# Patient Record
Sex: Male | Born: 1960
Health system: Southern US, Community
[De-identification: ages and names within clinical notes are randomized; demographics above are authoritative.]

## PROBLEM LIST (undated history)

## (undated) DIAGNOSIS — R251 Tremor, unspecified: Secondary | ICD-10-CM

## (undated) DIAGNOSIS — I10 Essential (primary) hypertension: Secondary | ICD-10-CM

## (undated) DIAGNOSIS — M1990 Unspecified osteoarthritis, unspecified site: Secondary | ICD-10-CM

## (undated) DIAGNOSIS — Z8619 Personal history of other infectious and parasitic diseases: Secondary | ICD-10-CM

## (undated) DIAGNOSIS — G4730 Sleep apnea, unspecified: Secondary | ICD-10-CM

## (undated) DIAGNOSIS — K219 Gastro-esophageal reflux disease without esophagitis: Secondary | ICD-10-CM

## (undated) DIAGNOSIS — T4145XA Adverse effect of unspecified anesthetic, initial encounter: Secondary | ICD-10-CM

## (undated) DIAGNOSIS — T8859XA Other complications of anesthesia, initial encounter: Secondary | ICD-10-CM

## (undated) DIAGNOSIS — N400 Enlarged prostate without lower urinary tract symptoms: Secondary | ICD-10-CM

## (undated) DIAGNOSIS — H269 Unspecified cataract: Secondary | ICD-10-CM

## (undated) DIAGNOSIS — R519 Headache, unspecified: Secondary | ICD-10-CM

## (undated) DIAGNOSIS — I639 Cerebral infarction, unspecified: Secondary | ICD-10-CM

## (undated) DIAGNOSIS — Z87442 Personal history of urinary calculi: Secondary | ICD-10-CM

## (undated) DIAGNOSIS — R51 Headache: Secondary | ICD-10-CM

## (undated) HISTORY — DX: Unspecified cataract: H26.9

## (undated) HISTORY — PX: REPLACEMENT TOTAL KNEE: SUR1224

## (undated) HISTORY — DX: Personal history of other infectious and parasitic diseases: Z86.19

## (undated) HISTORY — DX: Essential (primary) hypertension: I10

## (undated) HISTORY — DX: Sleep apnea, unspecified: G47.30

---

## 1992-08-09 HISTORY — PX: CYSTOSCOPY: SUR368

## 1998-08-09 DIAGNOSIS — Z91038 Other insect allergy status: Secondary | ICD-10-CM | POA: Insufficient documentation

## 1998-08-09 HISTORY — PX: VASECTOMY: SHX75

## 2004-10-01 ENCOUNTER — Ambulatory Visit: Payer: Self-pay | Admitting: Family Medicine

## 2004-10-12 ENCOUNTER — Ambulatory Visit: Payer: Self-pay | Admitting: Urology

## 2006-07-30 ENCOUNTER — Emergency Department: Payer: Self-pay | Admitting: Emergency Medicine

## 2007-03-06 ENCOUNTER — Ambulatory Visit: Payer: Self-pay | Admitting: Emergency Medicine

## 2007-08-11 ENCOUNTER — Emergency Department: Payer: Self-pay | Admitting: Emergency Medicine

## 2010-09-18 ENCOUNTER — Ambulatory Visit: Payer: Self-pay | Admitting: Family Medicine

## 2010-09-25 ENCOUNTER — Ambulatory Visit: Payer: Self-pay | Admitting: Family Medicine

## 2013-01-09 DIAGNOSIS — Z86010 Personal history of colonic polyps: Secondary | ICD-10-CM | POA: Insufficient documentation

## 2013-01-09 LAB — HM COLONOSCOPY

## 2013-03-06 ENCOUNTER — Ambulatory Visit: Payer: Self-pay | Admitting: Surgery

## 2013-03-06 LAB — CBC WITH DIFFERENTIAL/PLATELET
Basophil %: 0.6 %
Eosinophil #: 0.2 10*3/uL (ref 0.0–0.7)
Eosinophil %: 2.3 %
HCT: 40.5 % (ref 40.0–52.0)
HGB: 14 g/dL (ref 13.0–18.0)
Lymphocyte %: 27.5 %
MCH: 31.1 pg (ref 26.0–34.0)
Monocyte %: 5.7 %
Neutrophil %: 63.9 %
Platelet: 179 10*3/uL (ref 150–440)
RDW: 13.7 % (ref 11.5–14.5)
WBC: 7.5 10*3/uL (ref 3.8–10.6)

## 2013-03-06 LAB — BASIC METABOLIC PANEL
Anion Gap: 8 (ref 7–16)
BUN: 13 mg/dL (ref 7–18)
Chloride: 106 mmol/L (ref 98–107)
Co2: 27 mmol/L (ref 21–32)
EGFR (African American): >60
EGFR (Non-African Amer.): >60
Glucose: 110 mg/dL — ABNORMAL HIGH (ref 65–99)
Osmolality: 282 (ref 275–301)
Sodium: 141 mmol/L (ref 136–145)

## 2013-03-12 ENCOUNTER — Ambulatory Visit: Payer: Self-pay | Admitting: Surgery

## 2013-03-12 HISTORY — PX: HERNIA REPAIR: SHX51

## 2013-12-04 ENCOUNTER — Emergency Department: Payer: Self-pay | Admitting: Emergency Medicine

## 2013-12-04 LAB — COMPREHENSIVE METABOLIC PANEL
ALBUMIN: 4.3 g/dL (ref 3.4–5.0)
ALT: 28 U/L (ref 12–78)
Alkaline Phosphatase: 46 U/L
Anion Gap: 5 — ABNORMAL LOW (ref 7–16)
BUN: 12 mg/dL (ref 7–18)
Bilirubin,Total: 0.6 mg/dL (ref 0.2–1.0)
CHLORIDE: 105 mmol/L (ref 98–107)
CREATININE: 0.86 mg/dL (ref 0.60–1.30)
Calcium, Total: 9.2 mg/dL (ref 8.5–10.1)
Co2: 31 mmol/L (ref 21–32)
EGFR (African American): >60
EGFR (Non-African Amer.): >60
Glucose: 83 mg/dL (ref 65–99)
OSMOLALITY: 280 (ref 275–301)
Potassium: 3.6 mmol/L (ref 3.5–5.1)
SGOT(AST): 17 U/L (ref 15–37)
Sodium: 141 mmol/L (ref 136–145)
TOTAL PROTEIN: 7.6 g/dL (ref 6.4–8.2)

## 2013-12-04 LAB — CBC
HCT: 44.6 % (ref 40.0–52.0)
HGB: 14.5 g/dL (ref 13.0–18.0)
MCH: 29.6 pg (ref 26.0–34.0)
MCHC: 32.4 g/dL (ref 32.0–36.0)
MCV: 91 fL (ref 80–100)
Platelet: 201 10*3/uL (ref 150–440)
RBC: 4.89 10*6/uL (ref 4.40–5.90)
RDW: 13.5 % (ref 11.5–14.5)
WBC: 9.2 10*3/uL (ref 3.8–10.6)

## 2013-12-04 LAB — MAGNESIUM: Magnesium: 2 mg/dL

## 2013-12-04 LAB — TROPONIN I: Troponin-I: <0.02 ng/mL

## 2013-12-28 ENCOUNTER — Ambulatory Visit: Payer: Self-pay | Admitting: Family Medicine

## 2013-12-28 HISTORY — PX: CARDIOVASCULAR STRESS TEST: SHX262

## 2014-11-29 NOTE — Op Note (Signed)
PATIENT NAME:  Rodney Rojas, Rodney Rojas MR#:  [id number] DATE OF BIRTH:  March 24, 1961  DATE OF PROCEDURE:  03/12/2013  PREOPERATIVE DIAGNOSIS: Umbilical hernia.   POSTOPERATIVE DIAGNOSIS: Umbilical hernia.   OPERATION: Umbilical hernia repair.   ANESTHESIA: General, with LMA.   SURGEON: Dr. Pat Patrick.   ASSISTANT: Rainbolt, PA student. Luis Abed PA student.   ANESTHESIA: General.   OPERATIVE PROCEDURE: With the patient in the supine position after the induction of appropriate general anesthesia, the patient's abdomen was prepped with ChloraPrep and draped with sterile towels. An infraumbilical incision was made in the standard fashion, carried down bluntly through the subcutaneous tissue. The umbilical skin was separated from the sac and the sac was dissected back to its base along the fascia. The defect itself was quite small and had to be slightly enlarged to allow for decompression of the omentum and preperitoneal fat. No sac was removed for specimen.   The edges of the fascia were cleaned. The defect was then closed in a pants-over-vest closure using 0 Surgilon. The edges were then sutured over to provide a double-layer closure. The area was infiltrated with 0.25% Marcaine for postoperative pain control. Umbilical skin was reapproximated with 0 Vicryl and the skin was clipped. Sterile dressings were applied. The patient was returned to the recovery room, having tolerated the procedure well. Sponge, instrument and needle counts were correct x 2 in the operating room.     ____________________________ [name], MD rle:dm D: 03/12/2013 12:22:29 ET T: 03/12/2013 13:31:52 ET JOB#: [id number]  cc: Micheline Maze, MD, <Dictator> Kirstie Peri. Caryn Section, MD Rodena Goldmann MD ELECTRONICALLY SIGNED 03/13/2013 15:14

## 2015-02-24 ENCOUNTER — Encounter: Payer: Self-pay | Admitting: Family Medicine

## 2015-02-24 ENCOUNTER — Ambulatory Visit (INDEPENDENT_AMBULATORY_CARE_PROVIDER_SITE_OTHER): Payer: BLUE CROSS/BLUE SHIELD | Admitting: Family Medicine

## 2015-02-24 VITALS — BP 116/70 | HR 61 | Temp 98.1°F | Resp 16 | Ht 70.0 in | Wt 219.0 lb

## 2015-02-24 DIAGNOSIS — B356 Tinea cruris: Secondary | ICD-10-CM

## 2015-02-24 DIAGNOSIS — R3911 Hesitancy of micturition: Secondary | ICD-10-CM | POA: Insufficient documentation

## 2015-02-24 DIAGNOSIS — M25562 Pain in left knee: Secondary | ICD-10-CM

## 2015-02-24 DIAGNOSIS — K219 Gastro-esophageal reflux disease without esophagitis: Secondary | ICD-10-CM | POA: Insufficient documentation

## 2015-02-24 DIAGNOSIS — Z91038 Other insect allergy status: Secondary | ICD-10-CM | POA: Diagnosis not present

## 2015-02-24 DIAGNOSIS — G8929 Other chronic pain: Secondary | ICD-10-CM | POA: Insufficient documentation

## 2015-02-24 DIAGNOSIS — G250 Essential tremor: Secondary | ICD-10-CM | POA: Diagnosis not present

## 2015-02-24 MED ORDER — EPINEPHRINE 0.3 MG/0.3ML IJ SOAJ: 0.3000 mg | Freq: Once | INTRAMUSCULAR | Status: DC

## 2015-02-24 MED ORDER — PRIMIDONE 50 MG PO TABS: 50.0000 mg | ORAL_TABLET | Freq: Every evening | ORAL | Status: DC | PRN

## 2015-02-24 MED ORDER — CEPHALEXIN 500 MG PO CAPS: 500.0000 mg | ORAL_CAPSULE | Freq: Four times a day (QID) | ORAL | Status: AC

## 2015-02-24 MED ORDER — BISOPROLOL FUMARATE 10 MG PO TABS
10.0000 mg | ORAL_TABLET | Freq: Every day | ORAL | Status: DC
Start: 2015-02-24 — End: 2015-04-27

## 2015-02-24 NOTE — Progress Notes (Signed)
       Patient: Rodney Rojas Male    DOB: 29-Jul-1961   54 y.o.   MRN: [id number] Visit Date: 02/24/2015  Today's Provider: Lelon Huh, MD   Chief Complaint  Patient presents with  . Rash   Subjective:    Rash This is a new problem. The current episode started in the past 7 days. The problem is unchanged. The affected locations include the genitalia. The rash is characterized by itchiness and redness. Pertinent negatives include no fever or shortness of breath.   He states he has been traveling a lot, and got some time of chemical on his pants before rash started last week. It has been very itchy and he has been using Gold Bond powder which helps. He started using Lotrimin last night and hasn't noticed any difference yet. Rash affects the skin of his scrotal sac only, is not spreading and is not draining.   Follow up Tremors: States current combination of betablocker and primidone is working well and needs a prescription.   Allergies  Allergen Reactions  . Bee Venom     Bee stings   Previous Medications   BISOPROLOL (ZEBETA) 10 MG TABLET    Take 1 tablet by mouth daily.   EPINEPHRINE (EPIPEN 2-PAK) 0.3 MG/0.3 ML IJ SOAJ INJECTION    Inject 0.3 mg into the muscle once.   EPINEPHRINE, ANAPHYLAXIS THERAPY AGENTS,    1 injection  Used as directed by physician   PRIMIDONE (MYSOLINE) 50 MG TABLET    Take 1-2 tablets by mouth at bedtime as needed.   TADALAFIL (CIALIS) 5 MG TABLET    Take 1 tablet by mouth daily as needed.   TESTOSTERONE IM        Review of Systems  Constitutional: Negative for fever.  Respiratory: Negative for shortness of breath.   Cardiovascular: Negative for chest pain.  Skin: Positive for rash.    History  Substance Use Topics  . Smoking status: Never Smoker   . Smokeless tobacco: Not on file  . Alcohol Use: 0.0 oz/week    0 Standard drinks or equivalent per week     Comment: occasional use   Objective:   BP 116/70 mmHg  Pulse 61  Temp(Src)  98.1 F (36.7 C) (Oral)  Resp 16  Ht 5\' 10"  (1.778 m)  Wt 219 lb (99.338 kg)  BMI 31.42 kg/m2  SpO2 97%  Physical Exam Scrotal sac is erythematous as described in HPI. No discharge. No Satellite lesions.      Assessment & Plan:     1. Tinea cruris Continue OTC lotrimen and call if not resolved by end of week. Cover for possible secondary bacterial infection.  - cephALEXin (KEFLEX) 500 MG capsule; Take 1 capsule (500 mg total) by mouth 4 (four) times daily.  Dispense: 28 capsule; Refill: 0  2. Benign essential tremor well controlled Refill meds - primidone (MYSOLINE) 50 MG tablet; Take 1-2 tablets (50-100 mg total) by mouth at bedtime as needed.  Dispense: 60 tablet; Refill: 5 - bisoprolol (ZEBETA) 10 MG tablet; Take 1 tablet (10 mg total) by mouth daily.  Dispense: 30 tablet; Refill: 1  3. Allergy to insects and arachnids  - EPINEPHrine (EPIPEN 2-PAK) 0.3 mg/0.3 mL IJ SOAJ injection; Inject 0.3 mLs (0.3 mg total) into the muscle once.  Dispense: 2 Device; Refill: 1        Lelon Huh, MD  [facility]

## 2015-02-24 NOTE — Patient Instructions (Signed)
Continue using OTC Lotrimin cream for 7 days. Call if not much better by the end of the week.

## 2015-02-28 ENCOUNTER — Ambulatory Visit (INDEPENDENT_AMBULATORY_CARE_PROVIDER_SITE_OTHER): Payer: BLUE CROSS/BLUE SHIELD | Admitting: Family Medicine

## 2015-02-28 ENCOUNTER — Encounter: Payer: Self-pay | Admitting: Family Medicine

## 2015-02-28 VITALS — BP 102/60 | HR 51 | Temp 98.8°F | Resp 16 | Ht 70.0 in | Wt 219.0 lb

## 2015-02-28 DIAGNOSIS — B356 Tinea cruris: Secondary | ICD-10-CM

## 2015-02-28 MED ORDER — KETOCONAZOLE 2 % EX CREA: 1.0000 "application " | TOPICAL_CREAM | Freq: Every day | CUTANEOUS | Status: DC

## 2015-02-28 NOTE — Progress Notes (Signed)
       Patient: Rodney Rojas Male    DOB: 12-Mar-1961   54 y.o.   MRN: [id number] Visit Date: 02/28/2015  Today's Provider: Lelon Huh, MD   Chief Complaint  Patient presents with  . Follow-up  . Rash   Subjective:    Rash This is a recurrent problem. The current episode started in the past 7 days. The problem has been gradually improving since onset. The affected locations include the groin. The rash is characterized by bruising.    Follow-up for Rash (Tinea Cruris) in groin area from 02/24/2015. Patient to continue OTC Lotrimin started on Keflex to cover for secondary bacterial infection. He states that skin in scotum started to swell and itch even worse yesterday and the day before, but he got up this morning and everything was back to normal. He states he had been scratching the area a lot because it was itching so back, but the itching is also much bette today. He has been working out of town, but states he has been mostly in air-conditioned environment and not out in the heat. . Advised to call office if rash not better by the end of week.    Allergies  Allergen Reactions  . Bee Venom     Bee stings   Previous Medications   BISOPROLOL (ZEBETA) 10 MG TABLET    Take 1 tablet (10 mg total) by mouth daily.   CEPHALEXIN (KEFLEX) 500 MG CAPSULE    Take 1 capsule (500 mg total) by mouth 4 (four) times daily.   EPINEPHRINE (EPIPEN 2-PAK) 0.3 MG/0.3 ML IJ SOAJ INJECTION    Inject 0.3 mLs (0.3 mg total) into the muscle once.   EPINEPHRINE, ANAPHYLAXIS THERAPY AGENTS,    1 injection  Used as directed by physician   PRIMIDONE (MYSOLINE) 50 MG TABLET    Take 1-2 tablets (50-100 mg total) by mouth at bedtime as needed.   TADALAFIL (CIALIS) 5 MG TABLET    Take 1 tablet by mouth daily as needed.   TESTOSTERONE IM        Review of Systems  Cardiovascular: Negative for chest pain and palpitations.  Skin: Positive for rash.  Neurological: Negative for dizziness, light-headedness and  headaches.    History  Substance Use Topics  . Smoking status: Never Smoker   . Smokeless tobacco: Not on file  . Alcohol Use: 0.0 oz/week    0 Standard drinks or equivalent per week     Comment: occasional use   Objective:   BP 102/60 mmHg  Pulse 51  Temp(Src) 98.8 F (37.1 C) (Oral)  Resp 16  Ht 5\' 10"  (1.778 m)  Wt 219 lb (99.338 kg)  BMI 31.42 kg/m2  SpO2 97%  Physical Exam      Assessment & Plan:      1. Tinea cruris Symptoms resolved since yesterday. He is to finish cephalexin and continue lotrimin. Start ketoconazole if symptoms return.  - ketoconazole (NIZORAL) 2 % cream; Apply 1 application topically daily.  Dispense: 15 g; Refill: 0       Lelon Huh, MD  [facility]

## 2015-04-27 ENCOUNTER — Other Ambulatory Visit: Payer: Self-pay | Admitting: Family Medicine

## 2015-08-05 ENCOUNTER — Ambulatory Visit (INDEPENDENT_AMBULATORY_CARE_PROVIDER_SITE_OTHER): Payer: BLUE CROSS/BLUE SHIELD | Admitting: Family Medicine

## 2015-08-05 ENCOUNTER — Encounter: Payer: Self-pay | Admitting: Family Medicine

## 2015-08-05 VITALS — BP 142/80 | HR 58 | Temp 98.0°F | Resp 16 | Wt 220.0 lb

## 2015-08-05 DIAGNOSIS — I499 Cardiac arrhythmia, unspecified: Secondary | ICD-10-CM | POA: Diagnosis not present

## 2015-08-05 DIAGNOSIS — R002 Palpitations: Secondary | ICD-10-CM | POA: Diagnosis not present

## 2015-08-05 NOTE — Progress Notes (Signed)
Patient ID: Rodney Rojas, male   DOB: 06-Nov-1960, 54 y.o.   MRN: [id number]       Patient: Rodney Rojas Male    DOB: 03-Apr-1961   54 y.o.   MRN: [id number] Visit Date: 08/05/2015  Today's Provider: Lelon Huh, MD   Chief Complaint  Patient presents with  . Irregular Heart Beat    with chest discomfort   Subjective:    HPI Pt reports that 2 days ago he started having this " funny" feeling in his chest. " Almost like a palpitation" He reports that he had been lifting a lot of trees and branches and thought it was muscular at first. Then one night he could not get comfortable and he started to go to the ER but he ended up getting comfortable and going to sleep. The pain/discomfort is located on the left side of his chest. He also reports that he has been very thirsty and burping a lot, even when he drinks water. He denies any shortness of breath, sweats, arm pain, jaw pain etc. He has no history of heart related problems, but he does have a family history, his father had CABG x3 at about 28.    Allergies  Allergen Reactions  . Bee Venom     Bee stings   Previous Medications   BISOPROLOL (ZEBETA) 10 MG TABLET    take 1 tablet by mouth once daily   EPINEPHRINE (EPIPEN 2-PAK) 0.3 MG/0.3 ML IJ SOAJ INJECTION    Inject 0.3 mLs (0.3 mg total) into the muscle once.   KETOCONAZOLE (NIZORAL) 2 % CREAM    Apply 1 application topically daily.   PRIMIDONE (MYSOLINE) 50 MG TABLET    Take 1-2 tablets (50-100 mg total) by mouth at bedtime as needed.   TADALAFIL (CIALIS) 5 MG TABLET    Take 1 tablet by mouth daily as needed.   TAMSULOSIN (FLOMAX) 0.4 MG CAPS CAPSULE    take 1 capsule by mouth once daily 1/2 HOUR after A MEAL   TESTOSTERONE IM    Reported on 08/05/2015    Review of Systems  Constitutional: Negative.   HENT: Negative.   Eyes: Negative.   Respiratory: Negative.   Cardiovascular: Positive for chest pain.  Gastrointestinal:       Heart burn  Endocrine: Negative.     Genitourinary: Negative.   Musculoskeletal: Negative.   Skin: Negative.   Allergic/Immunologic: Negative.   Neurological: Positive for tremors.  Hematological: Negative.   Psychiatric/Behavioral: Negative.     Social History  Substance Use Topics  . Smoking status: Never Smoker   . Smokeless tobacco: Not on file  . Alcohol Use: 0.0 oz/week    0 Standard drinks or equivalent per week     Comment: occasional use   Objective:   BP 142/80 mmHg  Pulse 58  Temp(Src) 98 F (36.7 C) (Oral)  Resp 16  Wt 220 lb (99.791 kg)  Physical Exam   General Appearance:    Alert, cooperative, no distress  Eyes:    PERRL, conjunctiva/corneas clear, EOM's intact       Lungs:     Clear to auscultation bilaterally, respirations unlabored  Heart:    Regular rate and rhythm  Neurologic:   Awake, alert, oriented x 3. No apparent focal neurological           defect.      ECG: Sinus  Bradycardia  - occasional ectopic ventricular beat    WITHIN NORMAL LIMITS  Assessment & Plan:     1. Intermittent palpitations  - EKG 12-Lead  24 hour Holter monitor.      Lelon Huh, MD  [facility]

## 2015-08-08 ENCOUNTER — Ambulatory Visit
Admission: RE | Admit: 2015-08-08 | Discharge: 2015-08-08 | Disposition: A | Discharge status: Home / Self Care | Payer: BLUE CROSS/BLUE SHIELD | Source: Ambulatory Visit | Attending: Family Medicine | Admitting: Family Medicine

## 2015-08-08 ENCOUNTER — Ambulatory Visit: Admission: RE | Admit: 2015-08-08 | Payer: BLUE CROSS/BLUE SHIELD | Source: Ambulatory Visit

## 2015-08-08 ENCOUNTER — Inpatient Hospital Stay: Admission: RE | Admit: 2015-08-08 | Payer: BLUE CROSS/BLUE SHIELD | Source: Ambulatory Visit

## 2015-08-08 DIAGNOSIS — R002 Palpitations: Secondary | ICD-10-CM | POA: Diagnosis not present

## 2015-08-11 ENCOUNTER — Emergency Department
Admission: EM | Admit: 2015-08-11 | Discharge: 2015-08-11 | Disposition: A | Discharge status: Home / Self Care | Payer: BLUE CROSS/BLUE SHIELD | Attending: Emergency Medicine | Admitting: Emergency Medicine

## 2015-08-11 DIAGNOSIS — R109 Unspecified abdominal pain: Secondary | ICD-10-CM | POA: Insufficient documentation

## 2015-08-11 DIAGNOSIS — R197 Diarrhea, unspecified: Secondary | ICD-10-CM | POA: Diagnosis not present

## 2015-08-11 DIAGNOSIS — R112 Nausea with vomiting, unspecified: Secondary | ICD-10-CM

## 2015-08-11 DIAGNOSIS — Z79899 Other long term (current) drug therapy: Secondary | ICD-10-CM | POA: Diagnosis not present

## 2015-08-11 LAB — CBC
HEMATOCRIT: 42 % (ref 40.0–52.0)
HEMOGLOBIN: 14.3 g/dL (ref 13.0–18.0)
MCH: 31.1 pg (ref 26.0–34.0)
MCHC: 34 g/dL (ref 32.0–36.0)
MCV: 91.4 fL (ref 80.0–100.0)
PLATELETS: 146 10*3/uL — AB (ref 150–440)
RBC: 4.59 MIL/uL (ref 4.40–5.90)
RDW: 13.5 % (ref 11.5–14.5)
WBC: 10.8 10*3/uL — AB (ref 3.8–10.6)

## 2015-08-11 LAB — COMPREHENSIVE METABOLIC PANEL
ALBUMIN: 4.9 g/dL (ref 3.5–5.0)
ALK PHOS: 38 U/L (ref 38–126)
ALT: 21 U/L (ref 17–63)
ANION GAP: 9 (ref 5–15)
AST: 22 U/L (ref 15–41)
BILIRUBIN TOTAL: 1.5 mg/dL — AB (ref 0.3–1.2)
BUN: 17 mg/dL (ref 6–20)
CALCIUM: 9.1 mg/dL (ref 8.9–10.3)
CO2: 24 mmol/L (ref 22–32)
Chloride: 105 mmol/L (ref 101–111)
Creatinine, Ser: 0.86 mg/dL (ref 0.61–1.24)
GFR calc Af Amer: >60 mL/min (ref >60)
GLUCOSE: 145 mg/dL — AB (ref 65–99)
Potassium: 4 mmol/L (ref 3.5–5.1)
Sodium: 138 mmol/L (ref 135–145)
TOTAL PROTEIN: 7.4 g/dL (ref 6.5–8.1)

## 2015-08-11 LAB — LIPASE, BLOOD: Lipase: 22 U/L (ref 11–51)

## 2015-08-11 MED ORDER — ONDANSETRON 4 MG PO TBDP
4.0000 mg | ORAL_TABLET | Freq: Once | ORAL | Status: AC | PRN
  Administered 2015-08-11: 4 mg via ORAL

## 2015-08-11 MED ORDER — ONDANSETRON 4 MG PO TBDP
ORAL_TABLET | ORAL | Status: AC
  Administered 2015-08-11: 4 mg via ORAL
  Filled 2015-08-11: qty 1

## 2015-08-11 MED ORDER — ONDANSETRON 4 MG PO TBDP: 4.0000 mg | ORAL_TABLET | Freq: Three times a day (TID) | ORAL | Status: DC | PRN

## 2015-08-11 MED ORDER — SODIUM CHLORIDE 0.9 % IV BOLUS (SEPSIS)
1000.0000 mL | Freq: Once | INTRAVENOUS | Status: AC
  Administered 2015-08-11: 1000 mL via INTRAVENOUS

## 2015-08-11 NOTE — ED Notes (Signed)
Pt reports n/v/d x 7 hours.  Pt reports vomiting x 7 and diarrhea x 7.  Pt reports weakness and dizziness directly after episodes, pt currently feeling better.  Pt reports mild headache at this time.  Pt reports last episode of vomiting and diarrhea around 11:30pm.  Pt NAD at this time, respirations equal and unlabored, skin warm and dry.

## 2015-08-11 NOTE — ED Notes (Addendum)
Patient ambulatory to triage with steady gait, without difficulty or distress noted; pt reports N/V/D since 530pm with intermittent lower abd cramping

## 2015-08-11 NOTE — Discharge Instructions (Signed)
1. Take nausea medicine as needed (Zofran #20). 2. Clear liquids x 12 hours, then bland diet x 3 days, then slowly advance diet as tolerated. 3. Return to the ER for worsening symptoms, persistent vomiting, fever, difficulty breathing or other concerns.  Nausea and Vomiting Nausea is a sick feeling that often comes before throwing up (vomiting). Vomiting is a reflex where stomach contents come out of your mouth. Vomiting can cause severe loss of body fluids (dehydration). Children and elderly adults can become dehydrated quickly, especially if they also have diarrhea. Nausea and vomiting are symptoms of a condition or disease. It is important to find the cause of your symptoms. CAUSES   Direct irritation of the stomach lining. This irritation can result from increased acid production (gastroesophageal reflux disease), infection, food poisoning, taking certain medicines (such as nonsteroidal anti-inflammatory drugs), alcohol use, or tobacco use.  Signals from the brain.These signals could be caused by a headache, heat exposure, an inner ear disturbance, increased pressure in the brain from injury, infection, a tumor, or a concussion, pain, emotional stimulus, or metabolic problems.  An obstruction in the gastrointestinal tract (bowel obstruction).  Illnesses such as diabetes, hepatitis, gallbladder problems, appendicitis, kidney problems, cancer, sepsis, atypical symptoms of a heart attack, or eating disorders.  Medical treatments such as chemotherapy and radiation.  Receiving medicine that makes you sleep (general anesthetic) during surgery. DIAGNOSIS Your caregiver may ask for tests to be done if the problems do not improve after a few days. Tests may also be done if symptoms are severe or if the reason for the nausea and vomiting is not clear. Tests may include:  Urine tests.  Blood tests.  Stool tests.  Cultures (to look for evidence of infection).  X-rays or other imaging  studies. Test results can help your caregiver make decisions about treatment or the need for additional tests. TREATMENT You need to stay well hydrated. Drink frequently but in small amounts.You may wish to drink water, sports drinks, clear broth, or eat frozen ice pops or gelatin dessert to help stay hydrated.When you eat, eating slowly may help prevent nausea.There are also some antinausea medicines that may help prevent nausea. HOME CARE INSTRUCTIONS   Take all medicine as directed by your caregiver.  If you do not have an appetite, do not force yourself to eat. However, you must continue to drink fluids.  If you have an appetite, eat a normal diet unless your caregiver tells you differently.  Eat a variety of complex carbohydrates (rice, wheat, potatoes, bread), lean meats, yogurt, fruits, and vegetables.  Avoid high-fat foods because they are more difficult to digest.  Drink enough water and fluids to keep your urine clear or pale yellow.  If you are dehydrated, ask your caregiver for specific rehydration instructions. Signs of dehydration may include:  Severe thirst.  Dry lips and mouth.  Dizziness.  Dark urine.  Decreasing urine frequency and amount.  Confusion.  Rapid breathing or pulse. SEEK IMMEDIATE MEDICAL CARE IF:   You have blood or brown flecks (like coffee grounds) in your vomit.  You have black or bloody stools.  You have a severe headache or stiff neck.  You are confused.  You have severe abdominal pain.  You have chest pain or trouble breathing.  You do not urinate at least once every 8 hours.  You develop cold or clammy skin.  You continue to vomit for longer than 24 to 48 hours.  You have a fever. MAKE SURE YOU:  Understand these instructions.  Will watch your condition.  Will get help right away if you are not doing well or get worse.   This information is not intended to replace advice given to you by your health care provider.  Make sure you discuss any questions you have with your health care provider.   Document Released: 07/26/2005 Document Revised: 10/18/2011 Document Reviewed: 12/23/2010 Elsevier Interactive Patient Education 2016 [employer].  Diarrhea Diarrhea is frequent loose and watery bowel movements. It can cause you to feel weak and dehydrated. Dehydration can cause you to become tired and thirsty, have a dry mouth, and have decreased urination that often is dark yellow. Diarrhea is a sign of another problem, most often an infection that will not last long. In most cases, diarrhea typically lasts 2-3 days. However, it can last longer if it is a sign of something more serious. It is important to treat your diarrhea as directed by your caregiver to lessen or prevent future episodes of diarrhea. CAUSES  Some common causes include:  Gastrointestinal infections caused by viruses, bacteria, or parasites.  Food poisoning or food allergies.  Certain medicines, such as antibiotics, chemotherapy, and laxatives.  Artificial sweeteners and fructose.  Digestive disorders. HOME CARE INSTRUCTIONS  Ensure adequate fluid intake (hydration): Have 1 cup (8 oz) of fluid for each diarrhea episode. Avoid fluids that contain simple sugars or sports drinks, fruit juices, whole milk products, and sodas. Your urine should be clear or pale yellow if you are drinking enough fluids. Hydrate with an oral rehydration solution that you can purchase at pharmacies, retail stores, and online. You can prepare an oral rehydration solution at home by mixing the following ingredients together:   - tsp table salt.   tsp baking soda.   tsp salt substitute containing potassium chloride.  1  tablespoons sugar.  1 L (34 oz) of water.  Certain foods and beverages may increase the speed at which food moves through the gastrointestinal (GI) tract. These foods and beverages should be avoided and include:  Caffeinated and alcoholic  beverages.  High-fiber foods, such as raw fruits and vegetables, nuts, seeds, and whole grain breads and cereals.  Foods and beverages sweetened with sugar alcohols, such as xylitol, sorbitol, and mannitol.  Some foods may be well tolerated and may help thicken stool including:  Starchy foods, such as rice, toast, pasta, low-sugar cereal, oatmeal, grits, baked potatoes, crackers, and bagels.  Bananas.  Applesauce.  Add probiotic-rich foods to help increase healthy bacteria in the GI tract, such as yogurt and fermented milk products.  Wash your hands well after each diarrhea episode.  Only take over-the-counter or prescription medicines as directed by your caregiver.  Take a warm bath to relieve any burning or pain from frequent diarrhea episodes. SEEK IMMEDIATE MEDICAL CARE IF:   You are unable to keep fluids down.  You have persistent vomiting.  You have blood in your stool, or your stools are black and tarry.  You do not urinate in 6-8 hours, or there is only a small amount of very dark urine.  You have abdominal pain that increases or localizes.  You have weakness, dizziness, confusion, or light-headedness.  You have a severe headache.  Your diarrhea gets worse or does not get better.  You have a fever or persistent symptoms for more than 2-3 days.  You have a fever and your symptoms suddenly get worse. MAKE SURE YOU:   Understand these instructions.  Will watch your condition.  Will  get help right away if you are not doing well or get worse.   This information is not intended to replace advice given to you by your health care provider. Make sure you discuss any questions you have with your health care provider.   Document Released: 07/16/2002 Document Revised: 08/16/2014 Document Reviewed: 04/02/2012 Elsevier Interactive Patient Education 2016 [employer] Choices to Help Relieve Diarrhea, Adult When you have diarrhea, the foods you eat and your  eating habits are very important. Choosing the right foods and drinks can help relieve diarrhea. Also, because diarrhea can last up to 7 days, you need to replace lost fluids and electrolytes (such as sodium, potassium, and chloride) in order to help prevent dehydration.  WHAT GENERAL GUIDELINES DO I NEED TO FOLLOW?  Slowly drink 1 cup (8 oz) of fluid for each episode of diarrhea. If you are getting enough fluid, your urine will be clear or pale yellow.  Eat starchy foods. Some good choices include white rice, white toast, pasta, low-fiber cereal, baked potatoes (without the skin), saltine crackers, and bagels.  Avoid large servings of any cooked vegetables.  Limit fruit to two servings per day. A serving is  cup or 1 small piece.  Choose foods with less than 2 g of fiber per serving.  Limit fats to less than 8 tsp (38 g) per day.  Avoid fried foods.  Eat foods that have probiotics in them. Probiotics can be found in certain dairy products.  Avoid foods and beverages that may increase the speed at which food moves through the stomach and intestines (gastrointestinal tract). Things to avoid include:  High-fiber foods, such as dried fruit, raw fruits and vegetables, nuts, seeds, and whole grain foods.  Spicy foods and high-fat foods.  Foods and beverages sweetened with high-fructose corn syrup, honey, or sugar alcohols such as xylitol, sorbitol, and mannitol. WHAT FOODS ARE RECOMMENDED? Grains White rice. White, [place], or pita breads (fresh or toasted), including plain rolls, buns, or bagels. White pasta. Saltine, soda, or graham crackers. Pretzels. Low-fiber cereal. Cooked cereals made with water (such as cornmeal, farina, or cream cereals). Plain muffins. Matzo. Melba toast. [place].  Vegetables Potatoes (without the skin). Strained tomato and vegetable juices. Most well-cooked and canned vegetables without seeds. Tender lettuce. Fruits Cooked or canned applesauce, apricots,  cherries, fruit cocktail, grapefruit, peaches, pears, or plums. Fresh bananas, apples without skin, cherries, grapes, cantaloupe, grapefruit, peaches, oranges, or plums.  Meat and Other Protein Products Baked or boiled chicken. Eggs. Tofu. Fish. Seafood. Smooth peanut butter. Ground or well-cooked tender beef, ham, veal, lamb, pork, or poultry.  Dairy Plain yogurt, kefir, and unsweetened liquid yogurt. Lactose-free milk, buttermilk, or soy milk. Plain hard cheese. Beverages Sport drinks. Clear broths. Diluted fruit juices (except prune). Regular, caffeine-free sodas such as ginger ale. Water. Decaffeinated teas. Oral rehydration solutions. Sugar-free beverages not sweetened with sugar alcohols. Other Bouillon, broth, or soups made from recommended foods.  The items listed above may not be a complete list of recommended foods or beverages. Contact your dietitian for more options. WHAT FOODS ARE NOT RECOMMENDED? Grains Whole grain, whole wheat, bran, or rye breads, rolls, pastas, crackers, and cereals. Wild or brown rice. Cereals that contain more than 2 g of fiber per serving. Corn tortillas or taco shells. Cooked or dry oatmeal. Granola. Popcorn. Vegetables Raw vegetables. Cabbage, broccoli, Brussels sprouts, artichokes, baked beans, beet greens, corn, kale, legumes, peas, sweet potatoes, and yams. Potato skins. Cooked spinach and cabbage. Fruits Dried fruit,  including raisins and dates. Raw fruits. Stewed or dried prunes. Fresh apples with skin, apricots, mangoes, pears, raspberries, and strawberries.  Meat and Other Protein Products Chunky peanut butter. Nuts and seeds. Beans and lentils. Berniece Salines.  Dairy High-fat cheeses. Milk, chocolate milk, and beverages made with milk, such as milk shakes. Cream. Ice cream. Sweets and Desserts Sweet rolls, doughnuts, and sweet breads. Pancakes and waffles. Fats and Oils Butter. Cream sauces. Margarine. Salad oils. Plain salad dressings. Olives. Avocados.   Beverages Caffeinated beverages (such as coffee, tea, soda, or energy drinks). Alcoholic beverages. Fruit juices with pulp. Prune juice. Soft drinks sweetened with high-fructose corn syrup or sugar alcohols. Other Coconut. Hot sauce. Chili powder. Mayonnaise. Gravy. Cream-based or milk-based soups.  The items listed above may not be a complete list of foods and beverages to avoid. Contact your dietitian for more information. WHAT SHOULD I DO IF I BECOME DEHYDRATED? Diarrhea can sometimes lead to dehydration. Signs of dehydration include dark urine and dry mouth and skin. If you think you are dehydrated, you should rehydrate with an oral rehydration solution. These solutions can be purchased at pharmacies, retail stores, or online.  Drink -1 cup (120-240 mL) of oral rehydration solution each time you have an episode of diarrhea. If drinking this amount makes your diarrhea worse, try drinking smaller amounts more often. For example, drink 1-3 tsp (5-15 mL) every 5-10 minutes.  A general rule for staying hydrated is to drink 1-2 L of fluid per day. Talk to your health care provider about the specific amount you should be drinking each day. Drink enough fluids to keep your urine clear or pale yellow.   This information is not intended to replace advice given to you by your health care provider. Make sure you discuss any questions you have with your health care provider.   Document Released: 10/16/2003 Document Revised: 08/16/2014 Document Reviewed: 06/18/2013 Elsevier Interactive Patient Education [employer].

## 2015-08-11 NOTE — ED Provider Notes (Signed)
[facility] Emergency Department Provider Note  ____________________________________________  Time seen: Approximately 2:51 AM  I have reviewed the triage vital signs and the nursing notes.   HISTORY  Chief Complaint Emesis    HPI Rodney Rojas is a 55 y.o. male presents to the ED from home with a chief complain of nausea, vomiting and diarrhea. Patient reports onset of vomiting and diarrhea simultaneously approximately 5 PM. He has experienced 7 episodes of each, last approximately 11:30 PM. Reports abdominal cramping immediately beforehand and feels better after he vomits/has a loose bowel movement.+ sick contacts. Denies recent travel or trauma. Denies associated fever, chills, chest pain, shortness of breath, dysuria.   Past Medical History  Diagnosis Date  . History of chicken pox     Patient Active Problem List   Diagnosis Date Noted  . Chronic pain of left knee 02/24/2015  . GERD (gastroesophageal reflux disease) 02/24/2015  . Urinary hesitancy 02/24/2015  . Fam hx-ischem heart disease 09/01/2009  . Family history of prostate cancer 09/01/2009  . Umbilical hernia without obstruction and without gangrene 09/01/2009  . Benign essential tremor 01/14/2009  . Allergy to insects and arachnids 08/09/1998    Past Surgical History  Procedure Laterality Date  . Hernia repair  0000000    umbilical hernia repair; Dr. Pat Patrick  . Vasectomy  2000  . Cystoscopy  1994  . Cardiovascular stress test  12/28/2013    [facility]; [facility]    Current Outpatient Rx  Name  Route  Sig  Dispense  Refill  . bisoprolol (ZEBETA) 10 MG tablet      take 1 tablet by mouth once daily   30 tablet   5   . EPINEPHrine (EPIPEN 2-PAK) 0.3 mg/0.3 mL IJ SOAJ injection   Intramuscular   Inject 0.3 mLs (0.3 mg total) into the muscle once.   2 Device   1   . ketoconazole (NIZORAL) 2 % cream   Topical   Apply 1 application topically daily.   15 g   0   . primidone  (MYSOLINE) 50 MG tablet   Oral   Take 1-2 tablets (50-100 mg total) by mouth at bedtime as needed.   60 tablet   5   . tadalafil (CIALIS) 5 MG tablet   Oral   Take 1 tablet by mouth daily as needed.         . tamsulosin (FLOMAX) 0.4 MG CAPS capsule      take 1 capsule by mouth once daily 1/2 HOUR after A MEAL      0   . TESTOSTERONE IM      Reported on 08/05/2015           Allergies Bee venom  Family History  Problem Relation Age of Onset  . Heart Problems Father     had 3V CABG in his 36's  . Diabetes Father   . Hypertension Father   . Cirrhosis Father     Social History Social History  Substance Use Topics  . Smoking status: Never Smoker   . Smokeless tobacco: Not on file  . Alcohol Use: 0.0 oz/week    0 Standard drinks or equivalent per week     Comment: occasional use    Review of Systems Constitutional: No fever/chills Eyes: No visual changes. ENT: No sore throat. Cardiovascular: Denies chest pain. Respiratory: Denies shortness of breath. Gastrointestinal: No abdominal pain.  Positive for nausea, vomiting and diarrhea.  No constipation. Genitourinary: Negative for dysuria. Musculoskeletal: Negative  for back pain. Skin: Negative for rash. Neurological: Negative for headaches, focal weakness or numbness.  10-point ROS otherwise negative.  ____________________________________________   PHYSICAL EXAM:  VITAL SIGNS: ED Triage Vitals  Enc Vitals Group     BP 08/11/15 0003 159/91 mmHg     Pulse Rate 08/11/15 0003 64     Resp 08/11/15 0003 18     Temp 08/11/15 0003 97.5 F (36.4 C)     Temp Source 08/11/15 0003 Oral     SpO2 08/11/15 0003 95 %     Weight 08/11/15 0003 220 lb (99.791 kg)     Height 08/11/15 0003 5\' 10"  (1.778 m)     Head Cir --      Peak Flow --      Pain Score 08/11/15 0003 7     Pain Loc --      Pain Edu? --      Excl. in [place]? --     Constitutional: Alert and oriented. Well appearing and in no acute distress. Eyes:  Conjunctivae are normal. PERRL. EOMI. Head: Atraumatic. Nose: No congestion/rhinnorhea. Mouth/Throat: Mucous membranes are mildly dry.  Oropharynx non-erythematous. Neck: No stridor.   Cardiovascular: Normal rate, regular rhythm. Grossly normal heart sounds.  Good peripheral circulation. Respiratory: Normal respiratory effort.  No retractions. Lungs CTAB. Gastrointestinal: Soft and nontender in all quadrants. No distention. No abdominal bruits. No CVA tenderness. Musculoskeletal: No lower extremity tenderness nor edema.  No joint effusions. Neurologic:  Normal speech and language. No gross focal neurologic deficits are appreciated. No gait instability. Skin:  Skin is warm, and intact. No rash noted. Psychiatric: Mood and affect are normal. Speech and behavior are normal.  ____________________________________________   LABS (all labs ordered are listed, but only abnormal results are displayed)  Labs Reviewed  COMPREHENSIVE METABOLIC PANEL - Abnormal; Notable for the following:    Glucose, Bld 145 (*)    Total Bilirubin 1.5 (*)    All other components within normal limits  CBC - Abnormal; Notable for the following:    WBC 10.8 (*)    Platelets 146 (*)    All other components within normal limits  LIPASE, BLOOD  URINALYSIS COMPLETEWITH MICROSCOPIC ([facility] ONLY)   ____________________________________________  EKG  None ____________________________________________  RADIOLOGY  None ____________________________________________   PROCEDURES  Procedure(s) performed: None  Critical Care performed: No  ____________________________________________   INITIAL IMPRESSION / ASSESSMENT AND PLAN / ED COURSE  Pertinent labs & imaging results that were available during my care of the patient were reviewed by me and considered in my medical decision making (see chart for details).  55 year old male who presents with nausea, vomiting and diarrhea. He is feeling much better without  intervention, currently eating ice chips. We will infuse 1 L IV normal saline for mild dehydration noted on clinical exam. Noted elevated bilirubin which is most likely secondary to several bouts of emesis as patient is nontender on his abdominal exam.  ----------------------------------------- 4:10 AM on 08/11/2015 -----------------------------------------  Patient is much improved. Tolerated PO emesis. Strict return precautions given. Patient and spouse verbalize understanding and agree with plan of care. ____________________________________________   FINAL CLINICAL IMPRESSION(S) / ED DIAGNOSES  Final diagnoses:  Non-intractable vomiting with nausea, vomiting of unspecified type  Diarrhea, unspecified type      Paulette Blanch, MD 08/11/15 [phone]

## 2015-08-12 ENCOUNTER — Ambulatory Visit
Admission: RE | Admit: 2015-08-12 | Discharge: 2015-08-12 | Disposition: A | Discharge status: Home / Self Care | Payer: BLUE CROSS/BLUE SHIELD | Source: Ambulatory Visit | Attending: Family Medicine | Admitting: Family Medicine

## 2015-08-12 DIAGNOSIS — R002 Palpitations: Secondary | ICD-10-CM

## 2015-08-12 DIAGNOSIS — Z029 Encounter for administrative examinations, unspecified: Secondary | ICD-10-CM | POA: Insufficient documentation

## 2015-08-19 ENCOUNTER — Encounter: Payer: Self-pay | Admitting: Respiratory Therapy

## 2015-10-28 ENCOUNTER — Other Ambulatory Visit: Payer: Self-pay | Admitting: Orthopedic Surgery

## 2015-10-28 DIAGNOSIS — M1711 Unilateral primary osteoarthritis, right knee: Secondary | ICD-10-CM

## 2015-10-28 DIAGNOSIS — M25561 Pain in right knee: Secondary | ICD-10-CM

## 2015-10-29 ENCOUNTER — Other Ambulatory Visit: Payer: Self-pay | Admitting: Family Medicine

## 2015-11-19 ENCOUNTER — Ambulatory Visit
Admission: RE | Admit: 2015-11-19 | Discharge: 2015-11-19 | Disposition: A | Discharge status: Home / Self Care | Payer: BLUE CROSS/BLUE SHIELD | Source: Ambulatory Visit | Attending: Orthopedic Surgery | Admitting: Orthopedic Surgery

## 2015-11-19 DIAGNOSIS — S83241A Other tear of medial meniscus, current injury, right knee, initial encounter: Secondary | ICD-10-CM | POA: Diagnosis not present

## 2015-11-19 DIAGNOSIS — M948X6 Other specified disorders of cartilage, lower leg: Secondary | ICD-10-CM | POA: Insufficient documentation

## 2015-11-19 DIAGNOSIS — M1711 Unilateral primary osteoarthritis, right knee: Secondary | ICD-10-CM | POA: Diagnosis not present

## 2015-11-19 DIAGNOSIS — M25561 Pain in right knee: Secondary | ICD-10-CM | POA: Insufficient documentation

## 2015-11-19 DIAGNOSIS — M25461 Effusion, right knee: Secondary | ICD-10-CM | POA: Diagnosis not present

## 2015-11-19 DIAGNOSIS — M2391 Unspecified internal derangement of right knee: Secondary | ICD-10-CM | POA: Diagnosis present

## 2015-11-19 DIAGNOSIS — S83231A Complex tear of medial meniscus, current injury, right knee, initial encounter: Secondary | ICD-10-CM | POA: Diagnosis not present

## 2015-12-01 ENCOUNTER — Encounter: Payer: Self-pay | Admitting: Family Medicine

## 2015-12-01 ENCOUNTER — Ambulatory Visit (INDEPENDENT_AMBULATORY_CARE_PROVIDER_SITE_OTHER): Payer: BLUE CROSS/BLUE SHIELD | Admitting: Family Medicine

## 2015-12-01 VITALS — BP 130/76 | HR 53 | Temp 97.9°F | Resp 16 | Wt 224.0 lb

## 2015-12-01 DIAGNOSIS — L03311 Cellulitis of abdominal wall: Secondary | ICD-10-CM

## 2015-12-01 MED ORDER — DOXYCYCLINE HYCLATE 100 MG PO TABS: 100.0000 mg | ORAL_TABLET | Freq: Two times a day (BID) | ORAL | Status: AC

## 2015-12-01 NOTE — Progress Notes (Signed)
       Patient: Rodney Rojas Male    DOB: 04/20/1961   55 y.o.   MRN: [id number] Visit Date: 12/01/2015  Today's Provider: Lelon Huh, MD   Chief Complaint  Patient presents with  . Insect Bite   Subjective:    HPI Tick Bite: Patient comes in today stating he was bitten by a tic over 1 week ago in his lower abdomen. Patient now has an itchy rash in that location. Patient states he may have been bitten by something else on his left side also. He has 2 marks and a red itchy rash on his left side. Patient has tried using Cortisone 10 and soaking in an oatmeal bath to help relieve itching. Patient states these remedies have provided no relied. Tick was small with a few white spots on its back.     Allergies  Allergen Reactions  . Bee Venom     Bee stings   Previous Medications   BISOPROLOL (ZEBETA) 10 MG TABLET    take 1 tablet by mouth once daily   EPINEPHRINE (EPIPEN 2-PAK) 0.3 MG/0.3 ML IJ SOAJ INJECTION    Inject 0.3 mLs (0.3 mg total) into the muscle once.   ONDANSETRON (ZOFRAN ODT) 4 MG DISINTEGRATING TABLET    Take 1 tablet (4 mg total) by mouth every 8 (eight) hours as needed for nausea or vomiting.   PRIMIDONE (MYSOLINE) 50 MG TABLET    Take 1-2 tablets (50-100 mg total) by mouth at bedtime as needed.   TADALAFIL (CIALIS) 5 MG TABLET    Take 1 tablet by mouth daily as needed.   TAMSULOSIN (FLOMAX) 0.4 MG CAPS CAPSULE    take 1 capsule by mouth once daily 1/2 HOUR after A MEAL   TESTOSTERONE IM    Reported on 08/05/2015    Review of Systems  Constitutional: Negative for fever, chills, appetite change and fatigue.  Respiratory: Negative for chest tightness, shortness of breath and wheezing.   Cardiovascular: Negative for chest pain and palpitations.  Gastrointestinal: Negative for nausea, vomiting and abdominal pain.  Skin: Positive for color change and rash.       Itchy skin    Social History  Substance Use Topics  . Smoking status: Never Smoker   . Smokeless  tobacco: Not on file  . Alcohol Use: 0.0 oz/week    0 Standard drinks or equivalent per week     Comment: occasional use   Objective:   BP 130/76 mmHg  Pulse 53  Temp(Src) 97.9 F (36.6 C) (Oral)  Resp 16  Wt 224 lb (101.606 kg)  SpO2 97%  Physical Exam  Derm: about 4cm irregular macular pathy of erythema about midline of abdomen, similar area left side around belt line.     Assessment & Plan:     1. Cellulitis of abdominal wall  - doxycycline (VIBRA-TABS) 100 MG tablet; Take 1 tablet (100 mg total) by mouth 2 (two) times daily.  Dispense: 14 tablet; Refill: 1   Call if symptoms change or if not rapidly improving.          Lelon Huh, MD  [facility]

## 2015-12-02 DIAGNOSIS — M1711 Unilateral primary osteoarthritis, right knee: Secondary | ICD-10-CM | POA: Diagnosis not present

## 2015-12-18 DIAGNOSIS — M1711 Unilateral primary osteoarthritis, right knee: Secondary | ICD-10-CM | POA: Diagnosis not present

## 2016-01-06 DIAGNOSIS — M1711 Unilateral primary osteoarthritis, right knee: Secondary | ICD-10-CM | POA: Diagnosis not present

## 2016-01-06 DIAGNOSIS — M25561 Pain in right knee: Secondary | ICD-10-CM | POA: Diagnosis not present

## 2016-01-06 DIAGNOSIS — M791 Myalgia: Secondary | ICD-10-CM | POA: Diagnosis not present

## 2016-01-15 DIAGNOSIS — M25361 Other instability, right knee: Secondary | ICD-10-CM | POA: Diagnosis not present

## 2016-01-15 DIAGNOSIS — M21161 Varus deformity, not elsewhere classified, right knee: Secondary | ICD-10-CM | POA: Diagnosis not present

## 2016-01-30 DIAGNOSIS — M1711 Unilateral primary osteoarthritis, right knee: Secondary | ICD-10-CM | POA: Diagnosis not present

## 2016-01-30 DIAGNOSIS — M25561 Pain in right knee: Secondary | ICD-10-CM | POA: Diagnosis not present

## 2016-02-06 DIAGNOSIS — M1711 Unilateral primary osteoarthritis, right knee: Secondary | ICD-10-CM | POA: Diagnosis not present

## 2016-02-16 DIAGNOSIS — D225 Melanocytic nevi of trunk: Secondary | ICD-10-CM | POA: Diagnosis not present

## 2016-02-16 DIAGNOSIS — L570 Actinic keratosis: Secondary | ICD-10-CM | POA: Diagnosis not present

## 2016-02-18 ENCOUNTER — Encounter
Admission: RE | Admit: 2016-02-18 | Discharge: 2016-02-18 | Disposition: A | Discharge status: Home / Self Care | Payer: BLUE CROSS/BLUE SHIELD | Source: Ambulatory Visit | Attending: Orthopedic Surgery | Admitting: Orthopedic Surgery

## 2016-02-18 DIAGNOSIS — Z01812 Encounter for preprocedural laboratory examination: Secondary | ICD-10-CM | POA: Insufficient documentation

## 2016-02-18 HISTORY — DX: Benign prostatic hyperplasia without lower urinary tract symptoms: N40.0

## 2016-02-18 HISTORY — DX: Unspecified osteoarthritis, unspecified site: M19.90

## 2016-02-18 HISTORY — DX: Other complications of anesthesia, initial encounter: T88.59XA

## 2016-02-18 HISTORY — DX: Tremor, unspecified: R25.1

## 2016-02-18 HISTORY — DX: Adverse effect of unspecified anesthetic, initial encounter: T41.45XA

## 2016-02-18 LAB — CBC
HEMATOCRIT: 42.1 % (ref 40.0–52.0)
HEMOGLOBIN: 14.3 g/dL (ref 13.0–18.0)
MCH: 30.5 pg (ref 26.0–34.0)
MCHC: 34.1 g/dL (ref 32.0–36.0)
MCV: 89.5 fL (ref 80.0–100.0)
Platelets: 167 10*3/uL (ref 150–440)
RBC: 4.71 MIL/uL (ref 4.40–5.90)
RDW: 13.7 % (ref 11.5–14.5)
WBC: 6.9 10*3/uL (ref 3.8–10.6)

## 2016-02-18 LAB — URINALYSIS COMPLETE WITH MICROSCOPIC (ARMC ONLY)
BILIRUBIN URINE: NEGATIVE
Bacteria, UA: NONE SEEN
Glucose, UA: NEGATIVE mg/dL
HGB URINE DIPSTICK: NEGATIVE
KETONES UR: NEGATIVE mg/dL
LEUKOCYTES UA: NEGATIVE
Nitrite: NEGATIVE
PH: 5 (ref 5.0–8.0)
PROTEIN: NEGATIVE mg/dL
SQUAMOUS EPITHELIAL / LPF: NONE SEEN
Specific Gravity, Urine: 1.018 (ref 1.005–1.030)

## 2016-02-18 LAB — COMPREHENSIVE METABOLIC PANEL
ALK PHOS: 42 U/L (ref 38–126)
ALT: 20 U/L (ref 17–63)
ANION GAP: 6 (ref 5–15)
AST: 22 U/L (ref 15–41)
Albumin: 4.4 g/dL (ref 3.5–5.0)
BILIRUBIN TOTAL: 1.1 mg/dL (ref 0.3–1.2)
BUN: 14 mg/dL (ref 6–20)
CALCIUM: 9 mg/dL (ref 8.9–10.3)
CO2: 28 mmol/L (ref 22–32)
CREATININE: 0.8 mg/dL (ref 0.61–1.24)
Chloride: 105 mmol/L (ref 101–111)
Glucose, Bld: 110 mg/dL — ABNORMAL HIGH (ref 65–99)
Potassium: 3.7 mmol/L (ref 3.5–5.1)
SODIUM: 139 mmol/L (ref 135–145)
TOTAL PROTEIN: 6.8 g/dL (ref 6.5–8.1)

## 2016-02-18 LAB — SURGICAL PCR SCREEN
MRSA, PCR: NEGATIVE
Staphylococcus aureus: NEGATIVE

## 2016-02-18 LAB — PROTIME-INR
INR: 1.03
Prothrombin Time: 13.7 seconds (ref 11.4–15.0)

## 2016-02-18 LAB — SEDIMENTATION RATE: Sed Rate: 3 mm/hr (ref 0–20)

## 2016-02-18 LAB — TYPE AND SCREEN
ABO/RH(D): A POS
Antibody Screen: NEGATIVE

## 2016-02-18 LAB — APTT: aPTT: 27 seconds (ref 24–36)

## 2016-02-18 NOTE — Patient Instructions (Signed)
  Your procedure is scheduled on: 02/25/16 Wed Report to Same Day Surgery 2nd floor medical mall To find out your arrival time please call [phone] between 1PM - 3PM on 02/24/16 Tues  Remember: Instructions that are not followed completely may result in serious medical risk, up to and including death, or upon the discretion of your surgeon and anesthesiologist your surgery may need to be rescheduled.    _x___ 1. Do not eat food or drink liquids after midnight. No gum chewing or hard candies.     __x__ 2. No Alcohol for 24 hours before or after surgery.   __x__3. No Smoking for 24 prior to surgery.   ____  4. Bring all medications with you on the day of surgery if instructed.    __x__ 5. Notify your doctor if there is any change in your medical condition     (cold, fever, infections).     Do not wear jewelry, make-up, hairpins, clips or nail polish.  Do not wear lotions, powders, or perfumes. You may wear deodorant.  Do not shave 48 hours prior to surgery. Men may shave face and neck.  Do not bring valuables to the hospital.    [facility] is not responsible for any belongings or valuables.               Contacts, dentures or bridgework may not be worn into surgery.  Leave your suitcase in the car. After surgery it may be brought to your room.  For patients admitted to the hospital, discharge time is determined by your treatment team.   Patients discharged the day of surgery will not be allowed to drive home.    Please read over the following fact sheets that you were given:   [facility] Preparing for Surgery and or MRSA Information   _x___ Take these medicines the morning of surgery with A SIP OF WATER:    1. bisoprolol (ZEBETA) 10 MG tablet  2.  3.  4.  5.  6.  ____ Fleet Enema (as directed)   _x___ Use CHG Soap or sage wipes as directed on instruction sheet   ____ Use inhalers on the day of surgery and bring to hospital day of surgery  ____ Stop metformin 2 days  prior to surgery    ____ Take 1/2 of usual insulin dose the night before surgery and none on the morning of           surgery.   ____ Stop aspirin or coumadin, or plavix  _x__ Stop Anti-inflammatories such as Advil, Aleve, Ibuprofen, Motrin, Naproxen,          Naprosyn, Goodies powders or aspirin products. Ok to take Tylenol. Stop Meloxicam 1 week before surgery ____ Stop supplements until after surgery.    ____ Bring C-Pap to the hospital.

## 2016-02-19 LAB — URINE CULTURE: SPECIAL REQUESTS: NORMAL

## 2016-02-25 ENCOUNTER — Inpatient Hospital Stay: Payer: BLUE CROSS/BLUE SHIELD

## 2016-02-25 ENCOUNTER — Inpatient Hospital Stay: Payer: BLUE CROSS/BLUE SHIELD | Admitting: Certified Registered Nurse Anesthetist

## 2016-02-25 ENCOUNTER — Encounter
Admission: RE | Disposition: A | Discharge status: Home Health | Payer: Self-pay | Source: Ambulatory Visit | Attending: Orthopedic Surgery

## 2016-02-25 ENCOUNTER — Inpatient Hospital Stay
Admission: RE | Admit: 2016-02-25 | Discharge: 2016-02-27 | DRG: 470 | Disposition: A | Discharge status: Home Health | Payer: BLUE CROSS/BLUE SHIELD | Source: Ambulatory Visit | Attending: Orthopedic Surgery | Admitting: Orthopedic Surgery

## 2016-02-25 ENCOUNTER — Encounter: Payer: Self-pay | Admitting: Orthopedic Surgery

## 2016-02-25 DIAGNOSIS — Z471 Aftercare following joint replacement surgery: Secondary | ICD-10-CM | POA: Diagnosis not present

## 2016-02-25 DIAGNOSIS — M1711 Unilateral primary osteoarthritis, right knee: Secondary | ICD-10-CM | POA: Diagnosis not present

## 2016-02-25 DIAGNOSIS — Z79899 Other long term (current) drug therapy: Secondary | ICD-10-CM

## 2016-02-25 DIAGNOSIS — Z91030 Bee allergy status: Secondary | ICD-10-CM

## 2016-02-25 DIAGNOSIS — Z96659 Presence of unspecified artificial knee joint: Secondary | ICD-10-CM | POA: Diagnosis not present

## 2016-02-25 DIAGNOSIS — Z96651 Presence of right artificial knee joint: Secondary | ICD-10-CM | POA: Diagnosis not present

## 2016-02-25 DIAGNOSIS — K219 Gastro-esophageal reflux disease without esophagitis: Secondary | ICD-10-CM | POA: Diagnosis not present

## 2016-02-25 HISTORY — PX: KNEE ARTHROPLASTY: SHX992

## 2016-02-25 SURGERY — ARTHROPLASTY, KNEE, TOTAL, USING IMAGELESS COMPUTER-ASSISTED NAVIGATION
Anesthesia: Spinal | Site: Knee | Laterality: Right | Wound class: Clean

## 2016-02-25 MED ORDER — ONDANSETRON HCL 4 MG PO TABS: 4.0000 mg | ORAL_TABLET | Freq: Four times a day (QID) | ORAL | Status: DC | PRN

## 2016-02-25 MED ORDER — ACETAMINOPHEN 10 MG/ML IV SOLN
1000.0000 mg | Freq: Four times a day (QID) | INTRAVENOUS | Status: DC
  Filled 2016-02-25 (×4): qty 100

## 2016-02-25 MED ORDER — METOCLOPRAMIDE HCL 10 MG PO TABS
10.0000 mg | ORAL_TABLET | Freq: Three times a day (TID) | ORAL | Status: AC
  Administered 2016-02-25 – 2016-02-27 (×6): 10 mg via ORAL
  Filled 2016-02-25 (×7): qty 1

## 2016-02-25 MED ORDER — BUPIVACAINE HCL (PF) 0.25 % IJ SOLN
INTRAMUSCULAR | Status: DC | PRN
  Administered 2016-02-25: 30 mL

## 2016-02-25 MED ORDER — BISOPROLOL FUMARATE 5 MG PO TABS
10.0000 mg | ORAL_TABLET | Freq: Every day | ORAL | Status: DC
  Administered 2016-02-26: 10 mg via ORAL
  Administered 2016-02-27: 5 mg via ORAL
  Filled 2016-02-25: qty 2
  Filled 2016-02-25 (×2): qty 1
  Filled 2016-02-25: qty 2

## 2016-02-25 MED ORDER — FLEET ENEMA 7-19 GM/118ML RE ENEM: 1.0000 | ENEMA | Freq: Once | RECTAL | Status: DC | PRN

## 2016-02-25 MED ORDER — SODIUM CHLORIDE FLUSH 0.9 % IV SOLN
INTRAVENOUS | Status: AC
  Filled 2016-02-25: qty 3

## 2016-02-25 MED ORDER — FENTANYL CITRATE (PF) 100 MCG/2ML IJ SOLN
INTRAMUSCULAR | Status: DC | PRN
  Administered 2016-02-25 (×2): 50 ug via INTRAVENOUS

## 2016-02-25 MED ORDER — TRANEXAMIC ACID 1000 MG/10ML IV SOLN
1000.0000 mg | INTRAVENOUS | Status: DC
  Filled 2016-02-25: qty 10

## 2016-02-25 MED ORDER — SODIUM CHLORIDE 0.9 % IJ SOLN
INTRAMUSCULAR | Status: AC
  Filled 2016-02-25: qty 50

## 2016-02-25 MED ORDER — ALUM & MAG HYDROXIDE-SIMETH 200-200-20 MG/5ML PO SUSP: 30.0000 mL | ORAL | Status: DC | PRN

## 2016-02-25 MED ORDER — EPHEDRINE SULFATE 50 MG/ML IJ SOLN
INTRAMUSCULAR | Status: DC | PRN
  Administered 2016-02-25: 10 mg via INTRAVENOUS
  Administered 2016-02-25: 5 mg via INTRAVENOUS
  Administered 2016-02-25: 10 mg via INTRAVENOUS
  Administered 2016-02-25: 5 mg via INTRAVENOUS

## 2016-02-25 MED ORDER — SENNOSIDES-DOCUSATE SODIUM 8.6-50 MG PO TABS
1.0000 | ORAL_TABLET | Freq: Two times a day (BID) | ORAL | Status: DC
  Administered 2016-02-25 – 2016-02-27 (×4): 1 via ORAL
  Filled 2016-02-25 (×4): qty 1

## 2016-02-25 MED ORDER — TETRACAINE HCL 1 % IJ SOLN
INTRAMUSCULAR | Status: DC | PRN
  Administered 2016-02-25: 10 mg via INTRASPINAL

## 2016-02-25 MED ORDER — LACTATED RINGERS IV SOLN
INTRAVENOUS | Status: DC | PRN
  Administered 2016-02-25: 11:00:00 via INTRAVENOUS

## 2016-02-25 MED ORDER — CEFAZOLIN SODIUM-DEXTROSE 2-4 GM/100ML-% IV SOLN
2.0000 g | INTRAVENOUS | Status: AC
  Administered 2016-02-25: 2 g via INTRAVENOUS

## 2016-02-25 MED ORDER — SODIUM CHLORIDE 0.9 % IV SOLN
INTRAVENOUS | Status: DC | PRN
  Administered 2016-02-25: 60 mL

## 2016-02-25 MED ORDER — ACETAMINOPHEN 10 MG/ML IV SOLN
INTRAVENOUS | Status: DC | PRN
  Administered 2016-02-25: 1000 mg via INTRAVENOUS

## 2016-02-25 MED ORDER — DIPHENHYDRAMINE HCL 12.5 MG/5ML PO ELIX: 12.5000 mg | ORAL_SOLUTION | ORAL | Status: DC | PRN

## 2016-02-25 MED ORDER — LACTATED RINGERS IV SOLN
INTRAVENOUS | Status: DC
  Administered 2016-02-25 (×2): via INTRAVENOUS

## 2016-02-25 MED ORDER — SODIUM CHLORIDE 0.9 % IV SOLN
Freq: Once | INTRAVENOUS | Status: AC
  Administered 2016-02-25: 19:00:00 via INTRAVENOUS

## 2016-02-25 MED ORDER — FAMOTIDINE 20 MG PO TABS
ORAL_TABLET | ORAL | Status: AC
  Administered 2016-02-25: 20 mg via ORAL
  Filled 2016-02-25: qty 1

## 2016-02-25 MED ORDER — MORPHINE SULFATE (PF) 2 MG/ML IV SOLN
2.0000 mg | INTRAVENOUS | Status: DC | PRN
  Administered 2016-02-25 – 2016-02-26 (×2): 2 mg via INTRAVENOUS
  Filled 2016-02-25 (×2): qty 1

## 2016-02-25 MED ORDER — ACETAMINOPHEN 10 MG/ML IV SOLN
1000.0000 mg | Freq: Four times a day (QID) | INTRAVENOUS | Status: AC
  Administered 2016-02-25 – 2016-02-26 (×4): 1000 mg via INTRAVENOUS
  Filled 2016-02-25 (×5): qty 100

## 2016-02-25 MED ORDER — NEOMYCIN-POLYMYXIN B GU 40-200000 IR SOLN
Status: DC | PRN
  Administered 2016-02-25: 14 mL

## 2016-02-25 MED ORDER — SODIUM CHLORIDE 0.9 % IV SOLN
INTRAVENOUS | Status: DC
  Administered 2016-02-25 – 2016-02-26 (×2): via INTRAVENOUS

## 2016-02-25 MED ORDER — TRANEXAMIC ACID 1000 MG/10ML IV SOLN
1000.0000 mg | Freq: Once | INTRAVENOUS | Status: AC
  Administered 2016-02-25: 1000 mg via INTRAVENOUS
  Filled 2016-02-25: qty 10

## 2016-02-25 MED ORDER — FERROUS SULFATE 325 (65 FE) MG PO TABS
325.0000 mg | ORAL_TABLET | Freq: Two times a day (BID) | ORAL | Status: DC
  Administered 2016-02-25 – 2016-02-26 (×3): 325 mg via ORAL
  Filled 2016-02-25 (×4): qty 1

## 2016-02-25 MED ORDER — ONDANSETRON HCL 4 MG/2ML IJ SOLN: 4.0000 mg | Freq: Four times a day (QID) | INTRAMUSCULAR | Status: DC | PRN

## 2016-02-25 MED ORDER — PROPOFOL 10 MG/ML IV BOLUS
INTRAVENOUS | Status: DC | PRN
  Administered 2016-02-25: 40 mg via INTRAVENOUS
  Administered 2016-02-25: 20 mg via INTRAVENOUS
  Administered 2016-02-25: 10 mg via INTRAVENOUS

## 2016-02-25 MED ORDER — MIDAZOLAM HCL 5 MG/5ML IJ SOLN
INTRAMUSCULAR | Status: DC | PRN
  Administered 2016-02-25: 2 mg via INTRAVENOUS

## 2016-02-25 MED ORDER — ACETAMINOPHEN 325 MG PO TABS
650.0000 mg | ORAL_TABLET | Freq: Four times a day (QID) | ORAL | Status: DC | PRN
  Administered 2016-02-27 (×2): 650 mg via ORAL
  Filled 2016-02-25 (×2): qty 2

## 2016-02-25 MED ORDER — FENTANYL CITRATE (PF) 100 MCG/2ML IJ SOLN: 25.0000 ug | INTRAMUSCULAR | Status: DC | PRN

## 2016-02-25 MED ORDER — OXYCODONE HCL 5 MG PO TABS
5.0000 mg | ORAL_TABLET | ORAL | Status: DC | PRN
  Administered 2016-02-25 (×2): 5 mg via ORAL
  Administered 2016-02-26 – 2016-02-27 (×7): 10 mg via ORAL
  Filled 2016-02-25: qty 1
  Filled 2016-02-25: qty 2
  Filled 2016-02-25: qty 1
  Filled 2016-02-25 (×6): qty 2

## 2016-02-25 MED ORDER — NEOMYCIN-POLYMYXIN B GU 40-200000 IR SOLN
Status: AC
  Filled 2016-02-25: qty 20

## 2016-02-25 MED ORDER — FAMOTIDINE 20 MG PO TABS
20.0000 mg | ORAL_TABLET | Freq: Once | ORAL | Status: AC
  Administered 2016-02-25: 20 mg via ORAL

## 2016-02-25 MED ORDER — PANTOPRAZOLE SODIUM 40 MG PO TBEC
40.0000 mg | DELAYED_RELEASE_TABLET | Freq: Two times a day (BID) | ORAL | Status: DC
  Administered 2016-02-25 – 2016-02-27 (×4): 40 mg via ORAL
  Filled 2016-02-25 (×4): qty 1

## 2016-02-25 MED ORDER — ENOXAPARIN SODIUM 30 MG/0.3ML ~~LOC~~ SOLN
30.0000 mg | Freq: Two times a day (BID) | SUBCUTANEOUS | Status: DC
  Administered 2016-02-26 – 2016-02-27 (×3): 30 mg via SUBCUTANEOUS
  Filled 2016-02-25 (×3): qty 0.3

## 2016-02-25 MED ORDER — CEFAZOLIN SODIUM-DEXTROSE 2-4 GM/100ML-% IV SOLN
2.0000 g | Freq: Four times a day (QID) | INTRAVENOUS | Status: AC
  Administered 2016-02-25 – 2016-02-26 (×4): 2 g via INTRAVENOUS
  Filled 2016-02-25 (×5): qty 100

## 2016-02-25 MED ORDER — PROPOFOL 500 MG/50ML IV EMUL
INTRAVENOUS | Status: DC | PRN
  Administered 2016-02-25: 75 ug/kg/min via INTRAVENOUS

## 2016-02-25 MED ORDER — PHENOL 1.4 % MT LIQD: 1.0000 | OROMUCOSAL | Status: DC | PRN

## 2016-02-25 MED ORDER — ACETAMINOPHEN 650 MG RE SUPP: 650.0000 mg | Freq: Four times a day (QID) | RECTAL | Status: DC | PRN

## 2016-02-25 MED ORDER — CELECOXIB 200 MG PO CAPS
200.0000 mg | ORAL_CAPSULE | Freq: Two times a day (BID) | ORAL | Status: DC
Start: 2016-02-25 — End: 2016-02-27
  Administered 2016-02-25 – 2016-02-27 (×4): 200 mg via ORAL
  Filled 2016-02-25 (×4): qty 1

## 2016-02-25 MED ORDER — TRAMADOL HCL 50 MG PO TABS
50.0000 mg | ORAL_TABLET | ORAL | Status: DC | PRN
  Administered 2016-02-25 (×2): 50 mg via ORAL
  Administered 2016-02-26 – 2016-02-27 (×6): 100 mg via ORAL
  Filled 2016-02-25 (×3): qty 2
  Filled 2016-02-25: qty 1
  Filled 2016-02-25 (×3): qty 2
  Filled 2016-02-25: qty 1

## 2016-02-25 MED ORDER — ACETAMINOPHEN 10 MG/ML IV SOLN
INTRAVENOUS | Status: AC
  Filled 2016-02-25: qty 100

## 2016-02-25 MED ORDER — MAGNESIUM HYDROXIDE 400 MG/5ML PO SUSP
30.0000 mL | Freq: Every day | ORAL | Status: DC | PRN
  Administered 2016-02-25 – 2016-02-26 (×2): 30 mL via ORAL
  Filled 2016-02-25 (×2): qty 30

## 2016-02-25 MED ORDER — CEFAZOLIN SODIUM-DEXTROSE 2-4 GM/100ML-% IV SOLN
INTRAVENOUS | Status: AC
  Filled 2016-02-25: qty 100

## 2016-02-25 MED ORDER — ONDANSETRON HCL 4 MG/2ML IJ SOLN: 4.0000 mg | Freq: Once | INTRAMUSCULAR | Status: DC | PRN

## 2016-02-25 MED ORDER — PRIMIDONE 50 MG PO TABS
50.0000 mg | ORAL_TABLET | Freq: Every day | ORAL | Status: DC
  Administered 2016-02-25 – 2016-02-26 (×2): 50 mg via ORAL
  Filled 2016-02-25 (×2): qty 1

## 2016-02-25 MED ORDER — BUPIVACAINE IN DEXTROSE 0.75-8.25 % IT SOLN
INTRATHECAL | Status: DC | PRN
  Administered 2016-02-25: 2 mL via INTRATHECAL

## 2016-02-25 MED ORDER — ONDANSETRON HCL 4 MG/2ML IJ SOLN
INTRAMUSCULAR | Status: DC | PRN
  Administered 2016-02-25: 4 mg via INTRAVENOUS

## 2016-02-25 MED ORDER — TAMSULOSIN HCL 0.4 MG PO CAPS
0.4000 mg | ORAL_CAPSULE | Freq: Every day | ORAL | Status: DC
  Administered 2016-02-25 – 2016-02-26 (×2): 0.4 mg via ORAL
  Filled 2016-02-25 (×2): qty 1

## 2016-02-25 MED ORDER — TRANEXAMIC ACID 1000 MG/10ML IV SOLN
1000.0000 mg | INTRAVENOUS | Status: DC | PRN
  Administered 2016-02-25: 1000 mg via INTRAVENOUS

## 2016-02-25 MED ORDER — BISACODYL 10 MG RE SUPP: 10.0000 mg | Freq: Every day | RECTAL | Status: DC | PRN

## 2016-02-25 MED ORDER — MENTHOL 3 MG MT LOZG: 1.0000 | LOZENGE | OROMUCOSAL | Status: DC | PRN

## 2016-02-25 MED ORDER — TETRACAINE HCL 1 % IJ SOLN
INTRAMUSCULAR | Status: AC
  Filled 2016-02-25: qty 2

## 2016-02-25 MED ORDER — BUPIVACAINE HCL (PF) 0.25 % IJ SOLN
INTRAMUSCULAR | Status: AC
  Filled 2016-02-25: qty 60

## 2016-02-25 MED ORDER — CHLORHEXIDINE GLUCONATE 4 % EX LIQD
60.0000 mL | Freq: Once | CUTANEOUS | Status: DC
Start: 2016-02-25 — End: 2016-02-25

## 2016-02-25 MED ORDER — BUPIVACAINE LIPOSOME 1.3 % IJ SUSP
INTRAMUSCULAR | Status: AC
  Filled 2016-02-25: qty 20

## 2016-02-25 SURGICAL SUPPLY — 60 items
AUTOTRANSFUS HAS 1/8 (MISCELLANEOUS) ×3
BATTERY INSTRU NAVIGATION (MISCELLANEOUS) ×12 IMPLANT
BLADE CLIPPER SURG (BLADE) ×3 IMPLANT
BLADE SAW 1 (BLADE) ×3 IMPLANT
BLADE SAW 1/2 (BLADE) ×3 IMPLANT
CANISTER SUCT 1200ML W/VALVE (MISCELLANEOUS) ×3 IMPLANT
CANISTER SUCT 3000ML (MISCELLANEOUS) ×6 IMPLANT
CAPT KNEE TOTAL 3 ATTUNE ×3 IMPLANT
CATH TRAY METER 16FR LF (MISCELLANEOUS) ×3 IMPLANT
CEMENT HV SMART SET (Cement) ×6 IMPLANT
COOLER POLAR GLACIER W/PUMP (MISCELLANEOUS) ×3 IMPLANT
CUFF TOURN 24 STER (MISCELLANEOUS) ×3 IMPLANT
CUFF TOURN 30 STER DUAL PORT (MISCELLANEOUS) IMPLANT
DRAPE SHEET LG 3/4 BI-LAMINATE (DRAPES) ×3 IMPLANT
DRSG DERMACEA 8X12 NADH (GAUZE/BANDAGES/DRESSINGS) ×3 IMPLANT
DRSG OPSITE POSTOP 4X14 (GAUZE/BANDAGES/DRESSINGS) ×3 IMPLANT
DRSG TEGADERM 4X4.75 (GAUZE/BANDAGES/DRESSINGS) ×3 IMPLANT
DURAPREP 26ML APPLICATOR (WOUND CARE) ×6 IMPLANT
ELECT CAUTERY BLADE 6.4 (BLADE) ×3 IMPLANT
ELECT REM PT RETURN 9FT ADLT (ELECTROSURGICAL) ×3
ELECTRODE REM PT RTRN 9FT ADLT (ELECTROSURGICAL) ×1 IMPLANT
EX-PIN ORTHOLOCK NAV 4X150 (PIN) ×6 IMPLANT
GLOVE BIOGEL M STRL SZ7.5 (GLOVE) ×6 IMPLANT
GLOVE INDICATOR 8.0 STRL GRN (GLOVE) ×6 IMPLANT
GLOVE SURG 9.0 ORTHO LTXF (GLOVE) ×3 IMPLANT
GLOVE SURG ORTHO 9.0 STRL STRW (GLOVE) ×3 IMPLANT
GOWN STRL REUS W/ TWL LRG LVL3 (GOWN DISPOSABLE) ×2 IMPLANT
GOWN STRL REUS W/TWL 2XL LVL3 (GOWN DISPOSABLE) ×6 IMPLANT
GOWN STRL REUS W/TWL LRG LVL3 (GOWN DISPOSABLE) ×4
HANDPIECE INTERPULSE COAX TIP (DISPOSABLE) ×2
HOLDER FOLEY CATH W/STRAP (MISCELLANEOUS) ×3 IMPLANT
HOOD PEEL AWAY FLYTE STAYCOOL (MISCELLANEOUS) ×6 IMPLANT
KIT RM TURNOVER STRD PROC AR (KITS) ×3 IMPLANT
KNIFE SCULPS 14X20 (INSTRUMENTS) ×3 IMPLANT
LABEL OR SOLS (LABEL) ×3 IMPLANT
NDL SAFETY 18GX1.5 (NEEDLE) ×3 IMPLANT
NEEDLE SPNL 20GX3.5 QUINCKE YW (NEEDLE) ×3 IMPLANT
NS IRRIG 500ML POUR BTL (IV SOLUTION) ×3 IMPLANT
PACK TOTAL KNEE (MISCELLANEOUS) ×3 IMPLANT
PAD WRAPON POLAR KNEE (MISCELLANEOUS) ×1 IMPLANT
PIN DRILL QUICK PACK ×3 IMPLANT
PIN FIXATION 1/8DIA X 3INL (PIN) ×3 IMPLANT
SET HNDPC FAN SPRY TIP SCT (DISPOSABLE) ×1 IMPLANT
SOL .9 NS 3000ML IRR  AL (IV SOLUTION) ×2
SOL .9 NS 3000ML IRR UROMATIC (IV SOLUTION) ×1 IMPLANT
SOL PREP PVP 2OZ (MISCELLANEOUS) ×3
SOLUTION PREP PVP 2OZ (MISCELLANEOUS) ×1 IMPLANT
SPONGE DRAIN TRACH 4X4 STRL 2S (GAUZE/BANDAGES/DRESSINGS) ×3 IMPLANT
STAPLER SKIN PROX 35W (STAPLE) ×3 IMPLANT
SUCTION FRAZIER HANDLE 10FR (MISCELLANEOUS) ×2
SUCTION TUBE FRAZIER 10FR DISP (MISCELLANEOUS) ×1 IMPLANT
SUT VIC AB 0 CT1 36 (SUTURE) ×3 IMPLANT
SUT VIC AB 1 CT1 36 (SUTURE) ×6 IMPLANT
SUT VIC AB 2-0 CT2 27 (SUTURE) ×3 IMPLANT
SYR 20CC LL (SYRINGE) ×3 IMPLANT
SYR 30ML LL (SYRINGE) ×6 IMPLANT
SYSTEM AUTOTRANSFUS DUAL TROCR (MISCELLANEOUS) ×1 IMPLANT
TOWEL OR 17X26 4PK STRL BLUE (TOWEL DISPOSABLE) ×3 IMPLANT
TOWER CARTRIDGE SMART MIX (DISPOSABLE) ×3 IMPLANT
WRAPON POLAR PAD KNEE (MISCELLANEOUS) ×3

## 2016-02-25 NOTE — NC FL2 (Signed)
[place] LEVEL OF CARE SCREENING TOOL     IDENTIFICATION  Patient Name: Rodney Rojas Birthdate: [birth year] Sex: male Admission Date (Current Location): 02/25/2016  [place] and [place] Number:  Engineering geologist and Address:  [facility], [address], [place], [place] [address]      Provider Number: [id number]  Attending Physician Name and Address:  Dereck Leep, MD  Relative Name and Phone Number:       Current Level of Care: Hospital Recommended Level of Care: [place] Prior Approval Number:    Date Approved/Denied:   PASRR Number:  ([id number])  Discharge Plan: SNF    Current Diagnoses: Patient Active Problem List   Diagnosis Date Noted  . S/P total knee arthroplasty 02/25/2016  . Chronic pain of left knee 02/24/2015  . GERD (gastroesophageal reflux disease) 02/24/2015  . Urinary hesitancy 02/24/2015  . Fam hx-ischem heart disease 09/01/2009  . Family history of prostate cancer 09/01/2009  . Umbilical hernia without obstruction and without gangrene 09/01/2009  . Benign essential tremor 01/14/2009  . Allergy to insects and arachnids 08/09/1998    Orientation RESPIRATION BLADDER Height & Weight     Self, Time, Situation, Place  Normal Continent Weight: 215 lb 2.7 oz (97.6 kg) Height:  5\' 10"  (177.8 cm)  BEHAVIORAL SYMPTOMS/MOOD NEUROLOGICAL BOWEL NUTRITION STATUS   (none )  (none ) Continent Diet (Regular Diet )  AMBULATORY STATUS COMMUNICATION OF NEEDS Skin   Extensive Assist Verbally Surgical wounds (Incision: Right Knee )                       Personal Care Assistance Level of Assistance  Bathing, Feeding, Dressing Bathing Assistance: Limited assistance Feeding assistance: Independent Dressing Assistance: Limited assistance     Functional Limitations Info  Sight, Hearing, Speech Sight Info: Adequate Hearing Info: Adequate Speech Info: Adequate    SPECIAL CARE FACTORS  FREQUENCY  PT (By licensed PT), OT (By licensed OT)     PT Frequency:  (5) OT Frequency:  (5)            Contractures      Additional Factors Info  Code Status, Allergies Code Status Info:  (Full Code. ) Allergies Info:  (Bee Venom )           Current Medications (02/25/2016):  This is the current hospital active medication list Current Facility-Administered Medications  Medication Dose Route Frequency Provider Last Rate Last Dose  . 0.9 %  sodium chloride infusion   Intravenous Continuous Dereck Leep, MD 100 mL/hr at 02/25/16 1736    . acetaminophen (OFIRMEV) IV 1,000 mg  1,000 mg Intravenous Q6H Dereck Leep, MD      . acetaminophen (TYLENOL) tablet 650 mg  650 mg Oral Q6H PRN Dereck Leep, MD       Or  . acetaminophen (TYLENOL) suppository 650 mg  650 mg Rectal Q6H PRN Dereck Leep, MD      . alum & mag hydroxide-simeth (MAALOX/MYLANTA) 200-200-20 MG/5ML suspension 30 mL  30 mL Oral Q4H PRN Dereck Leep, MD      . bisacodyl (DULCOLAX) suppository 10 mg  10 mg Rectal Daily PRN Dereck Leep, MD      . Derrill Memo ON 02/26/2016] bisoprolol (ZEBETA) tablet 10 mg  10 mg Oral Daily Dereck Leep, MD      . ceFAZolin (ANCEF) 2-4 GM/100ML-% IVPB           .  ceFAZolin (ANCEF) IVPB 2g/100 mL premix  2 g Intravenous Q6H Dereck Leep, MD      . celecoxib (CELEBREX) capsule 200 mg  200 mg Oral Q12H Dereck Leep, MD      . diphenhydrAMINE (BENADRYL) 12.5 MG/5ML elixir 12.5-25 mg  12.5-25 mg Oral Q4H PRN Dereck Leep, MD      . Derrill Memo ON 02/26/2016] enoxaparin (LOVENOX) injection 30 mg  30 mg Subcutaneous Q12H Dereck Leep, MD      . ferrous sulfate tablet 325 mg  325 mg Oral BID WC Dereck Leep, MD      . magnesium hydroxide (MILK OF MAGNESIA) suspension 30 mL  30 mL Oral Daily PRN Dereck Leep, MD      . menthol-cetylpyridinium (CEPACOL) lozenge 3 mg  1 lozenge Oral PRN Dereck Leep, MD       Or  . phenol (CHLORASEPTIC) mouth spray 1 spray  1 spray Mouth/Throat PRN  Dereck Leep, MD      . metoCLOPramide (REGLAN) tablet 10 mg  10 mg Oral TID AC & HS Dereck Leep, MD   10 mg at 02/25/16 1650  . morphine 2 MG/ML injection 2 mg  2 mg Intravenous Q2H PRN Dereck Leep, MD      . ondansetron (ZOFRAN) tablet 4 mg  4 mg Oral Q6H PRN Dereck Leep, MD       Or  . ondansetron (ZOFRAN) injection 4 mg  4 mg Intravenous Q6H PRN Dereck Leep, MD      . oxyCODONE (Oxy IR/ROXICODONE) immediate release tablet 5-10 mg  5-10 mg Oral Q4H PRN Dereck Leep, MD   5 mg at 02/25/16 1733  . pantoprazole (PROTONIX) EC tablet 40 mg  40 mg Oral BID Dereck Leep, MD      . primidone (MYSOLINE) tablet 50 mg  50 mg Oral QHS Dereck Leep, MD      . senna-docusate (Senokot-S) tablet 1 tablet  1 tablet Oral BID Dereck Leep, MD      . sodium chloride flush 0.9 % injection           . sodium phosphate (FLEET) 7-19 GM/118ML enema 1 enema  1 enema Rectal Once PRN Dereck Leep, MD      . tamsulosin (FLOMAX) capsule 0.4 mg  0.4 mg Oral QPC supper Dereck Leep, MD      . traMADol Veatrice Bourbon) tablet 50-100 mg  50-100 mg Oral Q4H PRN Dereck Leep, MD         Discharge Medications: Please see discharge summary for a list of discharge medications.  Relevant Imaging Results:  Relevant Lab Results:   Additional Information  (SSN: [id number])  Dimond Crotty, Bronwen Betters, LCSW

## 2016-02-25 NOTE — Op Note (Signed)
OPERATIVE NOTE  DATE OF SURGERY:  02/25/2016  PATIENT NAME:  Rodney Rojas   DOB: [birth year]  MRN: [id number]  PRE-OPERATIVE DIAGNOSIS: Degenerative arthrosis of the right knee, primary  POST-OPERATIVE DIAGNOSIS:  Same  PROCEDURE:  Right total knee arthroplasty using computer-assisted navigation  SURGEON:  Marciano Sequin. M.D.  ASSISTANT:  Vance Peper, PA (present and scrubbed throughout the case, critical for assistance with exposure, retraction, instrumentation, and closure)  ANESTHESIA: spinal  ESTIMATED BLOOD LOSS: 50 mL  FLUIDS REPLACED: 1500 mL of crystalloid  TOURNIQUET TIME: 120 minutes  DRAINS: 2 medium drains to a reinfusion system  SOFT TISSUE RELEASES: Anterior cruciate ligament, posterior cruciate ligament, deep medial collateral ligament, patellofemoral ligament   IMPLANTS UTILIZED: DePuy Attune size 6 posterior stabilized femoral component (cemented), size 7 rotating platform tibial component (cemented), 38 mm medialized dome patella (cemented), and a 6 mm stabilized rotating platform polyethylene insert.  INDICATIONS FOR SURGERY: Rodney Rojas is a 55 y.o. year old male with a long history of progressive knee pain. X-rays demonstrated severe degenerative changes in tricompartmental fashion. The patient had not seen any significant improvement despite conservative nonsurgical intervention. After discussion of the risks and benefits of surgical intervention, the patient expressed understanding of the risks benefits and agree with plans for total knee arthroplasty.   The risks, benefits, and alternatives were discussed at length including but not limited to the risks of infection, bleeding, nerve injury, stiffness, blood clots, the need for revision surgery, cardiopulmonary complications, among others, and they were willing to proceed.  PROCEDURE IN DETAIL: The patient was brought into the operating room and, after adequate spinal anesthesia was achieved, a  tourniquet was placed on the patient's upper thigh. The patient's knee and leg were cleaned and prepped with alcohol and DuraPrep and draped in the usual sterile fashion. A "timeout" was performed as per usual protocol. The lower extremity was exsanguinated using an Esmarch, and the tourniquet was inflated to 300 mmHg. An anterior longitudinal incision was made followed by a standard mid vastus approach. The deep fibers of the medial collateral ligament were elevated in a subperiosteal fashion off of the medial flare of the tibia so as to maintain a continuous soft tissue sleeve. The patella was subluxed laterally and the patellofemoral ligament was incised. Inspection of the knee demonstrated severe degenerative changes with full-thickness loss of articular cartilage. Osteophytes were debrided using a rongeur. Anterior and posterior cruciate ligaments were excised. Two 4.0 mm Schanz pins were inserted in the femur and into the tibia for attachment of the array of trackers used for computer-assisted navigation. Hip center was identified using a circumduction technique. Distal landmarks were mapped using the computer. The distal femur and proximal tibia were mapped using the computer. The distal femoral cutting guide was positioned using computer-assisted navigation so as to achieve a 5 distal valgus cut. The femur was sized and it was felt that a size 6 femoral component was appropriate. A size 6 femoral cutting guide was positioned and the anterior cut was performed and verified using the computer. This was followed by completion of the posterior and chamfer cuts. Femoral cutting guide for the central box was then positioned in the center box cut was performed.  Attention was then directed to the proximal tibia. Medial and lateral menisci were excised. The extramedullary tibial cutting guide was positioned using computer-assisted navigation so as to achieve a 0 varus-valgus alignment and 3 posterior slope. The  cut was performed and verified using the  computer. The proximal tibia was sized and it was felt that a size 7 tibial tray was appropriate. Tibial and femoral trials were inserted followed by insertion of a 5 and then 6 mm polyethylene insert. This allowed for excellent mediolateral soft tissue balancing both in flexion and in full extension. Finally, the patella was cut and prepared so as to accommodate a 38 mm medialized dome patella. A patella trial was placed and the knee was placed through a range of motion with excellent patellar tracking appreciated. The femoral trial was removed after debridement of posterior osteophytes. The central post-hole for the tibial component was reamed followed by insertion of a keel punch. Tibial trials were then removed. Cut surfaces of bone were irrigated with copious amounts of normal saline with antibiotic solution using pulsatile lavage and then suctioned dry. Polymethylmethacrylate cement was prepared in the usual fashion using a vacuum mixer. Cement was applied to the cut surface of the proximal tibia as well as along the undersurface of a size 7 rotating platform tibial component. Tibial component was positioned and impacted into place. Excess cement was removed using [employer] fast streamer. Cement was then applied to the cut surfaces of the femur as well as along the posterior flanges of the size 6 femoral component. The femoral component was positioned and impacted into place. Excess cement was removed using [employer] fast streamer. A 6 mm polyethylene trial was inserted and the knee was brought into full extension with steady axial compression applied. Finally, cement was applied to the backside of a 38 mm medialized dome patella and the patellar component was positioned and patellar clamp applied. Excess cement was removed using [employer] fast streamer. After adequate curing of the cement, the tourniquet was deflated after a total tourniquet time of 120 minutes. Hemostasis was achieved using  electrocautery. The knee was irrigated with copious amounts of normal saline with antibiotic solution using pulsatile lavage and then suctioned dry. 20 mL of 1.3% Exparel and 60 mL of 0.25% bupivacaine in 40 mL of normal saline was injected along the posterior capsule, medial and lateral gutters, and along the arthrotomy site. A 6 mm stabilized rotating platform polyethylene insert was inserted and the knee was placed through a range of motion with excellent mediolateral soft tissue balancing appreciated and excellent patellar tracking noted. 2 medium drains were placed in the wound bed and brought out through separate stab incisions to be attached to a reinfusion system. The medial parapatellar portion of the incision was reapproximated using interrupted sutures of #1 Vicryl. Subcutaneous tissue was approximated in layers using first #0 Vicryl followed #2-0 Vicryl. The skin was approximated with skin staples. A sterile dressing was applied.  The patient tolerated the procedure well and was transported to the recovery room in stable condition.    James P. Holley Bouche., M.D.

## 2016-02-25 NOTE — Anesthesia Preprocedure Evaluation (Addendum)
Anesthesia Evaluation  Patient identified by MRN, date of birth, ID band Patient awake    Reviewed: Allergy & Precautions, NPO status , Patient's Chart, lab work & pertinent test results  Airway Mallampati: II       Dental  (+) Teeth Intact   Pulmonary neg pulmonary ROS,    breath sounds clear to auscultation       Cardiovascular Exercise Tolerance: Good  Rhythm:Regular     Neuro/Psych Benign tremors negative neurological ROS  negative psych ROS   GI/Hepatic Neg liver ROS, GERD  Medicated,  Endo/Other  negative endocrine ROS  Renal/GU negative Renal ROS  negative genitourinary   Musculoskeletal negative musculoskeletal ROS (+)   Abdominal Normal abdominal exam  (+)   Peds negative pediatric ROS (+)  Hematology negative hematology ROS (+)   Anesthesia Other Findings   Reproductive/Obstetrics                             Anesthesia Physical Anesthesia Plan  ASA: II  Anesthesia Plan: Spinal   Post-op Pain Management:    Induction: Intravenous  Airway Management Planned: Natural Airway and Nasal Cannula  Additional Equipment:   Intra-op Plan:   Post-operative Plan:   Informed Consent: I have reviewed the patients History and Physical, chart, labs and discussed the procedure including the risks, benefits and alternatives for the proposed anesthesia with the patient or authorized representative who has indicated his/her understanding and acceptance.     Plan Discussed with: CRNA  Anesthesia Plan Comments:         Anesthesia Quick Evaluation

## 2016-02-25 NOTE — Anesthesia Procedure Notes (Addendum)
Spinal  Start time: 02/25/2016 11:00 AM End time: 02/25/2016 11:10 AM Reason for block: at surgeon's request Staffing Anesthesiologist: Marline Backbone F Performed by: anesthesiologist  Preanesthetic Checklist Completed: patient identified, site marked, surgical consent, pre-op evaluation, timeout performed, IV checked, risks and benefits discussed, monitors and equipment checked and at surgeon's request Spinal Block Patient position: sitting Prep: Betadine Patient monitoring: heart rate and blood pressure Approach: right paramedian Location: L3-4 Injection technique: single-shot Needle Needle type: Tuohy  Needle gauge: 25 G Needle length: 9 cm Needle insertion depth: 7 cm Assessment Sensory level: T8  Date/Time: 02/25/2016 11:10 AM Performed by: Johnna Acosta Pre-anesthesia Checklist: Patient identified, Emergency Drugs available, Suction available, Patient being monitored and Timeout performed Patient Re-evaluated:Patient Re-evaluated prior to inductionOxygen Delivery Method: Simple face mask

## 2016-02-25 NOTE — Transfer of Care (Signed)
Immediate Anesthesia Transfer of Care Note  Patient: Rodney Rojas  Procedure(s) Performed: Procedure(s): COMPUTER ASSISTED TOTAL KNEE ARTHROPLASTY (Right)  Patient Location: PACU  Anesthesia Type:Spinal  Level of Consciousness: awake and sedated  Airway & Oxygen Therapy: Patient Spontanous Breathing and Patient connected to face mask oxygen  Post-op Assessment: Report given to RN and Post -op Vital signs reviewed and stable  Post vital signs: Reviewed and stable  Last Vitals:  Filed Vitals:   02/25/16 0954 02/25/16 1511  BP: 158/96 104/64  Pulse: 50 56  Temp: 36.8 C 36.1 C  Resp: 18 12    Last Pain:  Filed Vitals:   02/25/16 1511  PainSc: 4          Complications: No apparent anesthesia complications

## 2016-02-25 NOTE — Brief Op Note (Signed)
02/25/2016  3:16 PM  PATIENT:  Trilby Leaver  55 y.o. male  PRE-OPERATIVE DIAGNOSIS:  PRIMARY OSTEOARTHRITIS of the right knee  POST-OPERATIVE DIAGNOSIS:  Same  PROCEDURE:  Procedure(s): COMPUTER ASSISTED TOTAL KNEE ARTHROPLASTY (Right)  SURGEON:  Surgeon(s) and Role:    * Dereck Leep, MD - Primary  ASSISTANTS: Vance Peper, PA   ANESTHESIA:   spinal  EBL:  Total I/O In: 1500 [I.V.:1500] Out: 800 [Urine:750; Blood:50]  BLOOD ADMINISTERED:none  DRAINS: 2 medium drains to a reinfusion system   LOCAL MEDICATIONS USED:  MARCAINE    and OTHER Exparel  SPECIMEN:  No Specimen  DISPOSITION OF SPECIMEN:  N/A  COUNTS:  YES  TOURNIQUET:   120 minutes  DICTATION: .Dragon Dictation  PLAN OF CARE: Admit to inpatient   PATIENT DISPOSITION:  PACU - hemodynamically stable.   Delay start of Pharmacological VTE agent (>24hrs) due to surgical blood loss or risk of bleeding: yes

## 2016-02-25 NOTE — H&P (Signed)
The patient has been re-examined, and the chart reviewed, and there have been no interval changes to the documented history and physical.    The risks, benefits, and alternatives have been discussed at length. The patient expressed understanding of the risks benefits and agreed with plans for surgical intervention.  James P. Hooten, Jr. M.D.    

## 2016-02-26 LAB — CBC
HCT: 35.4 % — ABNORMAL LOW (ref 40.0–52.0)
Hemoglobin: 12.4 g/dL — ABNORMAL LOW (ref 13.0–18.0)
MCH: 31 pg (ref 26.0–34.0)
MCHC: 35.1 g/dL (ref 32.0–36.0)
MCV: 88.4 fL (ref 80.0–100.0)
PLATELETS: 156 10*3/uL (ref 150–440)
RBC: 4.01 MIL/uL — AB (ref 4.40–5.90)
RDW: 13.9 % (ref 11.5–14.5)
WBC: 9.9 10*3/uL (ref 3.8–10.6)

## 2016-02-26 LAB — BASIC METABOLIC PANEL
Anion gap: 6 (ref 5–15)
BUN: 10 mg/dL (ref 6–20)
CO2: 27 mmol/L (ref 22–32)
CREATININE: 0.8 mg/dL (ref 0.61–1.24)
Calcium: 7.6 mg/dL — ABNORMAL LOW (ref 8.9–10.3)
Chloride: 102 mmol/L (ref 101–111)
Glucose, Bld: 120 mg/dL — ABNORMAL HIGH (ref 65–99)
POTASSIUM: 4.2 mmol/L (ref 3.5–5.1)
SODIUM: 135 mmol/L (ref 135–145)

## 2016-02-26 NOTE — Care Management Note (Signed)
Case Management Note  Patient Details  Name: Rodney Rojas MRN: [id number] Date of Birth: 1961-06-07  Subjective/Objective:                  Met with patient to discuss discharge planning. Patient plans to return home with his wife. He would like to use [facility] for home health PT. He uses [facility] for medications  [phone]. He states he has a elevated toilet but will need a front-wheeled walker.  Action/Plan: List of home health agencies left with patient. Referral made to [facility]. [facility]m # [facility] RNCM will continue to follow.   Expected Discharge Date:                  Expected Discharge Plan:     In-House Referral:     Discharge planning Services     Post Acute Care Choice:  Durable Medical Equipment, Home Health Choice offered to:  Patient  DME Arranged:  Walker rolling [place]  [place] [facility]  Status of Service:  In process, will continue to follow  If discussed at Long Length of Stay Meetings, dates discussed:    Additional Comments:  AMarshell Garfinkel RN 02/26/2016, 11:20 AM

## 2016-02-26 NOTE — Anesthesia Postprocedure Evaluation (Signed)
Anesthesia Post Note  Patient: Rodney Rojas  Procedure(s) Performed: Procedure(s) (LRB): COMPUTER ASSISTED TOTAL KNEE ARTHROPLASTY (Right)  Patient location during evaluation: PACU Anesthesia Type: General Level of consciousness: awake Pain management: pain level controlled Vital Signs Assessment: post-procedure vital signs reviewed and stable Respiratory status: spontaneous breathing Cardiovascular status: blood pressure returned to baseline Anesthetic complications: no    Last Vitals:  Filed Vitals:   02/25/16 2359 02/26/16 0503  BP: 142/78 142/73  Pulse: 76 71  Temp: 36.9 C 36.6 C  Resp: 18 18    Last Pain:  Filed Vitals:   02/26/16 0634  PainSc: 6                  VAN STAVEREN,Kollin Udell

## 2016-02-26 NOTE — Plan of Care (Signed)
Problem: Safety: Goal: Ability to remain free from injury will improve Outcome: Completed/Met Date Met:  02/26/16 Patient is aware of fall/injury risk, calls for assistance.

## 2016-02-26 NOTE — Evaluation (Signed)
Occupational Therapy Evaluation Patient Details Name: Rodney Rojas MRN: [id number] DOB: [birth year] Today's Date: 02/26/2016    History of Present Illness Pt is a 55 yr old male s/p elective R TKA 7/19. PMH significant for arthritis, GERD, tremors of nervous system, and h/o umbilical hernia with repair.   Clinical Impression   Pt is 55 year old male s/p R TKR.  Pt was independent in all ADLs prior to surgery and working and driving and is eager to return to PLOF.  Pt currently requires moderate assist for LB dressing while in seated position at EOB due to pain and limited AROM of R knee.  Pt would benefit from instruction in dressing techniques with or without assistive devices for dressing and bathing skills.  Pt would also benefit from recommendations for home modifications to increase safety in the bathroom and prevent falls. Will assess for OT [facility] needs as pt progresses in therapy.      Follow Up Recommendations   (will assess for OT HH needs but most likely will not need any)    Equipment Recommendations       Recommendations for Other Services       Precautions / Restrictions Precautions Precautions: Fall Restrictions Weight Bearing Restrictions: Yes RLE Weight Bearing: Weight bearing as tolerated      Mobility Bed Mobility Overal bed mobility: Modified Independent             General bed mobility comments: With [facility] elevated ~30 degrees, pt able to assume sitting EOB without physical assist or cueing.  Transfers Overall transfer level: Needs assistance Equipment used: Rolling walker (2 wheeled) Transfers: Sit to/from Stand Sit to Stand: Min guard              Balance Overall balance assessment: Needs assistance Sitting-balance support: Feet supported Sitting balance-Leahy Scale: Good     Standing balance support: Bilateral upper extremity supported Standing balance-Leahy Scale: Fair                              ADL Overall ADL's :  Needs assistance/impaired                                       General ADL Comments: Pt requires mod assist and cues for LB dressing without AD and declined training with AD since PT was coming soon to get him up and walk and he wanted to conserve his energy and any more pain for that.  Discussed options for AD in case he needs them and rec trying LB dressing tomorow when up in chair to assess for needs and discuss and train his wife when she is present.        Vision     Perception     Praxis      Pertinent Vitals/Pain Pain Assessment: 0-10 Pain Score: 6  Pain Location: R knee Pain Descriptors / Indicators: Aching;Constant;Operative site guarding Pain Intervention(s): Limited activity within patient's tolerance;Monitored during session;Premedicated before session;Ice applied     Hand Dominance Right   Extremity/Trunk Assessment Upper Extremity Assessment Upper Extremity Assessment: Overall WFL for tasks assessed   Lower Extremity Assessment Lower Extremity Assessment: Defer to PT evaluation   Cervical / Trunk Assessment Cervical / Trunk Assessment: Normal   Communication Communication Communication: No difficulties   Cognition Arousal/Alertness: Awake/alert Behavior During Therapy: WFL for tasks assessed/performed  Overall Cognitive Status: Within Functional Limits for tasks assessed                     General Comments       Exercises Exercises: Total Joint     Shoulder Instructions      Home Living Family/patient expects to be discharged to:: Private residence Living Arrangements: Spouse/significant other Available Help at Discharge: Family;Available 24 hours/day Type of Home: House Home Access: Stairs to enter [employer] of Steps: 2 Entrance Stairs-Rails: None Home Layout: One level     Bathroom Shower/Tub: Tub/shower unit Shower/tub characteristics: Architectural technologist: Standard Bathroom Accessibility:  Yes How Accessible: Accessible via walker Home Equipment: Crutches          Prior Functioning/Environment Level of Independence: Independent        Comments: Employed, drives, fairly active    OT Diagnosis: Acute pain   OT Problem List: Decreased strength;Decreased range of motion;Decreased activity tolerance;Pain   OT Treatment/Interventions: Self-care/ADL training;Therapeutic activities;Patient/family education;DME and/or AE instruction    OT Goals(Current goals can be found in the care plan section) Acute Rehab OT Goals Patient Stated Goal: to reduce pain and be more active again OT Goal Formulation: With patient Time For Goal Achievement: 03/11/16 Potential to Achieve Goals: Good ADL Goals Pt Will Perform Lower Body Dressing: with adaptive equipment;sit to/from stand (with or without AD and no LOB) Pt Will Transfer to Toilet: with supervision;stand pivot transfer  OT Frequency: Min 1X/week   Barriers to D/C:            Co-evaluation              End of Session    Activity Tolerance: Patient limited by pain Patient left: in bed;with call bell/phone within reach;with bed alarm set   Time: [id number] OT Time Calculation (min): 24 min Charges:  OT General Charges $OT Visit: 1 Procedure OT Evaluation $OT Eval Moderate Complexity: 1 Procedure OT Treatments $Self Care/Home Management : 8-22 mins G-Codes:     Chrys Racer, OTR/L ascom [phone] 02/26/2016, 2:18 PM

## 2016-02-26 NOTE — Evaluation (Signed)
Physical Therapy Evaluation Patient Details Name: Rodney Rojas MRN: [id number] DOB: 02/08/1961 Today's Date: 02/26/2016   History of Present Illness  Pt is a 55 yr old male s/p elective R TKA 7/19. PMH significant for arthritis, GERD, tremors of nervous system, and h/o umbilical hernia with repair.    Clinical Impression  Prior to admission, pt was independent with all functional mobility and ADLs, though was with significant limp 2/2 R knee pain.  Pt lives with his wife in a one-story home with 2 STE.  Currently pt is mod I with bed mobility and CGA for sit <> stand transfers and ambulation x42ft with RW.  Pt demonstrates ability to independently perform RLE SLR x10 reps, thus no KI necessary. R knee ROM -9 to 65 degrees. Pt would benefit from skilled PT to address noted impairments and functional limitations.  Recommend pt discharge home with HHPT and RW when medically appropriate.     Follow Up Recommendations Home health PT    Equipment Recommendations  Rolling walker with 5" wheels    Recommendations for Other Services       Precautions / Restrictions Precautions Precautions: Fall Restrictions Weight Bearing Restrictions: Yes RLE Weight Bearing: Weight bearing as tolerated      Mobility  Bed Mobility Overal bed mobility: Modified Independent General bed mobility comments: With [facility] elevated ~30 degrees, pt able to assume sitting EOB without physical assist or cueing.  Transfers Overall transfer level: Needs assistance Equipment used: Rolling walker (2 wheeled) Transfers: Sit to/from Stand Sit to Stand: Min guard General transfer comment: Min guard for safety, no physical assist required. In standing, pt demonstrates ability to shift weight and march in place at [facility]. Pt feels relief in standing, and stays standing ~3 minutes before becoming fatigued and returning to sit.   Ambulation/Gait Ambulation/Gait assistance: Min guard Ambulation Distance (Feet): 10  Feet Assistive device: Rolling walker (2 wheeled) Gait Pattern/deviations: Antalgic;Decreased stance time - right Gait velocity: Decreased General Gait Details: With moderate UE support on RW, pt able to ambulate to the foot of bed and back to recliner. CGA for safety and vc's for technique/DME use.  Stairs Stairs:  (Will need to assess prior to d/c)    Balance Overall balance assessment: Needs assistance Sitting-balance support: Feet supported Sitting balance-Leahy Scale: Good Standing balance support: Bilateral upper extremity supported (on RW) Standing balance-Leahy Scale: Fair      Pertinent Vitals/Pain Pain Assessment: 0-10 Pain Score: 8  (with movement, 3/10 at rest) Pain Location: R knee Pain Intervention(s): Limited activity within patient's tolerance;Monitored during session;Repositioned;Patient requesting pain meds-RN notified;Ice applied   HR and O2 monitored throughout session and maintained WFL.      Home Living Family/patient expects to be discharged to:: Private residence Living Arrangements: Spouse/significant other Available Help at Discharge: Family;Available 24 hours/day Type of Home: House Home Access: Stairs to enter Entrance Stairs-Rails: None Entrance Stairs-Number of Steps: 2 Home Layout: One level Home Equipment: Crutches  Very supportive family; wife available 24/7 and daughter lives close by and works from home    Prior Function Level of Independence: Independent  Comments: Employed, drives, fairly active        Extremity/Trunk Assessment   Upper Extremity Assessment: Overall WFL for tasks assessed   Lower Extremity Assessment: Overall WFL for tasks assessed Light touch sensation in tact and strength at least 3/5 bilaterally.   Cervical / Trunk Assessment: Normal    Communication   Communication: No difficulties  Cognition Arousal/Alertness: Awake/alert Behavior During Therapy:  WFL for tasks assessed/performed Overall Cognitive  Status: Within Functional Limits for tasks assessed    General Comments General comments (skin integrity, edema, etc.): Ace bandaging with polar care RLE. Hemovac in tact and draining.    Exercises Total Joint Exercises Ankle Circles/Pumps: AROM Quad Sets: AROM Gluteal Sets: AROM Short Arc Quad: AROM Heel Slides: AROM (limited range) Hip ABduction/ADduction: AROM Straight Leg Raises: AROM All exercises performed RLE x 10 reps in (semi)supine.  Goniometric ROM: R knee extension in semi-supine lacking 9 degrees from neutral. R knee flexion in sitting 65 degrees.      Assessment/Plan    PT Assessment Patient needs continued PT services  PT Diagnosis Difficulty walking;Acute pain   PT Problem List Decreased range of motion;Decreased balance;Decreased mobility;Decreased knowledge of use of DME;Decreased safety awareness;Decreased activity tolerance  PT Treatment Interventions DME instruction;Gait training;Stair training;Functional mobility training;Balance training;Therapeutic exercise;Therapeutic activities;Patient/family education   PT Goals (Current goals can be found in the Care Plan section) Acute Rehab PT Goals Patient Stated Goal: To reduce R knee pain and subsequent limp PT Goal Formulation: With patient Time For Goal Achievement: 03/11/16 Potential to Achieve Goals: Good    Frequency BID   Barriers to discharge           End of Session Equipment Utilized During Treatment: Gait belt Activity Tolerance: Patient tolerated treatment well Patient left: in chair;with call bell/phone within reach;with chair alarm set;with family/visitor present;with SCD's reapplied (bone foam in place RLE, L heel elevated on towel roll, polar care on and running) Nurse Communication: Mobility status;Patient requests pain meds;Weight bearing status         Time: [id number] PT Time Calculation (min) (ACUTE ONLY): 32 min   Charges:         PT G Codes:        Ceola Para,  SPT 02/26/2016, 9:42 AM

## 2016-02-26 NOTE — Progress Notes (Signed)
   Subjective: 1 Day Post-Op Procedure(s) (LRB): COMPUTER ASSISTED TOTAL KNEE ARTHROPLASTY (Right) Patient reports pain as 5 on 0-10 scale.   Patient is well, and has had no acute complaints or problems We will start therapy today.  Plan is to go Home after hospital stay. no nausea and no vomiting Patient denies any chest pains or shortness of breath. Objective: Vital signs in last 24 hours: Temp:  [97 F (36.1 C)-98.6 F (37 C)] 97.8 F (36.6 C) (07/20 0503) Pulse Rate:  [46-76] 71 (07/20 0503) Resp:  [0-19] 18 (07/20 0503) BP: (94-188)/(64-96) 142/73 mmHg (07/20 0503) SpO2:  [97 %-100 %] 98 % (07/20 0503) FiO2 (%):  [21 %] 21 % (07/19 1616) Weight:  [97.6 kg (215 lb 2.7 oz)] 97.6 kg (215 lb 2.7 oz) (07/19 1628) Heels are non tender and bone foam in place  Intake/Output from previous day: 07/19 0701 - 07/20 0700 In: 4113.7 [I.V.:2911.7; Blood:226; IV [id number] Out: 2305 [Urine:1850; Drains:405; Blood:50] Intake/Output this shift:     Recent Labs  02/26/16 0458  HGB 12.4*    Recent Labs  02/26/16 0458  WBC 9.9  RBC 4.01*  HCT 35.4*  PLT 156    Recent Labs  02/26/16 0458  NA 135  K 4.2  CL 102  CO2 27  BUN 10  CREATININE 0.80  GLUCOSE 120*  CALCIUM 7.6*   No results for input(s): LABPT, INR in the last 72 hours.  EXAM General - Patient is Alert, Appropriate and Oriented Extremity - Neurologically intact Neurovascular intact Sensation intact distally Intact pulses distally Dorsiflexion/Plantar flexion intact Compartment soft Dressing - dressing C/D/I Motor Function - intact, moving foot and toes well on exam. Partial SLR with assistance   Past Medical History  Diagnosis Date  . History of chicken pox   . Arthritis   . Tremors of nervous system   . Prostate enlargement   . Complication of anesthesia     slow to wake up after colonoscopy    Assessment/Plan: 1 Day Post-Op Procedure(s) (LRB): COMPUTER ASSISTED TOTAL KNEE ARTHROPLASTY  (Right) Active Problems:   S/P total knee arthroplasty  Estimated body mass index is 30.87 kg/(m^2) as calculated from the following:   Height as of this encounter: 5\' 10"  (1.778 m).   Weight as of this encounter: 97.6 kg (215 lb 2.7 oz). Advance diet Up with therapy D/C IV fluids Plan for discharge tomorrow Discharge home with home health  Labs: reviewed DVT Prophylaxis - Lovenox, Foot Pumps and TED hose Weight-Bearing as tolerated to right leg D/C O2 and Pulse OX and try on Room Air Begin working on a bowel movement Labs tomorrow morning  Rodney Rojas R. [place] 02/26/2016, 7:14 AM

## 2016-02-26 NOTE — Progress Notes (Signed)
Physical Therapy Treatment Patient Details Name: Rodney Rojas MRN: [id number] DOB: 1960/10/07 Today's Date: 02/26/2016    History of Present Illness Pt is a 55 yr old male s/p elective R TKA 7/19. PMH significant for arthritis, GERD, tremors of nervous system, and h/o umbilical hernia with repair.    PT Comments    Pt in bed agrees to session.  Participated in exercises as described below.  Pt was able to transition in and out of bed without assist.  Stood with min guard and was able to ambulate 90' with walker and min guard.  He did report some dizziness with gait.  No los of balance or bucking noted.    Follow Up Recommendations  Home health PT     Equipment Recommendations  Rolling walker with 5" wheels    Recommendations for Other Services       Precautions / Restrictions Precautions Precautions: Fall Restrictions Weight Bearing Restrictions: Yes RLE Weight Bearing: Weight bearing as tolerated    Mobility  Bed Mobility Overal bed mobility: Modified Independent             General bed mobility comments: With [facility] elevated ~30 degrees, pt able to assume sitting EOB without physical assist or cueing.  Transfers Overall transfer level: Needs assistance Equipment used: Rolling walker (2 wheeled) Transfers: Sit to/from Stand Sit to Stand: Min guard            Ambulation/Gait Ambulation/Gait assistance: Min guard Ambulation Distance (Feet): 90 Feet Assistive device: Rolling walker (2 wheeled) Gait Pattern/deviations: Step-to pattern Gait velocity: Decreased Gait velocity interpretation: Below normal speed for age/gender General Gait Details: no loss of balance or buckling noted.  some minor dizziness with gait.   Stairs            Wheelchair Mobility    Modified Rankin (Stroke Patients Only)       Balance Overall balance assessment: Needs assistance Sitting-balance support: Feet supported Sitting balance-Leahy Scale: Good     Standing  balance support: Bilateral upper extremity supported Standing balance-Leahy Scale: Fair                      Cognition Arousal/Alertness: Awake/alert Behavior During Therapy: WFL for tasks assessed/performed Overall Cognitive Status: Within Functional Limits for tasks assessed                      Exercises Total Joint Exercises Ankle Circles/Pumps: AROM Quad Sets: AROM Gluteal Sets: AROM Short Arc Quad: AROM;Right;10 reps;Supine Heel Slides: AROM;Right;10 reps;Supine Hip ABduction/ADduction: AAROM;Right;10 reps;Supine Straight Leg Raises: AROM;Right;10 reps;Supine Long Arc Quad: AROM;Right;10 reps;Seated Knee Flexion: AROM;Right;5 reps;Seated    General Comments        Pertinent Vitals/Pain Pain Assessment: 0-10 Pain Score: 5  Pain Location: R knee Pain Intervention(s): Limited activity within patient's tolerance;Ice applied    Home Living                      Prior Function            PT Goals (current goals can now be found in the care plan section) Progress towards PT goals: Progressing toward goals    Frequency  BID    PT Plan Current plan remains appropriate    Co-evaluation             End of Session Equipment Utilized During Treatment: Gait belt Activity Tolerance: Patient tolerated treatment well Patient left: in bed;with call bell/phone within reach;with  bed alarm set;with family/visitor present     Time: [id number] PT Time Calculation (min) (ACUTE ONLY): 24 min  Charges:  $Gait Training: 8-22 mins $Therapeutic Exercise: 8-22 mins                    G Codes:      Chesley Noon, PTA 02/26/2016, 2:13 PM

## 2016-02-26 NOTE — Progress Notes (Signed)
Clinical Social Worker (CSW) received SNF consult. PT is recommending home health. RN Case Manager is aware of above. Please reconsult if future social work needs arise. CSW signing off.   Eliceo Gladu, LCSW [phone] 

## 2016-02-27 ENCOUNTER — Encounter: Payer: Self-pay | Admitting: Orthopedic Surgery

## 2016-02-27 LAB — BASIC METABOLIC PANEL
ANION GAP: 5 (ref 5–15)
BUN: 8 mg/dL (ref 6–20)
CHLORIDE: 102 mmol/L (ref 101–111)
CO2: 30 mmol/L (ref 22–32)
Calcium: 7.9 mg/dL — ABNORMAL LOW (ref 8.9–10.3)
Creatinine, Ser: 0.75 mg/dL (ref 0.61–1.24)
GFR calc non Af Amer: >60 mL/min (ref >60)
GLUCOSE: 110 mg/dL — AB (ref 65–99)
POTASSIUM: 4.3 mmol/L (ref 3.5–5.1)
Sodium: 137 mmol/L (ref 135–145)

## 2016-02-27 LAB — CBC
HEMATOCRIT: 34.4 % — AB (ref 40.0–52.0)
HEMOGLOBIN: 12 g/dL — AB (ref 13.0–18.0)
MCH: 31.4 pg (ref 26.0–34.0)
MCHC: 35 g/dL (ref 32.0–36.0)
MCV: 89.7 fL (ref 80.0–100.0)
Platelets: 148 10*3/uL — ABNORMAL LOW (ref 150–440)
RBC: 3.83 MIL/uL — ABNORMAL LOW (ref 4.40–5.90)
RDW: 13.9 % (ref 11.5–14.5)
WBC: 7.6 10*3/uL (ref 3.8–10.6)

## 2016-02-27 MED ORDER — OXYCODONE HCL 5 MG PO TABS: 5.0000 mg | ORAL_TABLET | ORAL | Status: DC | PRN

## 2016-02-27 MED ORDER — LACTULOSE 10 GM/15ML PO SOLN
10.0000 g | Freq: Two times a day (BID) | ORAL | Status: DC | PRN
  Administered 2016-02-27: 10 g via ORAL
  Filled 2016-02-27: qty 30

## 2016-02-27 MED ORDER — ENOXAPARIN SODIUM 40 MG/0.4ML ~~LOC~~ SOLN: 40.0000 mg | SUBCUTANEOUS | Status: DC

## 2016-02-27 MED ORDER — TRAMADOL HCL 50 MG PO TABS: 50.0000 mg | ORAL_TABLET | ORAL | Status: DC | PRN

## 2016-02-27 NOTE — Progress Notes (Signed)
PT Cancellation Note  Patient Details Name: Rodney Rojas MRN: [id number] DOB: Feb 10, 1961   Cancelled Treatment:    Reason Eval/Treat Not Completed: Patient declined, with c/o significant dizziness and nausea with any OOB activity. Pt reports attempting to get up to the bathroom with RN staff this morning and feeling like he was going to pass out, quickly returning to supine.   BP 123/76 mmHg in semi-supine; 115/67 mmHg sitting EOB. Pt with increasing dizziness sitting EOB and returned to supine. Pt declining any further treatment at this time. Per discussion with RN, Lambert Keto, this may be narcotic related. Will re-attempt PT treatment later today, as pt needs ROM measurement, gait around RN loop, and navigation of 2 steps prior to d/c.   Mairead Schwarzkopf, SPT 02/27/2016, 9:26 AM

## 2016-02-27 NOTE — Care Management (Signed)
Dr. Marry Guan and Wille Glaser PA notified that PT has been limited today due to "dizziness". Discharge may need to be held until tomorrow. Please assess this need and call Jon PA; Dr. Marry Guan is going into surgery right now.

## 2016-02-27 NOTE — Progress Notes (Signed)
Occupational Therapy Treatment Patient Details Name: Rodney Rojas MRN: [id number] DOB: 23-Mar-1961 Today's Date: 02/27/2016    History of present illness Pt is a 55 yr old male s/p elective R TKA 7/19. PMH significant for arthritis, GERD, tremors of nervous system, and h/o umbilical hernia with repair.   OT comments  Pt had drop in BP with dizziness when sitting EOB with PT a few minutes ago.  Met with patient and his wife about discussion and plan for AD for ADLs.  Education provided about safe set up for wife to assist with ADLs as needed to prevent injury and how to increase independence gradually as pain decreases and ROM increases.  Also reviewed options for using a transfer tub bench vs a shower chair when MD releases pt for showering.   Pt and his wife prefer to not use any AD and for his wife to assist with ADLs.  No further OT needs identified.  Will monitor for needs until DC home.   Follow Up Recommendations  No OT follow up    Equipment Recommendations  Tub/shower bench    Recommendations for Other Services      Precautions / Restrictions Precautions Precautions: Fall Restrictions Weight Bearing Restrictions: Yes RLE Weight Bearing: Weight bearing as tolerated       Mobility Bed Mobility                  Transfers                      Balance                                   ADL Overall ADL's : Needs assistance/impaired                                       General ADL Comments: Pt had drop in BP with dizziness when sitting EOB with PT a few minutes ago.  Met with patient and his wife about discussion and plan for AD for ADLs.  Education provided about safe set up for wife to assist with ADLs as needed to prevent injury and how to increase independence gradually as pain decreases and ROM increases.  Also reviewed options for using a transfer tub bench vs a shower chair when MD releases pt for showering.          Vision                     Perception     Praxis      Cognition   Behavior During Therapy: WFL for tasks assessed/performed Overall Cognitive Status: Within Functional Limits for tasks assessed                       Extremity/Trunk Assessment               Exercises     Shoulder Instructions       General Comments      Pertinent Vitals/ Pain       Pain Assessment: 0-10 Pain Score: 5  Pain Location: R knee Pain Descriptors / Indicators: Aching Pain Intervention(s): Limited activity within patient's tolerance;Monitored during session;Premedicated before session  Home Living  Prior Functioning/Environment              Frequency Min 1X/week     Progress Toward Goals  OT Goals(current goals can now be found in the care plan section)  Progress towards OT goals: Progressing toward goals  Acute Rehab OT Goals Patient Stated Goal: to reduce pain and be more active again OT Goal Formulation: With patient/family Time For Goal Achievement: 03/11/16 Potential to Achieve Goals: Good  Plan Discharge plan remains appropriate    Co-evaluation                 End of Session     Activity Tolerance Patient limited by pain;Other (comment) (limited in dizziness and BP dropping when sitting up )   Patient Left in bed;with call bell/phone within reach;with bed alarm set;with family/visitor present   Nurse Communication          Time: 1121-6244 OT Time Calculation (min): 26 min  Charges: OT General Charges $OT Visit: 1 Procedure OT Treatments $Self Care/Home Management : 23-37 mins   Chrys Racer, OTR/L ascom [phone]  02/27/2016, 9:59 AM

## 2016-02-27 NOTE — Discharge Instructions (Signed)

## 2016-02-27 NOTE — Care Management (Signed)
Lovenox $5. Rolling walker delivered to patient- [place]. [facility] notified of patient potential discharge to home today. Patient states he "feels dizzy". RNCM will continue to follow.

## 2016-02-27 NOTE — Care Management (Signed)
Waiting for [facility] to open to confirm Lovenox cost. Also waiting on rolling walker to be delivered. I have notified [facility] that patient has discharge to home today. RNCM will continue to follow.

## 2016-02-27 NOTE — Discharge Summary (Signed)
Physician Discharge Summary  Patient ID: Rodney Rojas MRN: [id number] DOB/AGE: 09/30/1960 55 y.o.  Admit date: 02/25/2016 Discharge date: 02/27/2016  Admission Diagnoses:  PRIMARY OSTEOARTHRITIS   Discharge Diagnoses: Patient Active Problem List   Diagnosis Date Noted  . S/P total knee arthroplasty 02/25/2016  . Chronic pain of left knee 02/24/2015  . GERD (gastroesophageal reflux disease) 02/24/2015  . Urinary hesitancy 02/24/2015  . Fam hx-ischem heart disease 09/01/2009  . Family history of prostate cancer 09/01/2009  . Umbilical hernia without obstruction and without gangrene 09/01/2009  . Benign essential tremor 01/14/2009  . Allergy to insects and arachnids 08/09/1998    Past Medical History  Diagnosis Date  . History of chicken pox   . Arthritis   . Tremors of nervous system   . Prostate enlargement   . Complication of anesthesia     slow to wake up after colonoscopy     Transfusion: Autovac transfusions given the first 6 hours postoperatively   Consultants (if any):  case management for home health assistance  Discharged Condition: Improved  Hospital Course: Rodney Rojas is an 55 y.o. male who was admitted 02/25/2016 with a diagnosis of degenerative arthrosis right knee and went to the operating room on 02/25/2016 and underwent the above named procedures.    Surgeries:Procedure(s): COMPUTER ASSISTED TOTAL KNEE ARTHROPLASTY on 02/25/2016  PRE-OPERATIVE DIAGNOSIS: Degenerative arthrosis of the right knee, primary  POST-OPERATIVE DIAGNOSIS: Same  PROCEDURE: Right total knee arthroplasty using computer-assisted navigation  SURGEON: Marciano Sequin. M.D.  ASSISTANT: Vance Peper, PA (present and scrubbed throughout the case, critical for assistance with exposure, retraction, instrumentation, and closure)  ANESTHESIA: spinal  ESTIMATED BLOOD LOSS: 50 mL  FLUIDS REPLACED: 1500 mL of crystalloid  TOURNIQUET TIME: 120 minutes  DRAINS: 2 medium  drains to a reinfusion system  SOFT TISSUE RELEASES: Anterior cruciate ligament, posterior cruciate ligament, deep medial collateral ligament, patellofemoral ligament   IMPLANTS UTILIZED: DePuy Attune size 6 posterior stabilized femoral component (cemented), size 7 rotating platform tibial component (cemented), 38 mm medialized dome patella (cemented), and a 6 mm stabilized rotating platform polyethylene insert.  INDICATIONS FOR SURGERY: Rodney Rojas is a 55 y.o. year old male with a long history of progressive knee pain. X-rays demonstrated severe degenerative changes in tricompartmental fashion. The patient had not seen any significant improvement despite conservative nonsurgical intervention. After discussion of the risks and benefits of surgical intervention, the patient expressed understanding of the risks benefits and agree with plans for total knee arthroplasty.   The risks, benefits, and alternatives were discussed at length including but not limited to the risks of infection, bleeding, nerve injury, stiffness, blood clots, the need for revision surgery, cardiopulmonary complications, among others, and they were willing to proceed. Patient tolerated the surgery well. No complications .Patient was taken to PACU where she was stabilized and then transferred to the orthopedic floor.  Patient started on Lovenox 30 q 12 hrs. Foot pumps applied bilaterally at 80 mm hg. Heels elevated off bed with rolled towels. No evidence of DVT. Calves non tender. Negative Homan. Physical therapy started on day #1 for gait training and transfer with OT starting on  day #1 for ADL and assisted devices. Patient has done well with therapy. Ambulated greater than 200 feet upon being discharged. Was able to go up and down 4 steps independently and safely.  Patient's IV and Foley were discontinued on day #1 with the Hemovac being discontinued on day #2. Dressing was changed on day #  2 prior to being discharged as  well.   He was given perioperative antibiotics:  Anti-infectives    Start     Dose/Rate Route Frequency Ordered Stop   02/25/16 1700  ceFAZolin (ANCEF) IVPB 2g/100 mL premix     2 g 200 mL/hr over 30 Minutes Intravenous Every 6 hours 02/25/16 1616 02/26/16 1121   02/25/16 1054  ceFAZolin (ANCEF) 2-4 GM/100ML-% IVPB    Comments:  Idamae Lusher: cabinet override      02/25/16 1054 02/25/16 2259   02/25/16 0555  ceFAZolin (ANCEF) IVPB 2g/100 mL premix     2 g 200 mL/hr over 30 Minutes Intravenous On call to O.R. 02/25/16 0555 02/25/16 1131    .  He was fitted with AV 1 compression foot pumps devices, instructed on heel pumps, early ambulation, and TED stockings bilaterally for DVT prophylaxis.  He benefited maximally from the hospital stay and there were no complications.    Recent vital signs:  Filed Vitals:   02/26/16 2038 02/27/16 0355  BP: 156/78 134/73  Pulse: 65 76  Temp: 98.4 F (36.9 C) 98.3 F (36.8 C)  Resp: 18 20    Recent laboratory studies:  Lab Results  Component Value Date   HGB 12.0* 02/27/2016   HGB 12.4* 02/26/2016   HGB 14.3 02/18/2016   Lab Results  Component Value Date   WBC 7.6 02/27/2016   PLT 148* 02/27/2016   Lab Results  Component Value Date   INR 1.03 02/18/2016   Lab Results  Component Value Date   NA 137 02/27/2016   K 4.3 02/27/2016   CL 102 02/27/2016   CO2 30 02/27/2016   BUN 8 02/27/2016   CREATININE 0.75 02/27/2016   GLUCOSE 110* 02/27/2016    Discharge Medications:     Medication List    TAKE these medications        bisoprolol 10 MG tablet  Commonly known as:  ZEBETA  take 1 tablet by mouth once daily     CIALIS 5 MG tablet  Generic drug:  tadalafil  Take 1 tablet by mouth daily as needed for erectile dysfunction.     enoxaparin 40 MG/0.4ML injection  Commonly known as:  LOVENOX  Inject 0.4 mLs (40 mg total) into the skin daily.     EPINEPHrine 0.3 mg/0.3 mL Soaj injection  Commonly known as:  EPIPEN 2-PAK   Inject 0.3 mLs (0.3 mg total) into the muscle once.     meloxicam 7.5 MG tablet  Commonly known as:  MOBIC  Take 1 tablet by mouth 2 (two) times daily.     oxyCODONE 5 MG immediate release tablet  Commonly known as:  Oxy IR/ROXICODONE  Take 1-2 tablets (5-10 mg total) by mouth every 4 (four) hours as needed for severe pain or breakthrough pain.     primidone 50 MG tablet  Commonly known as:  MYSOLINE  Take 1-2 tablets (50-100 mg total) by mouth at bedtime as needed.     tamsulosin 0.4 MG Caps capsule  Commonly known as:  FLOMAX  take 1 capsule by mouth once daily 1/2 HOUR after A MEAL     TESTOSTERONE IM  Reported on 08/05/2015     traMADol 50 MG tablet  Commonly known as:  ULTRAM  Take 1-2 tablets (50-100 mg total) by mouth every 4 (four) hours as needed for moderate pain.        Diagnostic Studies: Dg Knee Right Port  02/25/2016  CLINICAL DATA:  Right knee  replacement. EXAM: PORTABLE RIGHT KNEE - 1-2 VIEW COMPARISON:  Right knee MR dated 11/19/2015. FINDINGS: Interval right total knee prosthesis in satisfactory position and alignment. No fracture or dislocation seen. IMPRESSION: Satisfactory postoperative appearance of a right total knee prosthesis. Electronically Signed   By: Claudie Revering M.D.   On: 02/25/2016 15:36    Disposition: 01-Home or Self Care      Discharge Instructions    Diet - low sodium heart healthy    Complete by:  As directed      Increase activity slowly    Complete by:  As directed            Follow-up Information    Follow up with [facility] R., PA On 03/11/2016.   Specialty:  Physician Assistant   Why:  at 9:30am   Contact information:   [address] [facility] [place] [address] [phone]       Follow up with Dereck Leep, MD On 04/08/2016.   Specialty:  Orthopedic Surgery   Why:  at 11:15am   Contact information:   [place] [address] [phone]         Signed: Watt Climes 02/27/2016, 7:31 AM

## 2016-02-27 NOTE — Progress Notes (Signed)
Physical Therapy Treatment Patient Details Name: Rodney Rojas MRN: [id number] DOB: 02-05-1961 Today's Date: 02/27/2016    History of Present Illness Pt is a 55 yr old male s/p elective R TKA 7/19. PMH significant for arthritis, GERD, tremors of nervous system, and h/o umbilical hernia with repair.    PT Comments    Pt has been significantly limited this morning by c/o dizziness and nausea. Pt reports feeling slightly better with my second attempt, but BP dropped from 135/71 mmHg in supine to 102/69 mmHg in standing and pt symptomatic ("it's not necessarily dizziness I just don't feel right"). Pt able to participate in supine TherEx and goniometric measurement of the R knee (-5 to 90 degrees), but ambulating the unit and navigating stairs were deferred d/t symptoms. RN in room and encouraging fluids. Will attempt to progress ambulation/stairs this afternoon as pt tolerates.   Follow Up Recommendations  Home health PT     Equipment Recommendations  Rolling walker with 5" wheels    Recommendations for Other Services       Precautions / Restrictions Precautions Precautions: Fall Restrictions Weight Bearing Restrictions: Yes RLE Weight Bearing: Weight bearing as tolerated    Mobility  Bed Mobility Overal bed mobility: Modified Independent General bed mobility comments: From flat surface and without use of bed rails, pt able to achieve supine > sit independently  Transfers Overall transfer level: Needs assistance Equipment used: Rolling walker (2 wheeled) Transfers: Sit to/from Stand Sit to Stand: Min guard General transfer comment: Min guard for safety, no physical assist required. In standing, pt with drop in blood pressure as detailed below. Thus, transfer to chair initiated.  Ambulation/Gait Ambulation/Gait assistance: Min guard Ambulation Distance (Feet): 3 Feet Assistive device: Rolling walker (2 wheeled) Gait Pattern/deviations: Antalgic;Step-to pattern;Decreased  stance time - right Gait velocity: Decreased General Gait Details: Pt able to ambulate from bed > chair with RW and min guard for safety. Due to blood pressure, further ambualtion deferred at this time.   Stairs    Wheelchair Mobility    Modified Rankin (Stroke Patients Only)          Cognition Arousal/Alertness: Awake/alert Behavior During Therapy: WFL for tasks assessed/performed Overall Cognitive Status: Within Functional Limits for tasks assessed    Exercises Total Joint Exercises Ankle Circles/Pumps: AROM;Both Quad Sets: AROM;Both Gluteal Sets: AROM;Both Short Arc Quad: AROM;Right Heel Slides: AROM;Right Hip ABduction/ADduction: AROM;Right Straight Leg Raises: AROM;Right All exercises performed x 10 reps in supine.  Knee Flexion: AROM;Right; 5 repetitions; sitting  Goniometric ROM: R knee extension in semi-supine lacking 5 degrees from neutral. R knee flexion in sitting 90 degrees.    General Comments General comments (skin integrity, edema, etc.): Honeycomb dressing in tact with scant drainage R LE.      Pertinent Vitals/Pain Pain Assessment: 0-10 Pain Score: 6  Pain Location: R knee Pain Descriptors / Indicators: Aching Pain Intervention(s): Limited activity within patient's tolerance;Monitored during session;Repositioned;Ice applied (RN notified)   Pt monitored for orthostatic hypotension and presented as follows: Semi-supine:135/71 mmHg Sitting EOB: 115/72 mmHg Standing: 102/69 mmHg End of session (in chair): 117/68 mmHg  HR and O2 monitored throughout session and maintained WFL.            PT Goals (current goals can now be found in the care plan section) Acute Rehab PT Goals Patient Stated Goal: to reduce pain and be more active again PT Goal Formulation: With patient Time For Goal Achievement: 03/11/16 Potential to Achieve Goals: Good Progress towards PT goals: Progressing  toward goals    Frequency  BID    PT Plan Current plan  remains appropriate       End of Session Equipment Utilized During Treatment: Gait belt Activity Tolerance: Patient tolerated treatment well;Treatment limited secondary to medical complications (Comment) (orthostatic hypotension) Patient left: in chair;with call bell/phone within reach;with chair alarm set;with family/visitor present;with nursing/sitter in room;with SCD's reapplied (bone foam in place RLE, L heel elevated on towel roll, polar care running)     Time: [id number] PT Time Calculation (min) (ACUTE ONLY): 33 min  Charges:                       G Codes:      Rodriguez Aguinaldo, SPT  02/27/2016, 1:16 PM

## 2016-02-27 NOTE — Progress Notes (Signed)
Physical Therapy Treatment Patient Details Name: Rodney Rojas MRN: [id number] DOB: 1961/05/23 Today's Date: 02/27/2016    History of Present Illness Pt is a 55 yr old male s/p elective R TKA 7/19. PMH significant for arthritis, GERD, tremors of nervous system, and h/o umbilical hernia with repair.    PT Comments    Pt making progress towards all goals. Pt is independent with bed mobility from flat surface, supervision for sit <> stand transfers, and CGA for ambulation x255ft and negotiation of 2 steps using RW. Pt still exhibiting orthostatic hypotension, demonstrated by decrease in blood pressure from 129/73 mmHg sitting EOB to 103/74 mmHg immediately following ambulation this session, though pt remained asymptomatic. Pt's RW was delivered to room and fit to patient by this PT. Pt exhibits safe use of RW with all mobility.    Follow Up Recommendations  Home health PT     Equipment Recommendations  Rolling walker with 5" wheels    Recommendations for Other Services       Precautions / Restrictions Precautions Precautions: Fall Restrictions Weight Bearing Restrictions: Yes RLE Weight Bearing: Weight bearing as tolerated    Mobility  Bed Mobility Overal bed mobility: Modified Independent General bed mobility comments: From flat surface and without use of bed rails, pt able to achieve sit > supine independently  Transfers Overall transfer level: Needs assistance Equipment used: Rolling walker (2 wheeled) Transfers: Sit to/from Stand (x 2 trials) Sit to Stand: Supervision General transfer comment: Supervision for safety, no physical assist required. Slight drop in blood pressure with standing as detailed below, pt asymptomatic.  Ambulation/Gait Ambulation/Gait assistance: Min guard Ambulation Distance (Feet): 250 Feet Assistive device: Rolling walker (2 wheeled) Gait Pattern/deviations: Step-to pattern;Step-through pattern;Decreased step length - left;Decreased stance  time - right;Antalgic Gait velocity: Decreased General Gait Details: Pt able to ambulate RN loop without physical assist. CGA and chair follow for safety. No LOB, buckling, or significant deviations noted. Pt reporting feeling "not himself but not dizzy" with gait.   Stairs Stairs: Yes Stairs assistance: Min guard Stair Management: No rails;With walker Number of Stairs: 2 General stair comments: Pt educated in proper stair technique and both verbalizes and demonstrates safety navigating 2 platform steps as required for home entry.        Cognition Arousal/Alertness: Awake/alert Behavior During Therapy: WFL for tasks assessed/performed Overall Cognitive Status: Within Functional Limits for tasks assessed       General Comments General comments (skin integrity, edema, etc.): honeycomb dressing in tact with scant drainage R LE.      Pertinent Vitals/Pain Pain Assessment: 0-10 Pain Score: 6  Pain Location: R knee Pain Descriptors / Indicators: Aching Pain Intervention(s): Limited activity within patient's tolerance;Monitored during session;Repositioned;Ice applied   Vitals monitored and presented as follows: Sitting EOB: 129/73 mmHg     65 bpm     100% Standing: 115/68 mmHg Post-gait: 103/74 mmHg          67 bpm      97% Supine: 116/63 mmHg             64 bpm     100% Pt reporting no dizziness at any point during the session, at one point states "I just don't feel like myself, not dizzy though, I think it's the pain medicine"           PT Goals (current goals can now be found in the care plan section) Acute Rehab PT Goals Patient Stated Goal: to reduce pain and be more active  again PT Goal Formulation: With patient Time For Goal Achievement: 03/11/16 Potential to Achieve Goals: Good Progress towards PT goals: Progressing toward goals    Frequency  BID    PT Plan Current plan remains appropriate       End of Session Equipment Utilized During Treatment: Gait  belt Activity Tolerance: Patient tolerated treatment well Patient left: in bed;with call bell/phone within reach;with bed alarm set;with family/visitor present;with SCD's reapplied (bone foam in place RLE, L heel elevated on towel roll, polar care running)     Time: 1456-1520 PT Time Calculation (min) (ACUTE ONLY): 24 min  Charges:                    G Codes:      Aman Batley, SPT 02/27/2016, 3:49 PM

## 2016-02-27 NOTE — Progress Notes (Signed)
   Subjective: 2 Days Post-Op Procedure(s) (LRB): COMPUTER ASSISTED TOTAL KNEE ARTHROPLASTY (Right) Patient reports pain as 5 on 0-10 scale.   Patient is well, and has had no acute complaints or problems Continue with physical therapy today.  Plan is to go Home after hospital stay. no nausea and no vomiting Patient denies any chest pains or shortness of breath. Objective: Vital signs in last 24 hours: Temp:  [97.7 F (36.5 C)-98.4 F (36.9 C)] 98.3 F (36.8 C) (07/21 0355) Pulse Rate:  [61-76] 76 (07/21 0355) Resp:  [17-20] 20 (07/21 0355) BP: (124-159)/(73-85) 134/73 mmHg (07/21 0355) SpO2:  [95 %-100 %] 95 % (07/21 0355) well approximated incision Heels are non tender and elevated off the bed using rolled towels Intake/Output from previous day: 07/20 0701 - 07/21 0700 In: 720 [P.O.:720] Out: 2760 [Urine:2400; Drains:360] Intake/Output this shift:     Recent Labs  02/26/16 0458 02/27/16 0529  HGB 12.4* 12.0*    Recent Labs  02/26/16 0458 02/27/16 0529  WBC 9.9 7.6  RBC 4.01* 3.83*  HCT 35.4* 34.4*  PLT 156 148*    Recent Labs  02/26/16 0458 02/27/16 0529  NA 135 137  K 4.2 4.3  CL 102 102  CO2 27 30  BUN 10 8  CREATININE 0.80 0.75  GLUCOSE 120* 110*  CALCIUM 7.6* 7.9*   No results for input(s): LABPT, INR in the last 72 hours.  EXAM General - Patient is Alert, Appropriate and Oriented Extremity - Neurologically intact Neurovascular intact Sensation intact distally Intact pulses distally Dorsiflexion/Plantar flexion intact Compartment soft Dressing - dressing C/D/I Motor Function - intact, moving foot and toes well on exam  Past Medical History  Diagnosis Date  . History of chicken pox   . Arthritis   . Tremors of nervous system   . Prostate enlargement   . Complication of anesthesia     slow to wake up after colonoscopy    Assessment/Plan: 2 Days Post-Op Procedure(s) (LRB): COMPUTER ASSISTED TOTAL KNEE ARTHROPLASTY (Right) Active  Problems:   S/P total knee arthroplasty  Estimated body mass index is 30.87 kg/(m^2) as calculated from the following:   Height as of this encounter: 5\' 10"  (1.778 m).   Weight as of this encounter: 97.6 kg (215 lb 2.7 oz). Up with therapy Discharge home with home health  Labs: reviewed DVT Prophylaxis - Lovenox, Foot Pumps and TED hose Weight-Bearing as tolerated to right leg Patient needs to have a bowel movement today stating he can go home Patient needs to do the lap around the nurse's desk and steps as to go home. Hemovac was discontinued on today's visit Bone foam will go home with patient today.  Jillyn Ledger. [place] 02/27/2016, 7:25 AM

## 2016-02-27 NOTE — Progress Notes (Signed)
Spoke with Vance Peper PA regarding patients c/o feeling dizzy. I explained the patient cleared up mid afternoon and was able to do steps and walk around the nurses station. Patients states he feels fine and is asymptomatic. His BP is 103/74. Okay to be discharged per Vance Peper. Advised patient and wife to take only one tramadol at a time and reiterated that he was able to manage steps and walking around the nurses station on two tylenol. Patient and his wife agree with the plan of care.

## 2016-02-29 DIAGNOSIS — Z96651 Presence of right artificial knee joint: Secondary | ICD-10-CM | POA: Diagnosis not present

## 2016-02-29 DIAGNOSIS — Z79891 Long term (current) use of opiate analgesic: Secondary | ICD-10-CM | POA: Diagnosis not present

## 2016-02-29 DIAGNOSIS — G250 Essential tremor: Secondary | ICD-10-CM | POA: Diagnosis not present

## 2016-02-29 DIAGNOSIS — Z471 Aftercare following joint replacement surgery: Secondary | ICD-10-CM | POA: Diagnosis not present

## 2016-02-29 DIAGNOSIS — Z7901 Long term (current) use of anticoagulants: Secondary | ICD-10-CM | POA: Diagnosis not present

## 2016-03-01 DIAGNOSIS — Z79891 Long term (current) use of opiate analgesic: Secondary | ICD-10-CM | POA: Diagnosis not present

## 2016-03-01 DIAGNOSIS — Z96651 Presence of right artificial knee joint: Secondary | ICD-10-CM | POA: Diagnosis not present

## 2016-03-01 DIAGNOSIS — Z7901 Long term (current) use of anticoagulants: Secondary | ICD-10-CM | POA: Diagnosis not present

## 2016-03-01 DIAGNOSIS — G250 Essential tremor: Secondary | ICD-10-CM | POA: Diagnosis not present

## 2016-03-01 DIAGNOSIS — Z471 Aftercare following joint replacement surgery: Secondary | ICD-10-CM | POA: Diagnosis not present

## 2016-03-03 DIAGNOSIS — Z79891 Long term (current) use of opiate analgesic: Secondary | ICD-10-CM | POA: Diagnosis not present

## 2016-03-03 DIAGNOSIS — Z471 Aftercare following joint replacement surgery: Secondary | ICD-10-CM | POA: Diagnosis not present

## 2016-03-03 DIAGNOSIS — Z7901 Long term (current) use of anticoagulants: Secondary | ICD-10-CM | POA: Diagnosis not present

## 2016-03-03 DIAGNOSIS — G250 Essential tremor: Secondary | ICD-10-CM | POA: Diagnosis not present

## 2016-03-03 DIAGNOSIS — Z96651 Presence of right artificial knee joint: Secondary | ICD-10-CM | POA: Diagnosis not present

## 2016-03-04 ENCOUNTER — Other Ambulatory Visit: Payer: Self-pay | Admitting: Family Medicine

## 2016-03-04 DIAGNOSIS — Z96651 Presence of right artificial knee joint: Secondary | ICD-10-CM | POA: Diagnosis not present

## 2016-03-04 DIAGNOSIS — Z79891 Long term (current) use of opiate analgesic: Secondary | ICD-10-CM | POA: Diagnosis not present

## 2016-03-04 DIAGNOSIS — G250 Essential tremor: Secondary | ICD-10-CM

## 2016-03-04 DIAGNOSIS — Z7901 Long term (current) use of anticoagulants: Secondary | ICD-10-CM | POA: Diagnosis not present

## 2016-03-04 DIAGNOSIS — Z471 Aftercare following joint replacement surgery: Secondary | ICD-10-CM | POA: Diagnosis not present

## 2016-03-08 ENCOUNTER — Other Ambulatory Visit: Payer: Self-pay

## 2016-03-08 DIAGNOSIS — G250 Essential tremor: Secondary | ICD-10-CM | POA: Diagnosis not present

## 2016-03-08 DIAGNOSIS — Z79891 Long term (current) use of opiate analgesic: Secondary | ICD-10-CM | POA: Diagnosis not present

## 2016-03-08 DIAGNOSIS — Z7901 Long term (current) use of anticoagulants: Secondary | ICD-10-CM | POA: Diagnosis not present

## 2016-03-08 DIAGNOSIS — Z96651 Presence of right artificial knee joint: Secondary | ICD-10-CM | POA: Diagnosis not present

## 2016-03-08 DIAGNOSIS — Z471 Aftercare following joint replacement surgery: Secondary | ICD-10-CM | POA: Diagnosis not present

## 2016-03-08 MED ORDER — PRIMIDONE 50 MG PO TABS: 50.0000 mg | ORAL_TABLET | Freq: Every evening | ORAL | 5 refills | Status: DC | PRN

## 2016-03-08 NOTE — Telephone Encounter (Signed)
Patient's wife reports that the patient has been out of medication for the last 3 days. She reports that the pharmacy gave them medication until Dr. Caryn Section could have this filled. Advised them that Dr. Caryn Section has been out of the office. Does this medication need a prior auth? Per patient's wife, it needed to be "approved". Please advise. Thanks!

## 2016-03-08 NOTE — Telephone Encounter (Signed)
Patient sent e mail requesting refill on this medication. He states the pharmacy sent 2 refill request last week and no one has responded. 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

## 2016-03-10 DIAGNOSIS — G250 Essential tremor: Secondary | ICD-10-CM | POA: Diagnosis not present

## 2016-03-10 DIAGNOSIS — Z79891 Long term (current) use of opiate analgesic: Secondary | ICD-10-CM | POA: Diagnosis not present

## 2016-03-10 DIAGNOSIS — Z471 Aftercare following joint replacement surgery: Secondary | ICD-10-CM | POA: Diagnosis not present

## 2016-03-10 DIAGNOSIS — Z96651 Presence of right artificial knee joint: Secondary | ICD-10-CM | POA: Diagnosis not present

## 2016-03-10 DIAGNOSIS — Z7901 Long term (current) use of anticoagulants: Secondary | ICD-10-CM | POA: Diagnosis not present

## 2016-03-11 DIAGNOSIS — Z96651 Presence of right artificial knee joint: Secondary | ICD-10-CM | POA: Diagnosis not present

## 2016-03-15 DIAGNOSIS — Z96651 Presence of right artificial knee joint: Secondary | ICD-10-CM | POA: Diagnosis not present

## 2016-03-17 DIAGNOSIS — Z96651 Presence of right artificial knee joint: Secondary | ICD-10-CM | POA: Diagnosis not present

## 2016-03-19 DIAGNOSIS — Z96651 Presence of right artificial knee joint: Secondary | ICD-10-CM | POA: Diagnosis not present

## 2016-03-22 DIAGNOSIS — Z96651 Presence of right artificial knee joint: Secondary | ICD-10-CM | POA: Diagnosis not present

## 2016-03-24 DIAGNOSIS — Z96651 Presence of right artificial knee joint: Secondary | ICD-10-CM | POA: Diagnosis not present

## 2016-03-26 DIAGNOSIS — Z96651 Presence of right artificial knee joint: Secondary | ICD-10-CM | POA: Diagnosis not present

## 2016-03-29 DIAGNOSIS — Z96651 Presence of right artificial knee joint: Secondary | ICD-10-CM | POA: Diagnosis not present

## 2016-03-31 DIAGNOSIS — Z96651 Presence of right artificial knee joint: Secondary | ICD-10-CM | POA: Diagnosis not present

## 2016-04-02 DIAGNOSIS — Z96651 Presence of right artificial knee joint: Secondary | ICD-10-CM | POA: Diagnosis not present

## 2016-04-05 DIAGNOSIS — Z96651 Presence of right artificial knee joint: Secondary | ICD-10-CM | POA: Diagnosis not present

## 2016-04-08 DIAGNOSIS — Z96651 Presence of right artificial knee joint: Secondary | ICD-10-CM | POA: Diagnosis not present

## 2016-04-13 DIAGNOSIS — Z96651 Presence of right artificial knee joint: Secondary | ICD-10-CM | POA: Diagnosis not present

## 2016-04-15 DIAGNOSIS — Z96651 Presence of right artificial knee joint: Secondary | ICD-10-CM | POA: Diagnosis not present

## 2016-04-19 DIAGNOSIS — Z96651 Presence of right artificial knee joint: Secondary | ICD-10-CM | POA: Diagnosis not present

## 2016-04-21 ENCOUNTER — Encounter: Payer: Self-pay | Admitting: Emergency Medicine

## 2016-04-21 ENCOUNTER — Emergency Department
Admission: EM | Admit: 2016-04-21 | Discharge: 2016-04-21 | Disposition: A | Discharge status: Home / Self Care | Payer: BLUE CROSS/BLUE SHIELD | Attending: Emergency Medicine | Admitting: Emergency Medicine

## 2016-04-21 DIAGNOSIS — Z79899 Other long term (current) drug therapy: Secondary | ICD-10-CM | POA: Diagnosis not present

## 2016-04-21 DIAGNOSIS — W2203XA Walked into furniture, initial encounter: Secondary | ICD-10-CM | POA: Insufficient documentation

## 2016-04-21 DIAGNOSIS — Y999 Unspecified external cause status: Secondary | ICD-10-CM | POA: Insufficient documentation

## 2016-04-21 DIAGNOSIS — S61211A Laceration without foreign body of left index finger without damage to nail, initial encounter: Secondary | ICD-10-CM | POA: Diagnosis not present

## 2016-04-21 DIAGNOSIS — Z7901 Long term (current) use of anticoagulants: Secondary | ICD-10-CM | POA: Insufficient documentation

## 2016-04-21 DIAGNOSIS — Y9389 Activity, other specified: Secondary | ICD-10-CM | POA: Insufficient documentation

## 2016-04-21 DIAGNOSIS — Y929 Unspecified place or not applicable: Secondary | ICD-10-CM | POA: Insufficient documentation

## 2016-04-21 DIAGNOSIS — Z791 Long term (current) use of non-steroidal anti-inflammatories (NSAID): Secondary | ICD-10-CM | POA: Diagnosis not present

## 2016-04-21 MED ORDER — CEPHALEXIN 500 MG PO CAPS: 500.0000 mg | ORAL_CAPSULE | Freq: Four times a day (QID) | ORAL | 0 refills | Status: AC

## 2016-04-21 MED ORDER — LIDOCAINE HCL (PF) 1 % IJ SOLN
INTRAMUSCULAR | Status: AC
  Filled 2016-04-21: qty 5

## 2016-04-21 MED ORDER — LIDOCAINE HCL (PF) 1 % IJ SOLN
2.0000 mL | Freq: Once | INTRAMUSCULAR | Status: AC
  Administered 2016-04-21: 2 mL
  Filled 2016-04-21: qty 5

## 2016-04-21 NOTE — ED Triage Notes (Signed)
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

## 2016-04-21 NOTE — ED Provider Notes (Signed)
[facility] Provider Note   CSN: [id number] Arrival date & time: 04/21/16  1656     History   Chief Complaint No chief complaint on file.   HPI Rodney Rojas is a 55 y.o. male presents to the emergency department for evaluation of left index finger laceration. Patient states just prior to arrival he was moving a piece of furniture, the foot rest kicked out, he cut his left index finger along the pad with a piece of metal under the furniture. Patient had significant bleeding but is now well controlled. His tetanus is up-to-date. He denies any numbness or tingling or limited range of motion. Pain is mild. He has not had any medications for pain.  HPI  Past Medical History:  Diagnosis Date  . Arthritis   . Complication of anesthesia    slow to wake up after colonoscopy  . History of chicken pox   . Prostate enlargement   . Tremors of nervous system     Patient Active Problem List   Diagnosis Date Noted  . S/P total knee arthroplasty 02/25/2016  . Chronic pain of left knee 02/24/2015  . GERD (gastroesophageal reflux disease) 02/24/2015  . Urinary hesitancy 02/24/2015  . Fam hx-ischem heart disease 09/01/2009  . Family history of prostate cancer 09/01/2009  . Umbilical hernia without obstruction and without gangrene 09/01/2009  . Benign essential tremor 01/14/2009  . Allergy to insects and arachnids 08/09/1998    Past Surgical History:  Procedure Laterality Date  . CARDIOVASCULAR STRESS TEST  12/28/2013   Normal; [facility]  . CYSTOSCOPY  1994  . HERNIA REPAIR  0000000   umbilical hernia repair; Dr. Pat Patrick  . KNEE ARTHROPLASTY Right 02/25/2016   Procedure: COMPUTER ASSISTED TOTAL KNEE ARTHROPLASTY;  Surgeon: Dereck Leep, MD;  Location: [facility] ORS;  Service: Orthopedics;  Laterality: Right;  . REPLACEMENT TOTAL KNEE Right   . VASECTOMY  2000       Home Medications    Prior to Admission medications   Medication Sig Start Date End Date Taking?  Authorizing Provider  bisoprolol (ZEBETA) 10 MG tablet take 1 tablet by mouth once daily 10/29/15   Birdie Sons, MD  cephALEXin (KEFLEX) 500 MG capsule Take 1 capsule (500 mg total) by mouth 4 (four) times daily. 04/21/16 04/28/16  Duanne Guess, PA-C  enoxaparin (LOVENOX) 40 MG/0.4ML injection Inject 0.4 mLs (40 mg total) into the skin daily. 02/27/16   Watt Climes, PA  EPINEPHrine (EPIPEN 2-PAK) 0.3 mg/0.3 mL IJ SOAJ injection Inject 0.3 mLs (0.3 mg total) into the muscle once. Patient taking differently: Inject 0.3 mg into the muscle once. As needed for allergic reaction 02/24/15   Birdie Sons, MD  meloxicam (MOBIC) 7.5 MG tablet Take 1 tablet by mouth 2 (two) times daily. 01/16/16   Historical Provider, MD  oxyCODONE (OXY IR/ROXICODONE) 5 MG immediate release tablet Take 1-2 tablets (5-10 mg total) by mouth every 4 (four) hours as needed for severe pain or breakthrough pain. 02/27/16   Watt Climes, PA  primidone (MYSOLINE) 50 MG tablet TAKE 1 TO 2 TABLETS BY MOUTH AT BEDTIME AS NEEDED 03/08/16   Birdie Sons, MD  primidone (MYSOLINE) 50 MG tablet Take 1-2 tablets (50-100 mg total) by mouth at bedtime as needed. 03/08/16   Carmon Ginsberg, PA  tadalafil (CIALIS) 5 MG tablet Take 1 tablet by mouth daily as needed for erectile dysfunction.     Historical Provider, MD  tamsulosin (FLOMAX) 0.4 MG CAPS capsule  take 1 capsule by mouth once daily 1/2 HOUR after A MEAL 06/24/15   Historical Provider, MD  TESTOSTERONE IM Reported on 08/05/2015    Historical Provider, MD  traMADol (ULTRAM) 50 MG tablet Take 1-2 tablets (50-100 mg total) by mouth every 4 (four) hours as needed for moderate pain. 02/27/16   Watt Climes, PA    Family History Family History  Problem Relation Age of Onset  . Heart Problems Father     had 3V CABG in his 47's  . Diabetes Father   . Hypertension Father   . Cirrhosis Father     Social History Social History  Substance Use Topics  . Smoking status: Never Smoker  .  Smokeless tobacco: Never Used  . Alcohol use 0.6 oz/week    1 Cans of beer per week     Comment: occasional use     Allergies   Bee venom   Review of Systems Review of Systems  Constitutional: Negative for chills and fever.  HENT: Negative for ear pain and sore throat.   Eyes: Negative for pain and visual disturbance.  Respiratory: Negative for cough and shortness of breath.   Cardiovascular: Negative for chest pain and palpitations.  Gastrointestinal: Negative for abdominal pain and vomiting.  Genitourinary: Negative for dysuria and hematuria.  Musculoskeletal: Negative for arthralgias and back pain.  Skin: Positive for wound. Negative for color change and rash.  Neurological: Negative for seizures and syncope.  All other systems reviewed and are negative.    Physical Exam Updated Vital Signs BP 129/81 (BP Location: Right Arm)   Pulse (!) 59   Temp 98 F (36.7 C) (Oral)   Resp 20   Ht 5\' 9"  (1.753 m)   Wt 97.5 kg   SpO2 98%   BMI 31.75 kg/m   Physical Exam  Constitutional: He appears well-developed and well-nourished.  HENT:  Head: Normocephalic and atraumatic.  Eyes: Conjunctivae are normal.  Neck: Neck supple.  Cardiovascular: Normal rate and regular rhythm.   No murmur heard. Pulmonary/Chest: Effort normal and breath sounds normal. No respiratory distress.  Abdominal: Soft. There is no tenderness.  Musculoskeletal:  Examination of the left hand index finger shows patient has a volar laceration, flap towards the pad of the left index finger. Bleeding is well-controlled, no sign of foreign body. He has full active flexion and extension of the DIP joint as well as PIP joint. No nail injury noted.  Neurological: He is alert.  Skin: Skin is warm and dry.  Psychiatric: He has a normal mood and affect.  Nursing note and vitals reviewed.    ED Treatments / Results  Labs (all labs ordered are listed, but only abnormal results are displayed) Labs Reviewed - No  data to display  EKG  EKG Interpretation None       Radiology No results found.  Procedures Procedures (including critical care time) LACERATION REPAIR Performed by: Feliberto Gottron Authorized by: Feliberto Gottron Consent: Verbal consent obtained. Risks and benefits: risks, benefits and alternatives were discussed Consent given by: patient Patient identity confirmed: provided demographic data Prepped and Draped in normal sterile fashion Wound explored  Laceration Location: Index finger  Laceration Length: 2 cm flap  No Foreign Bodies seen or palpated  Anesthesia: local infiltration  Local anesthetic: lidocaine 1 % without epinephrine  Anesthetic total: 5 ml topical solution   Irrigation method: syringe Amount of cleaning: standard  Skin closure: Dermabond   Number of sutures: 0   Technique:  Dermabond with simple finger tourniquet   Patient tolerance: Patient tolerated the procedure well with no immediate complications.   Medications Ordered in ED Medications  lidocaine (PF) (XYLOCAINE) 1 % injection (not administered)  lidocaine (PF) (XYLOCAINE) 1 % injection 2 mL (2 mLs Infiltration Given 04/21/16 1800)     Initial Impression / Assessment and Plan / ED Course  I have reviewed the triage vital signs and the nursing notes.  Pertinent labs & imaging results that were available during my care of the patient were reviewed by me and considered in my medical decision making (see chart for details).  Clinical Course    54 year old male with left index finger laceration. Small flap. Finger was soaked in Betadine, saline solution. Wound was explored in a bloodless field small tourniquet was applied to the index finger. Dermabond was applied to the finger, several layers. Patient will keep clean and dry. He is placed on Keflex for infection prophylaxis.  Final Clinical Impressions(s) / ED Diagnoses   Final diagnoses:  Laceration of index  finger of left hand without complication, initial encounter    New Prescriptions New Prescriptions   CEPHALEXIN (KEFLEX) 500 MG CAPSULE    Take 1 capsule (500 mg total) by mouth 4 (four) times daily.     Duanne Guess, PA-C 04/21/16 1858    Nena Polio, MD 04/30/16 1311

## 2016-04-22 DIAGNOSIS — Z96651 Presence of right artificial knee joint: Secondary | ICD-10-CM | POA: Diagnosis not present

## 2016-04-26 DIAGNOSIS — Z96651 Presence of right artificial knee joint: Secondary | ICD-10-CM | POA: Diagnosis not present

## 2016-04-29 DIAGNOSIS — Z96651 Presence of right artificial knee joint: Secondary | ICD-10-CM | POA: Diagnosis not present

## 2016-05-02 ENCOUNTER — Other Ambulatory Visit: Payer: Self-pay | Admitting: Family Medicine

## 2016-05-06 DIAGNOSIS — Z96651 Presence of right artificial knee joint: Secondary | ICD-10-CM | POA: Diagnosis not present

## 2016-05-10 DIAGNOSIS — Z96651 Presence of right artificial knee joint: Secondary | ICD-10-CM | POA: Diagnosis not present

## 2016-05-17 DIAGNOSIS — Z96651 Presence of right artificial knee joint: Secondary | ICD-10-CM | POA: Diagnosis not present

## 2016-05-24 DIAGNOSIS — Z96651 Presence of right artificial knee joint: Secondary | ICD-10-CM | POA: Diagnosis not present

## 2016-08-24 DIAGNOSIS — L570 Actinic keratosis: Secondary | ICD-10-CM | POA: Diagnosis not present

## 2016-11-04 ENCOUNTER — Other Ambulatory Visit: Payer: Self-pay | Admitting: Family Medicine

## 2016-11-16 DIAGNOSIS — E291 Testicular hypofunction: Secondary | ICD-10-CM | POA: Diagnosis not present

## 2016-11-16 DIAGNOSIS — N401 Enlarged prostate with lower urinary tract symptoms: Secondary | ICD-10-CM | POA: Diagnosis not present

## 2016-11-16 DIAGNOSIS — Z79899 Other long term (current) drug therapy: Secondary | ICD-10-CM | POA: Diagnosis not present

## 2016-11-29 DIAGNOSIS — E291 Testicular hypofunction: Secondary | ICD-10-CM | POA: Diagnosis not present

## 2016-11-29 DIAGNOSIS — N401 Enlarged prostate with lower urinary tract symptoms: Secondary | ICD-10-CM | POA: Diagnosis not present

## 2016-11-29 DIAGNOSIS — N5201 Erectile dysfunction due to arterial insufficiency: Secondary | ICD-10-CM | POA: Diagnosis not present

## 2016-12-08 ENCOUNTER — Other Ambulatory Visit: Payer: Self-pay | Admitting: Family Medicine

## 2016-12-23 DIAGNOSIS — M5412 Radiculopathy, cervical region: Secondary | ICD-10-CM | POA: Diagnosis not present

## 2016-12-23 DIAGNOSIS — M5413 Radiculopathy, cervicothoracic region: Secondary | ICD-10-CM | POA: Diagnosis not present

## 2016-12-23 DIAGNOSIS — M25512 Pain in left shoulder: Secondary | ICD-10-CM | POA: Diagnosis not present

## 2016-12-23 DIAGNOSIS — M542 Cervicalgia: Secondary | ICD-10-CM | POA: Diagnosis not present

## 2016-12-24 DIAGNOSIS — M25512 Pain in left shoulder: Secondary | ICD-10-CM | POA: Diagnosis not present

## 2016-12-24 DIAGNOSIS — M5413 Radiculopathy, cervicothoracic region: Secondary | ICD-10-CM | POA: Diagnosis not present

## 2016-12-24 DIAGNOSIS — M542 Cervicalgia: Secondary | ICD-10-CM | POA: Diagnosis not present

## 2016-12-24 DIAGNOSIS — M5412 Radiculopathy, cervical region: Secondary | ICD-10-CM | POA: Diagnosis not present

## 2016-12-27 DIAGNOSIS — M25512 Pain in left shoulder: Secondary | ICD-10-CM | POA: Diagnosis not present

## 2016-12-27 DIAGNOSIS — M5413 Radiculopathy, cervicothoracic region: Secondary | ICD-10-CM | POA: Diagnosis not present

## 2016-12-27 DIAGNOSIS — M5412 Radiculopathy, cervical region: Secondary | ICD-10-CM | POA: Diagnosis not present

## 2016-12-27 DIAGNOSIS — M542 Cervicalgia: Secondary | ICD-10-CM | POA: Diagnosis not present

## 2017-01-10 DIAGNOSIS — M5412 Radiculopathy, cervical region: Secondary | ICD-10-CM | POA: Diagnosis not present

## 2017-01-10 DIAGNOSIS — M25512 Pain in left shoulder: Secondary | ICD-10-CM | POA: Diagnosis not present

## 2017-01-10 DIAGNOSIS — M542 Cervicalgia: Secondary | ICD-10-CM | POA: Diagnosis not present

## 2017-01-10 DIAGNOSIS — M5413 Radiculopathy, cervicothoracic region: Secondary | ICD-10-CM | POA: Diagnosis not present

## 2017-01-11 DIAGNOSIS — M542 Cervicalgia: Secondary | ICD-10-CM | POA: Diagnosis not present

## 2017-01-11 DIAGNOSIS — M25512 Pain in left shoulder: Secondary | ICD-10-CM | POA: Diagnosis not present

## 2017-01-11 DIAGNOSIS — M5413 Radiculopathy, cervicothoracic region: Secondary | ICD-10-CM | POA: Diagnosis not present

## 2017-01-11 DIAGNOSIS — M5412 Radiculopathy, cervical region: Secondary | ICD-10-CM | POA: Diagnosis not present

## 2017-01-12 DIAGNOSIS — M542 Cervicalgia: Secondary | ICD-10-CM | POA: Diagnosis not present

## 2017-01-12 DIAGNOSIS — M5412 Radiculopathy, cervical region: Secondary | ICD-10-CM | POA: Diagnosis not present

## 2017-01-12 DIAGNOSIS — M5413 Radiculopathy, cervicothoracic region: Secondary | ICD-10-CM | POA: Diagnosis not present

## 2017-01-12 DIAGNOSIS — M25512 Pain in left shoulder: Secondary | ICD-10-CM | POA: Diagnosis not present

## 2017-01-14 DIAGNOSIS — M5413 Radiculopathy, cervicothoracic region: Secondary | ICD-10-CM | POA: Diagnosis not present

## 2017-01-14 DIAGNOSIS — M25512 Pain in left shoulder: Secondary | ICD-10-CM | POA: Diagnosis not present

## 2017-01-14 DIAGNOSIS — M542 Cervicalgia: Secondary | ICD-10-CM | POA: Diagnosis not present

## 2017-01-14 DIAGNOSIS — M5412 Radiculopathy, cervical region: Secondary | ICD-10-CM | POA: Diagnosis not present

## 2017-01-17 DIAGNOSIS — M25562 Pain in left knee: Secondary | ICD-10-CM | POA: Diagnosis not present

## 2017-01-17 DIAGNOSIS — M222X2 Patellofemoral disorders, left knee: Secondary | ICD-10-CM | POA: Diagnosis not present

## 2017-02-06 ENCOUNTER — Other Ambulatory Visit: Payer: Self-pay | Admitting: Family Medicine

## 2017-02-15 DIAGNOSIS — L570 Actinic keratosis: Secondary | ICD-10-CM | POA: Diagnosis not present

## 2017-02-15 DIAGNOSIS — L821 Other seborrheic keratosis: Secondary | ICD-10-CM | POA: Diagnosis not present

## 2017-03-01 ENCOUNTER — Other Ambulatory Visit: Payer: Self-pay | Admitting: Family Medicine

## 2017-03-01 DIAGNOSIS — Z96651 Presence of right artificial knee joint: Secondary | ICD-10-CM | POA: Diagnosis not present

## 2017-03-01 DIAGNOSIS — G250 Essential tremor: Secondary | ICD-10-CM

## 2017-04-14 DIAGNOSIS — M9902 Segmental and somatic dysfunction of thoracic region: Secondary | ICD-10-CM | POA: Diagnosis not present

## 2017-04-14 DIAGNOSIS — M546 Pain in thoracic spine: Secondary | ICD-10-CM | POA: Diagnosis not present

## 2017-04-14 DIAGNOSIS — M9908 Segmental and somatic dysfunction of rib cage: Secondary | ICD-10-CM | POA: Diagnosis not present

## 2017-04-14 DIAGNOSIS — M5414 Radiculopathy, thoracic region: Secondary | ICD-10-CM | POA: Diagnosis not present

## 2017-04-15 DIAGNOSIS — M5414 Radiculopathy, thoracic region: Secondary | ICD-10-CM | POA: Diagnosis not present

## 2017-04-15 DIAGNOSIS — M9908 Segmental and somatic dysfunction of rib cage: Secondary | ICD-10-CM | POA: Diagnosis not present

## 2017-04-15 DIAGNOSIS — M9902 Segmental and somatic dysfunction of thoracic region: Secondary | ICD-10-CM | POA: Diagnosis not present

## 2017-04-15 DIAGNOSIS — M546 Pain in thoracic spine: Secondary | ICD-10-CM | POA: Diagnosis not present

## 2017-04-18 DIAGNOSIS — M546 Pain in thoracic spine: Secondary | ICD-10-CM | POA: Diagnosis not present

## 2017-04-18 DIAGNOSIS — M5414 Radiculopathy, thoracic region: Secondary | ICD-10-CM | POA: Diagnosis not present

## 2017-04-18 DIAGNOSIS — M9908 Segmental and somatic dysfunction of rib cage: Secondary | ICD-10-CM | POA: Diagnosis not present

## 2017-04-18 DIAGNOSIS — M9902 Segmental and somatic dysfunction of thoracic region: Secondary | ICD-10-CM | POA: Diagnosis not present

## 2017-06-12 ENCOUNTER — Other Ambulatory Visit: Payer: Self-pay | Admitting: Family Medicine

## 2017-06-12 DIAGNOSIS — G250 Essential tremor: Secondary | ICD-10-CM

## 2017-06-12 NOTE — Telephone Encounter (Signed)
Patient has not been seen in over a year. Needs office visit scheduled before medications can be refilled.

## 2017-06-13 NOTE — Telephone Encounter (Signed)
Left message to call back  

## 2017-06-14 ENCOUNTER — Encounter: Payer: Self-pay | Admitting: Family Medicine

## 2017-06-14 ENCOUNTER — Ambulatory Visit: Payer: BLUE CROSS/BLUE SHIELD | Admitting: Family Medicine

## 2017-06-14 ENCOUNTER — Encounter: Payer: Self-pay | Admitting: *Deleted

## 2017-06-14 VITALS — BP 130/70 | HR 63 | Temp 98.4°F | Resp 16 | Wt 216.0 lb

## 2017-06-14 DIAGNOSIS — G250 Essential tremor: Secondary | ICD-10-CM

## 2017-06-14 DIAGNOSIS — L03115 Cellulitis of right lower limb: Secondary | ICD-10-CM | POA: Diagnosis not present

## 2017-06-14 MED ORDER — DOXYCYCLINE HYCLATE 100 MG PO TABS: 100.0000 mg | ORAL_TABLET | Freq: Two times a day (BID) | ORAL | 0 refills | Status: AC

## 2017-06-14 MED ORDER — PRIMIDONE 50 MG PO TABS: 50.0000 mg | ORAL_TABLET | Freq: Every evening | ORAL | 6 refills | Status: DC | PRN

## 2017-06-14 MED ORDER — BISOPROLOL FUMARATE 10 MG PO TABS: 10.0000 mg | ORAL_TABLET | Freq: Every day | ORAL | 11 refills | Status: DC

## 2017-06-14 NOTE — Progress Notes (Signed)
Patient: Rodney Rojas Male    DOB: 05-09-1961   56 y.o.   MRN: [id number] Visit Date: 06/14/2017  Today's Provider: Lelon Huh, MD   Chief Complaint  Patient presents with  . Follow-up   Subjective:    HPI Presents today to follow up for essential tremor. Is taking mysoline at night and Zebeta in the morning. Is tolerating medications well and feels they remain effect. He continue to follow up regularly with Dr. Eliberto Ivory for BPH   He also has a sore on anterior aspect of his right thigh for the last several days which has drained off and on and is very tender to touch.   Allergies  Allergen Reactions  . Bee Venom Anaphylaxis and Itching    Bee stings     Current Outpatient Medications:  .  bisoprolol (ZEBETA) 10 MG tablet, take 1 tablet by mouth once daily, Disp: 30 tablet, Rfl: 5 .  EPIPEN 2-PAK 0.3 MG/0.3ML SOAJ injection, use as directed by prescriber, Disp: 2 Device, Rfl: 3 .  primidone (MYSOLINE) 50 MG tablet, TAKE 1 TO 2 TABLETS BY MOUTH AT BEDTIME AS NEEDED, Disp: 60 tablet, Rfl: 1 .  tamsulosin (FLOMAX) 0.4 MG CAPS capsule, take 1 capsule by mouth once daily 1/2 HOUR after A MEAL, Disp: , Rfl: 0  Review of Systems  Constitutional: Negative for appetite change, chills and fever.  Respiratory: Negative for chest tightness, shortness of breath and wheezing.   Cardiovascular: Negative for chest pain and palpitations.  Gastrointestinal: Negative for abdominal pain, nausea and vomiting.    Social History   Tobacco Use  . Smoking status: Never Smoker  . Smokeless tobacco: Never Used  Substance Use Topics  . Alcohol use: Yes    Alcohol/week: 0.6 oz    Types: 1 Cans of beer per week    Comment: occasional use   Objective:   BP 130/70 (BP Location: Right Arm, Patient Position: Sitting, Cuff Size: Large)   Pulse 63   Temp 98.4 F (36.9 C) (Oral)   Resp 16   Wt 216 lb (98 kg)   SpO2 94%   BMI 31.90 kg/m  Vitals:   06/14/17 1351  BP: 130/70  Pulse:  63  Resp: 16  Temp: 98.4 F (36.9 C)  TempSrc: Oral  SpO2: 94%  Weight: 216 lb (98 kg)     Physical Exam   General Appearance:    Alert, cooperative, no distress  Eyes:    PERRL, conjunctiva/corneas clear, EOM's intact       Lungs:     Clear to auscultation bilaterally, respirations unlabored  Heart:    Regular rate and rhythm  Neurologic:   Awake, alert, oriented x 3. No apparent focal neurological           defect.   Skin:   Quarter sized red irritated area as per HPI, no discharge.Well circumscribed, no surrounding erythema.        Assessment & Plan:     1. Benign essential tremor Well controlled.  Continue current medications.   - primidone (MYSOLINE) 50 MG tablet; Take 1-2 tablets (50-100 mg total) at bedtime as needed by mouth.  Dispense: 60 tablet; Refill: 6 - bisoprolol (ZEBETA) 10 MG tablet; Take 1 tablet (10 mg total) daily by mouth.  Dispense: 30 tablet; Refill: 11  2. Cellulitis of right lower extremity  - doxycycline (VIBRA-TABS) 100 MG tablet; Take 1 tablet (100 mg total) 2 (two) times daily for 10 days  by mouth.  Dispense: 20 tablet; Refill: 0  Call if symptoms change or if not rapidly improving.          Lelon Huh, MD  [facility]

## 2017-06-27 NOTE — Telephone Encounter (Signed)
Left message to call back  

## 2017-07-05 DIAGNOSIS — Z79899 Other long term (current) drug therapy: Secondary | ICD-10-CM | POA: Diagnosis not present

## 2017-07-05 DIAGNOSIS — M4010 Other secondary kyphosis, site unspecified: Secondary | ICD-10-CM | POA: Diagnosis not present

## 2017-07-05 DIAGNOSIS — E291 Testicular hypofunction: Secondary | ICD-10-CM | POA: Diagnosis not present

## 2017-07-15 DIAGNOSIS — E291 Testicular hypofunction: Secondary | ICD-10-CM | POA: Diagnosis not present

## 2017-07-15 DIAGNOSIS — N5201 Erectile dysfunction due to arterial insufficiency: Secondary | ICD-10-CM | POA: Diagnosis not present

## 2017-07-15 DIAGNOSIS — Z79899 Other long term (current) drug therapy: Secondary | ICD-10-CM | POA: Diagnosis not present

## 2017-07-15 DIAGNOSIS — N401 Enlarged prostate with lower urinary tract symptoms: Secondary | ICD-10-CM | POA: Diagnosis not present

## 2017-07-21 DIAGNOSIS — M1712 Unilateral primary osteoarthritis, left knee: Secondary | ICD-10-CM | POA: Insufficient documentation

## 2017-07-21 DIAGNOSIS — M25562 Pain in left knee: Secondary | ICD-10-CM | POA: Diagnosis not present

## 2017-09-09 DIAGNOSIS — M1712 Unilateral primary osteoarthritis, left knee: Secondary | ICD-10-CM | POA: Diagnosis not present

## 2017-10-03 DIAGNOSIS — H2512 Age-related nuclear cataract, left eye: Secondary | ICD-10-CM | POA: Diagnosis not present

## 2017-10-04 ENCOUNTER — Encounter: Payer: Self-pay | Admitting: *Deleted

## 2017-10-11 ENCOUNTER — Encounter
Admission: RE | Disposition: A | Discharge status: Home / Self Care | Payer: Self-pay | Source: Ambulatory Visit | Attending: Ophthalmology

## 2017-10-11 ENCOUNTER — Ambulatory Visit: Payer: BLUE CROSS/BLUE SHIELD | Admitting: Certified Registered Nurse Anesthetist

## 2017-10-11 ENCOUNTER — Ambulatory Visit
Admission: RE | Admit: 2017-10-11 | Discharge: 2017-10-11 | Disposition: A | Discharge status: Home / Self Care | Payer: BLUE CROSS/BLUE SHIELD | Source: Ambulatory Visit | Attending: Ophthalmology | Admitting: Ophthalmology

## 2017-10-11 ENCOUNTER — Encounter: Payer: Self-pay | Admitting: Certified Registered Nurse Anesthetist

## 2017-10-11 DIAGNOSIS — R251 Tremor, unspecified: Secondary | ICD-10-CM | POA: Insufficient documentation

## 2017-10-11 DIAGNOSIS — Z79899 Other long term (current) drug therapy: Secondary | ICD-10-CM | POA: Insufficient documentation

## 2017-10-11 DIAGNOSIS — H2512 Age-related nuclear cataract, left eye: Secondary | ICD-10-CM | POA: Diagnosis not present

## 2017-10-11 DIAGNOSIS — K219 Gastro-esophageal reflux disease without esophagitis: Secondary | ICD-10-CM | POA: Insufficient documentation

## 2017-10-11 DIAGNOSIS — Z96651 Presence of right artificial knee joint: Secondary | ICD-10-CM | POA: Insufficient documentation

## 2017-10-11 DIAGNOSIS — N400 Enlarged prostate without lower urinary tract symptoms: Secondary | ICD-10-CM | POA: Diagnosis not present

## 2017-10-11 HISTORY — PX: CATARACT EXTRACTION W/PHACO: SHX586

## 2017-10-11 HISTORY — DX: Gastro-esophageal reflux disease without esophagitis: K21.9

## 2017-10-11 SURGERY — PHACOEMULSIFICATION, CATARACT, WITH IOL INSERTION
Anesthesia: Monitor Anesthesia Care | Site: Eye | Laterality: Left | Wound class: Clean

## 2017-10-11 MED ORDER — SODIUM CHLORIDE 0.9 % IV SOLN
INTRAVENOUS | Status: DC
  Administered 2017-10-11: 11:00:00 via INTRAVENOUS

## 2017-10-11 MED ORDER — MIDAZOLAM HCL 2 MG/2ML IJ SOLN
INTRAMUSCULAR | Status: AC
  Filled 2017-10-11: qty 2

## 2017-10-11 MED ORDER — EPINEPHRINE PF 1 MG/ML IJ SOLN
INTRAMUSCULAR | Status: DC | PRN
  Administered 2017-10-11: 12:00:00 via OPHTHALMIC

## 2017-10-11 MED ORDER — POVIDONE-IODINE 5 % OP SOLN
OPHTHALMIC | Status: DC | PRN
Start: 2017-10-11 — End: 2017-10-11
  Administered 2017-10-11: 1 via OPHTHALMIC

## 2017-10-11 MED ORDER — ARMC OPHTHALMIC DILATING DROPS
OPHTHALMIC | Status: AC
  Administered 2017-10-11: 1 via OPHTHALMIC
  Filled 2017-10-11: qty 0.4

## 2017-10-11 MED ORDER — CARBACHOL 0.01 % IO SOLN
INTRAOCULAR | Status: DC | PRN
  Administered 2017-10-11: 0.5 mL via INTRAOCULAR

## 2017-10-11 MED ORDER — MOXIFLOXACIN HCL 0.5 % OP SOLN
OPHTHALMIC | Status: DC | PRN
  Administered 2017-10-11: 0.2 mL via OPHTHALMIC

## 2017-10-11 MED ORDER — LIDOCAINE HCL (PF) 4 % IJ SOLN
INTRAOCULAR | Status: DC | PRN
  Administered 2017-10-11: 4 mL via OPHTHALMIC

## 2017-10-11 MED ORDER — MOXIFLOXACIN HCL 0.5 % OP SOLN
OPHTHALMIC | Status: AC
  Filled 2017-10-11: qty 3

## 2017-10-11 MED ORDER — NA CHONDROIT SULF-NA HYALURON 40-17 MG/ML IO SOLN
INTRAOCULAR | Status: DC | PRN
  Administered 2017-10-11: 1 mL via INTRAOCULAR

## 2017-10-11 MED ORDER — ARMC OPHTHALMIC DILATING DROPS
1.0000 "application " | OPHTHALMIC | Status: AC
  Administered 2017-10-11 (×3): 1 via OPHTHALMIC

## 2017-10-11 MED ORDER — MIDAZOLAM HCL 2 MG/2ML IJ SOLN
INTRAMUSCULAR | Status: DC | PRN
  Administered 2017-10-11: 0.5 mg via INTRAVENOUS
  Administered 2017-10-11: 1 mg via INTRAVENOUS
  Administered 2017-10-11: 0.5 mg via INTRAVENOUS

## 2017-10-11 MED ORDER — MOXIFLOXACIN HCL 0.5 % OP SOLN: 1.0000 [drp] | OPHTHALMIC | Status: DC | PRN

## 2017-10-11 SURGICAL SUPPLY — 16 items

## 2017-10-11 NOTE — H&P (Signed)
All labs reviewed. Abnormal studies sent to patients PCP when indicated.  Previous H&P reviewed, patient examined, there are NO CHANGES.  Rodney Rojas Porfilio3/5/201911:26 AM

## 2017-10-11 NOTE — Discharge Instructions (Signed)
FOLLOW DR. PORFILIO'S POSTOP DISCHARGE INSTRUCTION SHEET AS REVIEWED. Eye Surgery Discharge Instructions  Expect mild scratchy sensation or mild soreness. DO NOT RUB YOUR EYE!  The day of surgery:  Minimal physical activity, but bed rest is not required  No reading, computer work, or close hand work  No bending, lifting, or straining.  May watch TV  For 24 hours:  No driving, legal decisions, or alcoholic beverages  Safety precautions  Eat anything you prefer: It is better to start with liquids, then soup then solid foods.  _____ Eye patch should be worn until postoperative exam tomorrow.  ____ Solar shield eyeglasses should be worn for comfort in the sunlight/patch while sleeping  Resume all regular medications including aspirin or Coumadin if these were discontinued prior to surgery. You may shower, bathe, shave, or wash your hair. Tylenol may be taken for mild discomfort.  Call your doctor if you experience significant pain, nausea, or vomiting, fever > 101 or other signs of infection. [phone] or [phone] Specific instructions:  Follow-up Information    Birder Robson, MD Follow up.   Specialty:  Ophthalmology Why:  10/12/17 @ 2:25 pm Contact information: [address] [place] [address] [phone]

## 2017-10-11 NOTE — Transfer of Care (Signed)
Immediate Anesthesia Transfer of Care Note  Patient: Rodney Rojas  Procedure(s) Performed: CATARACT EXTRACTION PHACO AND INTRAOCULAR LENS PLACEMENT (IOC) (Left Eye)  Patient Location: Short Stay  Anesthesia Type:MAC  Level of Consciousness: awake, alert , oriented and patient cooperative  Airway & Oxygen Therapy: Patient Spontanous Breathing  Post-op Assessment: Report given to RN and Post -op Vital signs reviewed and stable  Post vital signs: Reviewed and stable  Last Vitals:  Vitals:   10/11/17 1023  BP: (!) 162/88  Pulse: (!) 47  Resp: 16  Temp: (!) 36.2 C  SpO2: 99%    Last Pain:  Vitals:   10/11/17 1023  TempSrc: Tympanic         Complications: No apparent anesthesia complications

## 2017-10-11 NOTE — Anesthesia Preprocedure Evaluation (Signed)
Anesthesia Evaluation  Patient identified by MRN, date of birth, ID band Patient awake    Reviewed: Allergy & Precautions, NPO status , Patient's Chart, lab work & pertinent test results  History of Anesthesia Complications (+) PROLONGED EMERGENCE and history of anesthetic complications  Airway Mallampati: II       Dental  (+) Teeth Intact   Pulmonary neg pulmonary ROS,           Cardiovascular Exercise Tolerance: Good      Neuro/Psych Benign tremors negative neurological ROS  negative psych ROS   GI/Hepatic Neg liver ROS, GERD  Medicated,  Endo/Other  negative endocrine ROS  Renal/GU negative Renal ROS  negative genitourinary   Musculoskeletal negative musculoskeletal ROS (+)   Abdominal Normal abdominal exam  (+)   Peds negative pediatric ROS (+)  Hematology negative hematology ROS (+)   Anesthesia Other Findings Past Medical History: No date: Arthritis No date: Complication of anesthesia     Comment:  slow to wake up after colonoscopy No date: GERD (gastroesophageal reflux disease) No date: History of chicken pox No date: Prostate enlargement No date: Tremors of nervous system   Reproductive/Obstetrics                             Anesthesia Physical  Anesthesia Plan  ASA: II  Anesthesia Plan: MAC   Post-op Pain Management:    Induction: Intravenous  PONV Risk Score and Plan:   Airway Management Planned: Natural Airway and Nasal Cannula  Additional Equipment:   Intra-op Plan:   Post-operative Plan:   Informed Consent: I have reviewed the patients History and Physical, chart, labs and discussed the procedure including the risks, benefits and alternatives for the proposed anesthesia with the patient or authorized representative who has indicated his/her understanding and acceptance.     Plan Discussed with: CRNA  Anesthesia Plan Comments:          Anesthesia Quick Evaluation

## 2017-10-11 NOTE — Anesthesia Post-op Follow-up Note (Signed)
Anesthesia QCDR form completed.        

## 2017-10-11 NOTE — Anesthesia Postprocedure Evaluation (Signed)
Anesthesia Post Note  Patient: Rodney Rojas  Procedure(s) Performed: CATARACT EXTRACTION PHACO AND INTRAOCULAR LENS PLACEMENT (IOC) (Left Eye)  Patient location during evaluation: PACU Anesthesia Type: MAC Level of consciousness: awake and alert Pain management: pain level controlled Vital Signs Assessment: post-procedure vital signs reviewed and stable Respiratory status: spontaneous breathing, nonlabored ventilation, respiratory function stable and patient connected to nasal cannula oxygen Cardiovascular status: stable and blood pressure returned to baseline Postop Assessment: no apparent nausea or vomiting Anesthetic complications: no     Last Vitals:  Vitals:   10/11/17 1023 10/11/17 1154  BP: (!) 162/88 (!) 161/81  Pulse: (!) 47 (!) 50  Resp: 16 16  Temp: (!) 36.2 C 37.1 C  SpO2: 99% 99%    Last Pain:  Vitals:   10/11/17 1154  TempSrc: Oral                 Martha Clan

## 2017-10-11 NOTE — Op Note (Signed)
PREOPERATIVE DIAGNOSIS:  Nuclear sclerotic cataract of the left eye.   POSTOPERATIVE DIAGNOSIS:  Nuclear sclerotic cataract of the left eye.   OPERATIVE PROCEDURE: Procedure(s): CATARACT EXTRACTION PHACO AND INTRAOCULAR LENS PLACEMENT (IOC)   SURGEON:  Birder Robson, MD.   ANESTHESIA:  Anesthesiologist: Martha Clan, MD CRNA: Eben Burow, CRNA  1.      Managed anesthesia care. 2.     0.61ml of Shugarcaine was instilled following the paracentesis   COMPLICATIONS:  None.   TECHNIQUE:   Stop and chop   DESCRIPTION OF PROCEDURE:  The patient was examined and consented in the preoperative holding area where the aforementioned topical anesthesia was applied to the left eye and then brought back to the Operating Room where the left eye was prepped and draped in the usual sterile ophthalmic fashion and a lid speculum was placed. A paracentesis was created with the side port blade and the anterior chamber was filled with viscoelastic. A near clear corneal incision was performed with the steel keratome. A continuous curvilinear capsulorrhexis was performed with a cystotome followed by the capsulorrhexis forceps. Hydrodissection and hydrodelineation were carried out with BSS on a blunt cannula. The lens was removed in a stop and chop  technique and the remaining cortical material was removed with the irrigation-aspiration handpiece. The capsular bag was inflated with viscoelastic and the Technis [id number] lens was placed in the capsular bag without complication. The remaining viscoelastic was removed from the eye with the irrigation-aspiration handpiece. The wounds were hydrated. The anterior chamber was flushed with Miostat and the eye was inflated to physiologic pressure. 0.52ml Vigamox was placed in the anterior chamber. The wounds were found to be water tight. The eye was dressed with Vigamox. The patient was given protective glasses to wear throughout the day and a shield with which to sleep  tonight. The patient was also given drops with which to begin a drop regimen today and will follow-up with me in one day. Implant Name Type Inv. Item Serial No. Manufacturer Lot No. LRB No. Used  LENS IOL DIOP 21.5 - [id number] Intraocular Lens LENS IOL DIOP 21.5 [id number] AMO  Left 1    Procedure(s) with comments: CATARACT EXTRACTION PHACO AND INTRAOCULAR LENS PLACEMENT (IOC) (Left) - [place] 00:23.0 AP% 13.1 CDE 3.02 Fluid Pack Lot # [id number]  Electronically signed: Birder Robson 10/11/2017 11:53 AM

## 2017-12-29 DIAGNOSIS — M25562 Pain in left knee: Secondary | ICD-10-CM | POA: Diagnosis not present

## 2017-12-29 DIAGNOSIS — M1712 Unilateral primary osteoarthritis, left knee: Secondary | ICD-10-CM | POA: Diagnosis not present

## 2018-01-04 DIAGNOSIS — Z5181 Encounter for therapeutic drug level monitoring: Secondary | ICD-10-CM | POA: Diagnosis not present

## 2018-01-04 DIAGNOSIS — B353 Tinea pedis: Secondary | ICD-10-CM | POA: Diagnosis not present

## 2018-01-04 DIAGNOSIS — D2361 Other benign neoplasm of skin of right upper limb, including shoulder: Secondary | ICD-10-CM | POA: Diagnosis not present

## 2018-01-04 DIAGNOSIS — B351 Tinea unguium: Secondary | ICD-10-CM | POA: Diagnosis not present

## 2018-01-04 DIAGNOSIS — L609 Nail disorder, unspecified: Secondary | ICD-10-CM | POA: Diagnosis not present

## 2018-02-07 DIAGNOSIS — M1712 Unilateral primary osteoarthritis, left knee: Secondary | ICD-10-CM | POA: Diagnosis not present

## 2018-02-22 DIAGNOSIS — E291 Testicular hypofunction: Secondary | ICD-10-CM | POA: Diagnosis not present

## 2018-02-22 DIAGNOSIS — Z125 Encounter for screening for malignant neoplasm of prostate: Secondary | ICD-10-CM | POA: Diagnosis not present

## 2018-02-22 DIAGNOSIS — Z79899 Other long term (current) drug therapy: Secondary | ICD-10-CM | POA: Diagnosis not present

## 2018-02-22 DIAGNOSIS — R350 Frequency of micturition: Secondary | ICD-10-CM | POA: Diagnosis not present

## 2018-02-22 DIAGNOSIS — N401 Enlarged prostate with lower urinary tract symptoms: Secondary | ICD-10-CM | POA: Diagnosis not present

## 2018-02-22 DIAGNOSIS — R3914 Feeling of incomplete bladder emptying: Secondary | ICD-10-CM | POA: Diagnosis not present

## 2018-03-02 DIAGNOSIS — M1712 Unilateral primary osteoarthritis, left knee: Secondary | ICD-10-CM | POA: Diagnosis not present

## 2018-03-04 ENCOUNTER — Other Ambulatory Visit: Payer: Self-pay | Admitting: Family Medicine

## 2018-03-04 DIAGNOSIS — G250 Essential tremor: Secondary | ICD-10-CM

## 2018-03-06 DIAGNOSIS — E291 Testicular hypofunction: Secondary | ICD-10-CM | POA: Diagnosis not present

## 2018-03-06 DIAGNOSIS — N5201 Erectile dysfunction due to arterial insufficiency: Secondary | ICD-10-CM | POA: Diagnosis not present

## 2018-03-06 DIAGNOSIS — N401 Enlarged prostate with lower urinary tract symptoms: Secondary | ICD-10-CM | POA: Diagnosis not present

## 2018-03-09 DIAGNOSIS — M1712 Unilateral primary osteoarthritis, left knee: Secondary | ICD-10-CM | POA: Diagnosis not present

## 2018-03-16 DIAGNOSIS — M7541 Impingement syndrome of right shoulder: Secondary | ICD-10-CM | POA: Diagnosis not present

## 2018-03-16 DIAGNOSIS — M1712 Unilateral primary osteoarthritis, left knee: Secondary | ICD-10-CM | POA: Diagnosis not present

## 2018-03-16 DIAGNOSIS — G8929 Other chronic pain: Secondary | ICD-10-CM | POA: Diagnosis not present

## 2018-03-16 DIAGNOSIS — M25511 Pain in right shoulder: Secondary | ICD-10-CM | POA: Diagnosis not present

## 2018-04-20 DIAGNOSIS — M1712 Unilateral primary osteoarthritis, left knee: Secondary | ICD-10-CM | POA: Diagnosis not present

## 2018-05-18 DIAGNOSIS — H2511 Age-related nuclear cataract, right eye: Secondary | ICD-10-CM | POA: Diagnosis not present

## 2018-05-21 NOTE — Discharge Instructions (Signed)
°  Instructions after Total Knee Replacement ° ° Marielena Harvell P. Brynn Reznik, Jr., M.D.    ° Dept. of Orthopaedics & Sports Medicine ° [facility] ° [address] ° [place], [place]  [address] ° Phone: [phone]   Fax: [phone] ° °  °DIET: °• Drink plenty of non-alcoholic fluids. °• Resume your normal diet. Include foods high in fiber. ° °ACTIVITY:  °• You may use crutches or a walker with weight-bearing as tolerated, unless instructed otherwise. °• You may be weaned off of the walker or crutches by your Physical Therapist.  °• Do NOT place pillows under the knee. Anything placed under the knee could limit your ability to straighten the knee.   °• Continue doing gentle exercises. Exercising will reduce the pain and swelling, increase motion, and prevent muscle weakness.   °• Please continue to use the TED compression stockings for 6 weeks. You may remove the stockings at night, but should reapply them in the morning. °• Do not drive or operate any equipment until instructed. ° °WOUND CARE:  °• Continue to use the PolarCare or ice packs periodically to reduce pain and swelling. °• You may bathe or shower after the staples are removed at the first office visit following surgery. ° °MEDICATIONS: °• You may resume your regular medications. °• Please take the pain medication as prescribed on the medication. °• Do not take pain medication on an empty stomach. °• You have been given a prescription for a blood thinner (Lovenox or Coumadin). Please take the medication as instructed. (NOTE: After completing a 2 week course of Lovenox, take one Enteric-coated aspirin once a day. This along with elevation will help reduce the possibility of phlebitis in your operated leg.) °• Do not drive or drink alcoholic beverages when taking pain medications. ° °CALL THE OFFICE FOR: °• Temperature above 101 degrees °• Excessive bleeding or drainage on the dressing. °• Excessive swelling, coldness, or paleness of the toes. °• Persistent  nausea and vomiting. ° °FOLLOW-UP:  °• You should have an appointment to return to the office in 10-14 days after surgery. °• Arrangements have been made for continuation of Physical Therapy (either home therapy or outpatient therapy). °  °

## 2018-05-25 ENCOUNTER — Encounter
Admission: RE | Admit: 2018-05-25 | Discharge: 2018-05-25 | Disposition: A | Discharge status: Home / Self Care | Payer: BLUE CROSS/BLUE SHIELD | Source: Ambulatory Visit | Attending: Orthopedic Surgery | Admitting: Orthopedic Surgery

## 2018-05-25 ENCOUNTER — Other Ambulatory Visit: Payer: Self-pay

## 2018-05-25 DIAGNOSIS — I10 Essential (primary) hypertension: Secondary | ICD-10-CM | POA: Diagnosis not present

## 2018-05-25 DIAGNOSIS — Z01818 Encounter for other preprocedural examination: Secondary | ICD-10-CM | POA: Diagnosis not present

## 2018-05-25 HISTORY — DX: Personal history of urinary calculi: Z87.442

## 2018-05-25 HISTORY — DX: Headache, unspecified: R51.9

## 2018-05-25 HISTORY — DX: Headache: R51

## 2018-05-25 LAB — CBC
HEMATOCRIT: 49.3 % (ref 39.0–52.0)
Hemoglobin: 16.1 g/dL (ref 13.0–17.0)
MCH: 30.3 pg (ref 26.0–34.0)
MCHC: 32.7 g/dL (ref 30.0–36.0)
MCV: 92.8 fL (ref 80.0–100.0)
Platelets: 128 10*3/uL — ABNORMAL LOW (ref 150–400)
RBC: 5.31 MIL/uL (ref 4.22–5.81)
RDW: 12.2 % (ref 11.5–15.5)
WBC: 7.3 10*3/uL (ref 4.0–10.5)
nRBC: 0 % (ref 0.0–0.2)

## 2018-05-25 LAB — URINALYSIS, ROUTINE W REFLEX MICROSCOPIC
BILIRUBIN URINE: NEGATIVE
Glucose, UA: NEGATIVE mg/dL
Hgb urine dipstick: NEGATIVE
Ketones, ur: NEGATIVE mg/dL
Leukocytes, UA: NEGATIVE
NITRITE: NEGATIVE
PH: 7 (ref 5.0–8.0)
Protein, ur: NEGATIVE mg/dL
SPECIFIC GRAVITY, URINE: 1.012 (ref 1.005–1.030)

## 2018-05-25 LAB — COMPREHENSIVE METABOLIC PANEL
ALT: 23 U/L (ref 0–44)
ANION GAP: 10 (ref 5–15)
AST: 20 U/L (ref 15–41)
Albumin: 4.7 g/dL (ref 3.5–5.0)
Alkaline Phosphatase: 40 U/L (ref 38–126)
BILIRUBIN TOTAL: 0.9 mg/dL (ref 0.3–1.2)
BUN: 13 mg/dL (ref 6–20)
CO2: 27 mmol/L (ref 22–32)
Calcium: 8.9 mg/dL (ref 8.9–10.3)
Chloride: 101 mmol/L (ref 98–111)
Creatinine, Ser: 0.88 mg/dL (ref 0.61–1.24)
Glucose, Bld: 89 mg/dL (ref 70–99)
POTASSIUM: 4.1 mmol/L (ref 3.5–5.1)
Sodium: 138 mmol/L (ref 135–145)
TOTAL PROTEIN: 7.6 g/dL (ref 6.5–8.1)

## 2018-05-25 LAB — PROTIME-INR
INR: 1.02
PROTHROMBIN TIME: 13.3 s (ref 11.4–15.2)

## 2018-05-25 LAB — TYPE AND SCREEN
ABO/RH(D): A POS
ANTIBODY SCREEN: NEGATIVE

## 2018-05-25 LAB — C-REACTIVE PROTEIN: CRP: <0.8 mg/dL (ref <1.0)

## 2018-05-25 LAB — SURGICAL PCR SCREEN
MRSA, PCR: NEGATIVE
Staphylococcus aureus: NEGATIVE

## 2018-05-25 LAB — APTT: aPTT: 29 seconds (ref 24–36)

## 2018-05-25 LAB — SEDIMENTATION RATE: SED RATE: 1 mm/h (ref 0–20)

## 2018-05-25 NOTE — Patient Instructions (Addendum)
Your procedure is scheduled on:  Monday 06/05/18  Report to [facility]. To find out your arrival time please call [phone] between 1PM - 3PM on Friday 06/02/18.  Remember: Instructions that are not followed completely may result in serious medical risk, up to and including death, or upon the discretion of your surgeon and anesthesiologist your surgery may need to be rescheduled.     _X__ 1. Do not eat food after midnight the night before your procedure.                 No gum chewing or hard candies. You may drink clear liquids up to 2 hours                 before you are scheduled to arrive for your surgery- DO NOT drink clear                 liquids within 2 hours of the start of your surgery.                 Clear Liquids include:  water, apple juice without pulp, clear carbohydrate                 drink such as Clearfast or Gatorade, Black Coffee or Tea (Do not add                 anything to coffee or tea).  __X__2.  On the morning of surgery brush your teeth with toothpaste and water, you may rinse your mouth with mouthwash if you wish.  Do not swallow any toothpaste or mouthwash.     _X__ 3.  No Alcohol for 24 hours before or after surgery.   _X__ 4.  Do Not Smoke or use e-cigarettes For 24 Hours Prior to Your Surgery.                 Do not use any chewable tobacco products for at least 6 hours prior to                 surgery.  ____  5.  Bring all medications with you on the day of surgery if instructed.   __X__  6.  Notify your doctor if there is any change in your medical condition      (cold, fever, infections).     Do not wear jewelry, make-up, hairpins, clips or nail polish. Do not wear lotions, powders, or perfumes.  Do not shave 48 hours prior to surgery. Men may shave face and neck. Do not bring valuables to the hospital.    [facility] is not responsible for any belongings or valuables.  Contacts,  dentures/partials or body piercings may not be worn into surgery. Bring a case for your contacts, glasses or hearing aids, a denture cup will be supplied. Leave your suitcase in the car. After surgery it may be brought to your room. For patients admitted to the hospital, discharge time is determined by your treatment team.   Patients discharged the day of surgery will not be allowed to drive home.   Please read over the following fact sheets that you were given:   MRSA Information  __X__ Take these medicines the morning of surgery with A SIP OF WATER:     1. bisoprolol (ZEBETA) 10 MG tablet  2. tamsulosin (FLOMAX) 0.4 MG CAPS capsule     __X__ Use CHG Soap as directed  __X__ Stop Anti-inflammatories 7 days before surgery such as Advil, Ibuprofen, Motrin, BC or Goodies Powder, Naprosyn, Naproxen, Aleve, Aspirin, Meloxicam. May take Tylenol if needed for pain or discomfort.    __X__ Stop all herbal supplements on 05/29/18.       Tips for after surgery:  Have stool softeners at home to take while taking pain medications.

## 2018-05-26 LAB — URINE CULTURE
Culture: NO GROWTH
Special Requests: NORMAL

## 2018-05-27 ENCOUNTER — Other Ambulatory Visit: Payer: Self-pay | Admitting: Family Medicine

## 2018-05-27 DIAGNOSIS — G250 Essential tremor: Secondary | ICD-10-CM

## 2018-06-05 ENCOUNTER — Ambulatory Visit: Payer: BLUE CROSS/BLUE SHIELD | Admitting: Anesthesiology

## 2018-06-05 ENCOUNTER — Other Ambulatory Visit: Payer: Self-pay | Admitting: Diagnostic Radiology

## 2018-06-05 ENCOUNTER — Inpatient Hospital Stay
Admission: RE | Admit: 2018-06-05 | Discharge: 2018-06-07 | DRG: 470 | Disposition: A | Discharge status: Home Health | Payer: BLUE CROSS/BLUE SHIELD | Attending: Orthopedic Surgery | Admitting: Orthopedic Surgery

## 2018-06-05 ENCOUNTER — Encounter: Payer: Self-pay | Admitting: Orthopedic Surgery

## 2018-06-05 ENCOUNTER — Encounter
Admission: RE | Disposition: A | Discharge status: Home Health | Payer: Self-pay | Source: Home / Self Care | Attending: Orthopedic Surgery

## 2018-06-05 ENCOUNTER — Other Ambulatory Visit: Payer: Self-pay

## 2018-06-05 ENCOUNTER — Inpatient Hospital Stay: Payer: BLUE CROSS/BLUE SHIELD

## 2018-06-05 DIAGNOSIS — M1712 Unilateral primary osteoarthritis, left knee: Secondary | ICD-10-CM | POA: Diagnosis not present

## 2018-06-05 DIAGNOSIS — Z471 Aftercare following joint replacement surgery: Secondary | ICD-10-CM | POA: Diagnosis not present

## 2018-06-05 DIAGNOSIS — K219 Gastro-esophageal reflux disease without esophagitis: Secondary | ICD-10-CM | POA: Diagnosis not present

## 2018-06-05 DIAGNOSIS — Z96659 Presence of unspecified artificial knee joint: Secondary | ICD-10-CM

## 2018-06-05 DIAGNOSIS — Z8249 Family history of ischemic heart disease and other diseases of the circulatory system: Secondary | ICD-10-CM

## 2018-06-05 DIAGNOSIS — Z23 Encounter for immunization: Secondary | ICD-10-CM

## 2018-06-05 DIAGNOSIS — Z96652 Presence of left artificial knee joint: Secondary | ICD-10-CM | POA: Diagnosis not present

## 2018-06-05 DIAGNOSIS — Z833 Family history of diabetes mellitus: Secondary | ICD-10-CM | POA: Diagnosis not present

## 2018-06-05 HISTORY — PX: KNEE ARTHROPLASTY: SHX992

## 2018-06-05 SURGERY — ARTHROPLASTY, KNEE, TOTAL, USING IMAGELESS COMPUTER-ASSISTED NAVIGATION
Anesthesia: Spinal | Laterality: Left

## 2018-06-05 MED ORDER — TAMSULOSIN HCL 0.4 MG PO CAPS: 0.4000 mg | ORAL_CAPSULE | Freq: Every day | ORAL | Status: DC | PRN

## 2018-06-05 MED ORDER — FAMOTIDINE 20 MG PO TABS
20.0000 mg | ORAL_TABLET | Freq: Once | ORAL | Status: AC
  Administered 2018-06-05: 20 mg via ORAL

## 2018-06-05 MED ORDER — GLYCOPYRROLATE 0.2 MG/ML IJ SOLN
INTRAMUSCULAR | Status: AC
  Filled 2018-06-05: qty 1

## 2018-06-05 MED ORDER — ACETAMINOPHEN 10 MG/ML IV SOLN
INTRAVENOUS | Status: AC
  Filled 2018-06-05: qty 100

## 2018-06-05 MED ORDER — DIPHENHYDRAMINE HCL 12.5 MG/5ML PO ELIX: 12.5000 mg | ORAL_SOLUTION | ORAL | Status: DC | PRN

## 2018-06-05 MED ORDER — ACETAMINOPHEN 10 MG/ML IV SOLN
1000.0000 mg | Freq: Four times a day (QID) | INTRAVENOUS | Status: AC
  Administered 2018-06-05 – 2018-06-06 (×3): 1000 mg via INTRAVENOUS
  Filled 2018-06-05 (×4): qty 100

## 2018-06-05 MED ORDER — INFLUENZA VAC SPLIT QUAD 0.5 ML IM SUSY
0.5000 mL | PREFILLED_SYRINGE | INTRAMUSCULAR | Status: AC
  Administered 2018-06-07: 0.5 mL via INTRAMUSCULAR
  Filled 2018-06-05: qty 0.5

## 2018-06-05 MED ORDER — MIDAZOLAM HCL 5 MG/5ML IJ SOLN
INTRAMUSCULAR | Status: DC | PRN
  Administered 2018-06-05: 2 mg via INTRAVENOUS

## 2018-06-05 MED ORDER — CEFAZOLIN SODIUM-DEXTROSE 2-4 GM/100ML-% IV SOLN: 2.0000 g | INTRAVENOUS | Status: DC

## 2018-06-05 MED ORDER — TETRACAINE HCL 1 % IJ SOLN
INTRAMUSCULAR | Status: DC | PRN
  Administered 2018-06-05: 4 mg via INTRASPINAL

## 2018-06-05 MED ORDER — SODIUM CHLORIDE 0.9 % IV SOLN
INTRAVENOUS | Status: DC | PRN
  Administered 2018-06-05: 30 ug/min via INTRAVENOUS

## 2018-06-05 MED ORDER — LACTATED RINGERS IV SOLN
INTRAVENOUS | Status: DC
  Administered 2018-06-05 (×2): via INTRAVENOUS

## 2018-06-05 MED ORDER — METOCLOPRAMIDE HCL 5 MG/ML IJ SOLN: 5.0000 mg | Freq: Three times a day (TID) | INTRAMUSCULAR | Status: DC | PRN

## 2018-06-05 MED ORDER — BUPIVACAINE HCL (PF) 0.5 % IJ SOLN
INTRAMUSCULAR | Status: AC
  Filled 2018-06-05: qty 10

## 2018-06-05 MED ORDER — ENOXAPARIN SODIUM 30 MG/0.3ML ~~LOC~~ SOLN
30.0000 mg | Freq: Two times a day (BID) | SUBCUTANEOUS | Status: DC
  Administered 2018-06-06 – 2018-06-07 (×3): 30 mg via SUBCUTANEOUS
  Filled 2018-06-05 (×3): qty 0.3

## 2018-06-05 MED ORDER — FENTANYL CITRATE (PF) 100 MCG/2ML IJ SOLN: 25.0000 ug | INTRAMUSCULAR | Status: DC | PRN

## 2018-06-05 MED ORDER — PROPOFOL 500 MG/50ML IV EMUL
INTRAVENOUS | Status: DC | PRN
  Administered 2018-06-05: 100 ug/kg/min via INTRAVENOUS

## 2018-06-05 MED ORDER — CHLORHEXIDINE GLUCONATE 4 % EX LIQD: 60.0000 mL | Freq: Once | CUTANEOUS | Status: DC

## 2018-06-05 MED ORDER — FENTANYL CITRATE (PF) 100 MCG/2ML IJ SOLN
INTRAMUSCULAR | Status: AC
  Filled 2018-06-05: qty 2

## 2018-06-05 MED ORDER — HYDROMORPHONE HCL 1 MG/ML IJ SOLN: 0.5000 mg | INTRAMUSCULAR | Status: DC | PRN

## 2018-06-05 MED ORDER — LIDOCAINE HCL (PF) 2 % IJ SOLN
INTRAMUSCULAR | Status: AC
  Filled 2018-06-05: qty 10

## 2018-06-05 MED ORDER — CELECOXIB 200 MG PO CAPS
400.0000 mg | ORAL_CAPSULE | Freq: Once | ORAL | Status: AC
  Administered 2018-06-05: 400 mg via ORAL

## 2018-06-05 MED ORDER — BUPIVACAINE HCL (PF) 0.5 % IJ SOLN
INTRAMUSCULAR | Status: DC | PRN
  Administered 2018-06-05: 2.6 mL

## 2018-06-05 MED ORDER — NEOMYCIN-POLYMYXIN B GU 40-200000 IR SOLN
Status: DC | PRN
  Administered 2018-06-05: 14 mL

## 2018-06-05 MED ORDER — ONDANSETRON HCL 4 MG PO TABS: 4.0000 mg | ORAL_TABLET | Freq: Four times a day (QID) | ORAL | Status: DC | PRN

## 2018-06-05 MED ORDER — FAMOTIDINE 20 MG PO TABS
ORAL_TABLET | ORAL | Status: AC
  Administered 2018-06-05: 20 mg via ORAL
  Filled 2018-06-05: qty 1

## 2018-06-05 MED ORDER — PROPOFOL 500 MG/50ML IV EMUL
INTRAVENOUS | Status: AC
  Filled 2018-06-05: qty 100

## 2018-06-05 MED ORDER — MENTHOL 3 MG MT LOZG
1.0000 | LOZENGE | OROMUCOSAL | Status: DC | PRN
  Filled 2018-06-05: qty 9

## 2018-06-05 MED ORDER — BUPIVACAINE HCL (PF) 0.25 % IJ SOLN
INTRAMUSCULAR | Status: DC | PRN
  Administered 2018-06-05: 60 mL

## 2018-06-05 MED ORDER — TRANEXAMIC ACID-NACL 1000-0.7 MG/100ML-% IV SOLN
1000.0000 mg | INTRAVENOUS | Status: DC
  Filled 2018-06-05: qty 100

## 2018-06-05 MED ORDER — GABAPENTIN 300 MG PO CAPS
300.0000 mg | ORAL_CAPSULE | Freq: Once | ORAL | Status: AC
  Administered 2018-06-05: 300 mg via ORAL

## 2018-06-05 MED ORDER — ONDANSETRON HCL 4 MG/2ML IJ SOLN: 4.0000 mg | Freq: Once | INTRAMUSCULAR | Status: DC | PRN

## 2018-06-05 MED ORDER — FLEET ENEMA 7-19 GM/118ML RE ENEM: 1.0000 | ENEMA | Freq: Once | RECTAL | Status: DC | PRN

## 2018-06-05 MED ORDER — METOCLOPRAMIDE HCL 10 MG PO TABS: 5.0000 mg | ORAL_TABLET | Freq: Three times a day (TID) | ORAL | Status: DC | PRN

## 2018-06-05 MED ORDER — CEFAZOLIN SODIUM-DEXTROSE 2-4 GM/100ML-% IV SOLN
2.0000 g | Freq: Four times a day (QID) | INTRAVENOUS | Status: AC
  Administered 2018-06-05 – 2018-06-06 (×4): 2 g via INTRAVENOUS
  Filled 2018-06-05 (×4): qty 100

## 2018-06-05 MED ORDER — FERROUS SULFATE 325 (65 FE) MG PO TABS
325.0000 mg | ORAL_TABLET | Freq: Two times a day (BID) | ORAL | Status: DC
  Administered 2018-06-06 – 2018-06-07 (×2): 325 mg via ORAL
  Filled 2018-06-05 (×2): qty 1

## 2018-06-05 MED ORDER — CELECOXIB 200 MG PO CAPS
200.0000 mg | ORAL_CAPSULE | Freq: Two times a day (BID) | ORAL | Status: DC
  Administered 2018-06-05 – 2018-06-07 (×4): 200 mg via ORAL
  Filled 2018-06-05 (×4): qty 1

## 2018-06-05 MED ORDER — BISACODYL 10 MG RE SUPP: 10.0000 mg | Freq: Every day | RECTAL | Status: DC | PRN

## 2018-06-05 MED ORDER — ALUM & MAG HYDROXIDE-SIMETH 200-200-20 MG/5ML PO SUSP: 30.0000 mL | ORAL | Status: DC | PRN

## 2018-06-05 MED ORDER — PANTOPRAZOLE SODIUM 40 MG PO TBEC
40.0000 mg | DELAYED_RELEASE_TABLET | Freq: Two times a day (BID) | ORAL | Status: DC
  Administered 2018-06-05 – 2018-06-07 (×3): 40 mg via ORAL
  Filled 2018-06-05 (×3): qty 1

## 2018-06-05 MED ORDER — FENTANYL CITRATE (PF) 100 MCG/2ML IJ SOLN
INTRAMUSCULAR | Status: DC | PRN
  Administered 2018-06-05: 50 ug via INTRAVENOUS

## 2018-06-05 MED ORDER — MAGNESIUM HYDROXIDE 400 MG/5ML PO SUSP
30.0000 mL | Freq: Every day | ORAL | Status: DC
  Administered 2018-06-05 – 2018-06-07 (×3): 30 mL via ORAL
  Filled 2018-06-05 (×3): qty 30

## 2018-06-05 MED ORDER — MIDAZOLAM HCL 2 MG/2ML IJ SOLN
INTRAMUSCULAR | Status: AC
  Filled 2018-06-05: qty 2

## 2018-06-05 MED ORDER — ONDANSETRON HCL 4 MG/2ML IJ SOLN: 4.0000 mg | Freq: Four times a day (QID) | INTRAMUSCULAR | Status: DC | PRN

## 2018-06-05 MED ORDER — OXYCODONE HCL 5 MG PO TABS
5.0000 mg | ORAL_TABLET | ORAL | Status: DC | PRN
  Administered 2018-06-05 – 2018-06-06 (×3): 5 mg via ORAL
  Filled 2018-06-05 (×3): qty 1

## 2018-06-05 MED ORDER — METOCLOPRAMIDE HCL 10 MG PO TABS
10.0000 mg | ORAL_TABLET | Freq: Three times a day (TID) | ORAL | Status: DC
  Administered 2018-06-05 – 2018-06-07 (×4): 10 mg via ORAL
  Filled 2018-06-05 (×4): qty 1

## 2018-06-05 MED ORDER — SODIUM CHLORIDE 0.9 % IV SOLN
INTRAVENOUS | Status: DC | PRN
  Administered 2018-06-05: 60 mL

## 2018-06-05 MED ORDER — PROPOFOL 500 MG/50ML IV EMUL
INTRAVENOUS | Status: AC
  Filled 2018-06-05: qty 50

## 2018-06-05 MED ORDER — TRANEXAMIC ACID-NACL 1000-0.7 MG/100ML-% IV SOLN
1000.0000 mg | Freq: Once | INTRAVENOUS | Status: AC
  Administered 2018-06-05: 1000 mg via INTRAVENOUS
  Filled 2018-06-05: qty 100

## 2018-06-05 MED ORDER — TRAMADOL HCL 50 MG PO TABS
50.0000 mg | ORAL_TABLET | ORAL | Status: DC | PRN
  Administered 2018-06-05 – 2018-06-07 (×5): 100 mg via ORAL
  Filled 2018-06-05 (×5): qty 2

## 2018-06-05 MED ORDER — DEXAMETHASONE SODIUM PHOSPHATE 10 MG/ML IJ SOLN
8.0000 mg | Freq: Once | INTRAMUSCULAR | Status: AC
  Administered 2018-06-05: 8 mg via INTRAVENOUS

## 2018-06-05 MED ORDER — OXYCODONE HCL 5 MG PO TABS
10.0000 mg | ORAL_TABLET | ORAL | Status: DC | PRN
  Administered 2018-06-06 – 2018-06-07 (×2): 10 mg via ORAL
  Filled 2018-06-05 (×2): qty 2

## 2018-06-05 MED ORDER — CEFAZOLIN SODIUM-DEXTROSE 2-4 GM/100ML-% IV SOLN
INTRAVENOUS | Status: AC
  Filled 2018-06-05: qty 100

## 2018-06-05 MED ORDER — BISOPROLOL FUMARATE 5 MG PO TABS
10.0000 mg | ORAL_TABLET | Freq: Every day | ORAL | Status: DC
  Administered 2018-06-07: 10 mg via ORAL
  Filled 2018-06-05 (×2): qty 2

## 2018-06-05 MED ORDER — TRANEXAMIC ACID-NACL 1000-0.7 MG/100ML-% IV SOLN
INTRAVENOUS | Status: DC | PRN
  Administered 2018-06-05: 1000 mg via INTRAVENOUS

## 2018-06-05 MED ORDER — GLYCOPYRROLATE 0.2 MG/ML IJ SOLN
INTRAMUSCULAR | Status: DC | PRN
  Administered 2018-06-05: 0.2 mg via INTRAVENOUS

## 2018-06-05 MED ORDER — ACETAMINOPHEN 10 MG/ML IV SOLN
INTRAVENOUS | Status: DC | PRN
  Administered 2018-06-05: 1000 mg via INTRAVENOUS

## 2018-06-05 MED ORDER — PROPOFOL 10 MG/ML IV BOLUS
INTRAVENOUS | Status: DC | PRN
  Administered 2018-06-05: 20 mg via INTRAVENOUS

## 2018-06-05 MED ORDER — SODIUM CHLORIDE 0.9 % IV SOLN
INTRAVENOUS | Status: DC
  Administered 2018-06-05 – 2018-06-06 (×2): via INTRAVENOUS

## 2018-06-05 MED ORDER — SENNOSIDES-DOCUSATE SODIUM 8.6-50 MG PO TABS
1.0000 | ORAL_TABLET | Freq: Two times a day (BID) | ORAL | Status: DC
  Administered 2018-06-05 – 2018-06-07 (×4): 1 via ORAL
  Filled 2018-06-05 (×3): qty 1

## 2018-06-05 MED ORDER — DEXAMETHASONE SODIUM PHOSPHATE 10 MG/ML IJ SOLN
INTRAMUSCULAR | Status: AC
  Administered 2018-06-05: 8 mg via INTRAVENOUS
  Filled 2018-06-05: qty 1

## 2018-06-05 MED ORDER — GABAPENTIN 300 MG PO CAPS
300.0000 mg | ORAL_CAPSULE | Freq: Every day | ORAL | Status: DC
  Administered 2018-06-05 – 2018-06-06 (×2): 300 mg via ORAL
  Filled 2018-06-05 (×2): qty 1

## 2018-06-05 MED ORDER — PRIMIDONE 50 MG PO TABS
50.0000 mg | ORAL_TABLET | Freq: Every day | ORAL | Status: DC
  Filled 2018-06-05 (×2): qty 1

## 2018-06-05 MED ORDER — PHENOL 1.4 % MT LIQD
1.0000 | OROMUCOSAL | Status: DC | PRN
  Filled 2018-06-05: qty 177

## 2018-06-05 MED ORDER — CELECOXIB 200 MG PO CAPS
ORAL_CAPSULE | ORAL | Status: AC
  Administered 2018-06-05: 400 mg via ORAL
  Filled 2018-06-05: qty 2

## 2018-06-05 MED ORDER — ACETAMINOPHEN 325 MG PO TABS: 325.0000 mg | ORAL_TABLET | Freq: Four times a day (QID) | ORAL | Status: DC | PRN

## 2018-06-05 MED ORDER — CEFAZOLIN SODIUM-DEXTROSE 2-3 GM-%(50ML) IV SOLR
INTRAVENOUS | Status: DC | PRN
  Administered 2018-06-05: 2 g via INTRAVENOUS

## 2018-06-05 MED ORDER — GABAPENTIN 300 MG PO CAPS
ORAL_CAPSULE | ORAL | Status: AC
  Filled 2018-06-05: qty 1

## 2018-06-05 SURGICAL SUPPLY — 71 items
ATTUNE MED DOME PAT 41 KNEE (Knees) ×2 IMPLANT
ATTUNE MED DOME PAT 41MM KNEE (Knees) ×1 IMPLANT
ATTUNE PS FEM LT SZ 6 CEM KNEE (Femur) ×3 IMPLANT
ATTUNE PSRP INSR SZ6 5 KNEE (Insert) ×2 IMPLANT
ATTUNE PSRP INSR SZ6 5MM KNEE (Insert) ×1 IMPLANT
BASE TIBIAL ROT PLAT SZ 7 KNEE (Knees) ×1 IMPLANT
BATTERY INSTRU NAVIGATION (MISCELLANEOUS) ×12 IMPLANT
BLADE SAW 70X12.5 (BLADE) ×3 IMPLANT
BLADE SAW 90X13X1.19 OSCILLAT (BLADE) ×3 IMPLANT
BLADE SAW 90X25X1.19 OSCILLAT (BLADE) ×3 IMPLANT
CANISTER SUCT 1200ML W/VALVE (MISCELLANEOUS) ×3 IMPLANT
CANISTER SUCT 3000ML PPV (MISCELLANEOUS) ×6 IMPLANT
CEMENT HV SMART SET (Cement) ×6 IMPLANT
COOLER POLAR GLACIER W/PUMP (MISCELLANEOUS) ×3 IMPLANT
COVER WAND RF STERILE (DRAPES) ×3 IMPLANT
CUFF TOURN 24 STER (MISCELLANEOUS) IMPLANT
CUFF TOURN 30 STER DUAL PORT (MISCELLANEOUS) ×3 IMPLANT
DRAPE SHEET LG 3/4 BI-LAMINATE (DRAPES) ×3 IMPLANT
DRSG DERMACEA 8X12 NADH (GAUZE/BANDAGES/DRESSINGS) ×3 IMPLANT
DRSG OPSITE POSTOP 4X14 (GAUZE/BANDAGES/DRESSINGS) ×3 IMPLANT
DRSG TEGADERM 4X4.75 (GAUZE/BANDAGES/DRESSINGS) ×3 IMPLANT
DURAPREP 26ML APPLICATOR (WOUND CARE) ×6 IMPLANT
ELECT CAUTERY BLADE 6.4 (BLADE) ×3 IMPLANT
ELECT REM PT RETURN 9FT ADLT (ELECTROSURGICAL) ×3
ELECTRODE REM PT RTRN 9FT ADLT (ELECTROSURGICAL) ×1 IMPLANT
EX-PIN ORTHOLOCK NAV 4X150 (PIN) ×6 IMPLANT
GLOVE BIOGEL M STRL SZ7.5 (GLOVE) ×6 IMPLANT
GLOVE BIOGEL PI IND STRL 9 (GLOVE) ×1 IMPLANT
GLOVE BIOGEL PI INDICATOR 9 (GLOVE) ×2
GLOVE INDICATOR 8.0 STRL GRN (GLOVE) ×3 IMPLANT
GLOVE SURG SYN 9.0  PF PI (GLOVE) ×2
GLOVE SURG SYN 9.0 PF PI (GLOVE) ×1 IMPLANT
GOWN STRL REUS W/ TWL LRG LVL3 (GOWN DISPOSABLE) ×2 IMPLANT
GOWN STRL REUS W/TWL 2XL LVL3 (GOWN DISPOSABLE) ×3 IMPLANT
GOWN STRL REUS W/TWL LRG LVL3 (GOWN DISPOSABLE) ×4
HEMOVAC 400CC 10FR (MISCELLANEOUS) ×3 IMPLANT
HOLDER FOLEY CATH W/STRAP (MISCELLANEOUS) ×3 IMPLANT
HOOD PEEL AWAY FLYTE STAYCOOL (MISCELLANEOUS) ×6 IMPLANT
KIT TURNOVER KIT A (KITS) ×3 IMPLANT
KNIFE SCULPS 14X20 (INSTRUMENTS) ×3 IMPLANT
LABEL OR SOLS (LABEL) ×3 IMPLANT
NDL SAFETY ECLIPSE 18X1.5 (NEEDLE) ×1 IMPLANT
NEEDLE HYPO 18GX1.5 SHARP (NEEDLE) ×2
NEEDLE SPNL 20GX3.5 QUINCKE YW (NEEDLE) ×6 IMPLANT
NS IRRIG 500ML POUR BTL (IV SOLUTION) ×3 IMPLANT
PACK TOTAL KNEE (MISCELLANEOUS) ×3 IMPLANT
PAD WRAPON POLAR KNEE (MISCELLANEOUS) ×1 IMPLANT
PIN DRILL QUICK PACK ×3 IMPLANT
PIN FIXATION 1/8DIA X 3INL (PIN) ×9 IMPLANT
PULSAVAC PLUS IRRIG FAN TIP (DISPOSABLE) ×3
SOL .9 NS 3000ML IRR  AL (IV SOLUTION) ×2
SOL .9 NS 3000ML IRR UROMATIC (IV SOLUTION) ×1 IMPLANT
SOL PREP PVP 2OZ (MISCELLANEOUS) ×3
SOLUTION PREP PVP 2OZ (MISCELLANEOUS) ×1 IMPLANT
SPONGE DRAIN TRACH 4X4 STRL 2S (GAUZE/BANDAGES/DRESSINGS) ×3 IMPLANT
STAPLER SKIN PROX 35W (STAPLE) ×3 IMPLANT
STOCKINETTE IMPERV 14X48 (MISCELLANEOUS) ×3 IMPLANT
STRAP TIBIA SHORT (MISCELLANEOUS) ×3 IMPLANT
SUCTION FRAZIER HANDLE 10FR (MISCELLANEOUS) ×2
SUCTION TUBE FRAZIER 10FR DISP (MISCELLANEOUS) ×1 IMPLANT
SUT VIC AB 0 CT1 36 (SUTURE) ×3 IMPLANT
SUT VIC AB 1 CT1 36 (SUTURE) ×6 IMPLANT
SUT VIC AB 2-0 CT2 27 (SUTURE) ×3 IMPLANT
SYR 20CC LL (SYRINGE) ×3 IMPLANT
SYR 30ML LL (SYRINGE) ×6 IMPLANT
TIBIAL BASE ROT PLAT SZ 7 KNEE (Knees) ×3 IMPLANT
TIP FAN IRRIG PULSAVAC PLUS (DISPOSABLE) ×1 IMPLANT
TOWEL OR 17X26 4PK STRL BLUE (TOWEL DISPOSABLE) ×3 IMPLANT
TOWER CARTRIDGE SMART MIX (DISPOSABLE) ×3 IMPLANT
TRAY FOLEY MTR SLVR 16FR STAT (SET/KITS/TRAYS/PACK) ×3 IMPLANT
WRAPON POLAR PAD KNEE (MISCELLANEOUS) ×3

## 2018-06-05 NOTE — Progress Notes (Signed)
   06/05/18 1900  Clinical Encounter Type  Visited With Patient;Family  Visit Type Initial (OR for AD)  Referral From Physician  Recommendations Follow-up on 06/06/2018 if possible  Spiritual Encounters  Spiritual Needs Emotional   Chaplain visited with the patient and wife and explained the AD. Patient was receptive and understood the concepts. The couple will review the materials and may contact the on-call chaplain on 06/06/2018 to complete the documentation. If not, Chaplain explained the process of completing the AD off-campus and returning a copy to [facility] Records.

## 2018-06-05 NOTE — Transfer of Care (Signed)
Immediate Anesthesia Transfer of Care Note  Patient: Rodney Rojas  Procedure(s) Performed: Procedure(s): COMPUTER ASSISTED TOTAL KNEE ARTHROPLASTY (Left)  Patient Location: PACU  Anesthesia Type:Spinal  Level of Consciousness: awake, alert  and oriented  Airway & Oxygen Therapy: Patient Spontanous Breathing and Patient connected to face mask oxygen  Post-op Assessment: Report given to RN and Post -op Vital signs reviewed and stable  Post vital signs: Reviewed and stable  Last Vitals:  Vitals:   06/05/18 0916 06/05/18 1520  BP: (!) 186/91 117/74  Pulse: 67 75  Resp: 16 12  Temp: 36.9 C 37 C  SpO2: 59% 93%    Complications: No apparent anesthesia complications

## 2018-06-05 NOTE — Op Note (Signed)
OPERATIVE NOTE  DATE OF SURGERY:  06/05/2018  PATIENT NAME:  Rodney Rojas   DOB: Aug 17, 1960  MRN: [id number]  PRE-OPERATIVE DIAGNOSIS: Degenerative arthrosis of the left knee, primary  POST-OPERATIVE DIAGNOSIS:  Same  PROCEDURE:  Left total knee arthroplasty using computer-assisted navigation  SURGEON:  Marciano Sequin. M.D.  ASSISTANT:  Vance Peper, PA (present and scrubbed throughout the case, critical for assistance with exposure, retraction, instrumentation, and closure)  ANESTHESIA: spinal  ESTIMATED BLOOD LOSS: 50 mL  FLUIDS REPLACED: 1100 mL of crystalloid  TOURNIQUET TIME: 102 minutes  DRAINS: 2 medium Hemovac drains  SOFT TISSUE RELEASES: Anterior cruciate ligament, posterior cruciate ligament, deep medial collateral ligament, patellofemoral ligament  IMPLANTS UTILIZED: DePuy Attune size 6 posterior stabilized femoral component (cemented), size 7 rotating platform tibial component (cemented), 41 mm medialized dome patella (cemented), and a 5 mm stabilized rotating platform polyethylene insert.  INDICATIONS FOR SURGERY: Rodney Rojas is a 57 y.o. year old male with a long history of progressive knee pain. X-rays demonstrated severe degenerative changes in tricompartmental fashion. The patient had not seen any significant improvement despite conservative nonsurgical intervention. After discussion of the risks and benefits of surgical intervention, the patient expressed understanding of the risks benefits and agree with plans for total knee arthroplasty.   The risks, benefits, and alternatives were discussed at length including but not limited to the risks of infection, bleeding, nerve injury, stiffness, blood clots, the need for revision surgery, cardiopulmonary complications, among others, and they were willing to proceed.  PROCEDURE IN DETAIL: The patient was brought into the operating room and, after adequate spinal anesthesia was achieved, a tourniquet was placed  on the patient's upper thigh. The patient's knee and leg were cleaned and prepped with alcohol and DuraPrep and draped in the usual sterile fashion. A "timeout" was performed as per usual protocol. The lower extremity was exsanguinated using an Esmarch, and the tourniquet was inflated to 300 mmHg. An anterior longitudinal incision was made followed by a standard mid vastus approach. The deep fibers of the medial collateral ligament were elevated in a subperiosteal fashion off of the medial flare of the tibia so as to maintain a continuous soft tissue sleeve. The patella was subluxed laterally and the patellofemoral ligament was incised. Inspection of the knee demonstrated severe degenerative changes with full-thickness loss of articular cartilage. Osteophytes were debrided using a rongeur. Anterior and posterior cruciate ligaments were excised. Two 4.0 mm Schanz pins were inserted in the femur and into the tibia for attachment of the array of trackers used for computer-assisted navigation. Hip center was identified using a circumduction technique. Distal landmarks were mapped using the computer. The distal femur and proximal tibia were mapped using the computer. The distal femoral cutting guide was positioned using computer-assisted navigation so as to achieve a 5 distal valgus cut. The femur was sized and it was felt that a size 6 femoral component was appropriate. A size 6 femoral cutting guide was positioned and the anterior cut was performed and verified using the computer. This was followed by completion of the posterior and chamfer cuts. Femoral cutting guide for the central box was then positioned in the center box cut was performed.  Attention was then directed to the proximal tibia. Medial and lateral menisci were excised. The extramedullary tibial cutting guide was positioned using computer-assisted navigation so as to achieve a 0 varus-valgus alignment and 3 posterior slope. The cut was performed and  verified using the computer. The proximal tibia  was sized and it was felt that a size 7 tibial tray was appropriate. Tibial and femoral trials were inserted followed by insertion of a 5 mm polyethylene insert. This allowed for excellent mediolateral soft tissue balancing both in flexion and in full extension. Finally, the patella was cut and prepared so as to accommodate a 41 mm medialized dome patella. A patella trial was placed and the knee was placed through a range of motion with excellent patellar tracking appreciated. The femoral trial was removed after debridement of posterior osteophytes. The central post-hole for the tibial component was reamed followed by insertion of a keel punch. Tibial trials were then removed. Cut surfaces of bone were irrigated with copious amounts of normal saline with antibiotic solution using pulsatile lavage and then suctioned dry. Polymethylmethacrylate cement was prepared in the usual fashion using a vacuum mixer. Cement was applied to the cut surface of the proximal tibia as well as along the undersurface of a size 7 rotating platform tibial component. Tibial component was positioned and impacted into place. Excess cement was removed using [employer] fast streamer. Cement was then applied to the cut surfaces of the femur as well as along the posterior flanges of the size 6 femoral component. The femoral component was positioned and impacted into place. Excess cement was removed using [employer] fast streamer. A 5 mm polyethylene trial was inserted and the knee was brought into full extension with steady axial compression applied. Finally, cement was applied to the backside of a 41 mm medialized dome patella and the patellar component was positioned and patellar clamp applied. Excess cement was removed using [employer] fast streamer. After adequate curing of the cement, the tourniquet was deflated after a total tourniquet time of 102 minutes. Hemostasis was achieved using electrocautery. The knee was  irrigated with copious amounts of normal saline with antibiotic solution using pulsatile lavage and then suctioned dry. 20 mL of 1.3% Exparel and 60 mL of 0.25% Marcaine in 40 mL of normal saline was injected along the posterior capsule, medial and lateral gutters, and along the arthrotomy site. A 5 mm stabilized rotating platform polyethylene insert was inserted and the knee was placed through a range of motion with excellent mediolateral soft tissue balancing appreciated and excellent patellar tracking noted. 2 medium drains were placed in the wound bed and brought out through separate stab incisions. The medial parapatellar portion of the incision was reapproximated using interrupted sutures of #1 Vicryl. Subcutaneous tissue was approximated in layers using first #0 Vicryl followed #2-0 Vicryl. The skin was approximated with skin staples. A sterile dressing was applied.  The patient tolerated the procedure well and was transported to the recovery room in stable condition.    Lachrisha Ziebarth P. Holley Bouche., M.D.

## 2018-06-05 NOTE — Anesthesia Post-op Follow-up Note (Signed)
Anesthesia QCDR form completed.        

## 2018-06-05 NOTE — Anesthesia Procedure Notes (Signed)
Spinal  Patient location during procedure: OR Start time: 06/05/2018 11:44 AM End time: 06/05/2018 11:45 AM Staffing Resident/CRNA: Bernardo Heater, CRNA Performed: resident/CRNA  Preanesthetic Checklist Completed: patient identified, site marked, surgical consent, pre-op evaluation, timeout performed, IV checked, risks and benefits discussed and monitors and equipment checked Spinal Block Patient position: sitting Prep: ChloraPrep Patient monitoring: heart rate, continuous pulse ox, blood pressure and cardiac monitor Approach: midline Location: L2-3 Injection technique: single-shot Needle Needle type: Introducer and Pencan  Needle gauge: 24 G Needle length: 9 cm Additional Notes Negative paresthesia. Negative blood return. Positive free-flowing CSF. Expiration date of kit checked and confirmed. Patient tolerated procedure well, without complications.

## 2018-06-05 NOTE — Anesthesia Preprocedure Evaluation (Signed)
Anesthesia Evaluation  Patient identified by MRN, date of birth, ID band Patient awake    Reviewed: Allergy & Precautions, NPO status , Patient's Chart, lab work & pertinent test results  History of Anesthesia Complications (+) PROLONGED EMERGENCE and history of anesthetic complications (hard time waking up after colonoscopy)  Airway Mallampati: II       Dental   Pulmonary neg sleep apnea, neg COPD,           Cardiovascular (-) hypertension(-) Past MI and (-) CHF (-) dysrhythmias (-) Valvular Problems/Murmurs     Neuro/Psych neg Seizures    GI/Hepatic Neg liver ROS, GERD  Medicated,  Endo/Other  neg diabetes  Renal/GU negative Renal ROS     Musculoskeletal   Abdominal   Peds  Hematology   Anesthesia Other Findings   Reproductive/Obstetrics                             Anesthesia Physical Anesthesia Plan  ASA: II  Anesthesia Plan: Spinal   Post-op Pain Management:    Induction:   PONV Risk Score and Plan:   Airway Management Planned: Nasal Cannula  Additional Equipment:   Intra-op Plan:   Post-operative Plan:   Informed Consent: I have reviewed the patients History and Physical, chart, labs and discussed the procedure including the risks, benefits and alternatives for the proposed anesthesia with the patient or authorized representative who has indicated his/her understanding and acceptance.     Plan Discussed with:   Anesthesia Plan Comments:         Anesthesia Quick Evaluation

## 2018-06-05 NOTE — H&P (Signed)
The patient has been re-examined, and the chart reviewed, and there have been no interval changes to the documented history and physical.    The risks, benefits, and alternatives have been discussed at length. The patient expressed understanding of the risks benefits and agreed with plans for surgical intervention.  James P. Hooten, Jr. M.D.    

## 2018-06-05 NOTE — NC FL2 (Signed)
  [place] LEVEL OF CARE SCREENING TOOL     IDENTIFICATION  Patient Name: Rodney Rojas Birthdate: [birth year] Sex: male Admission Date (Current Location): 06/05/2018  [place] and [place] Number:  Engineering geologist and Address:  [facility], [address], [place], [place] [address]      Provider Number: [id number]  Attending Physician Name and Address:  Dereck Leep, MD  Relative Name and Phone Number:       Current Level of Care: Hospital Recommended Level of Care: [place] Prior Approval Number:    Date Approved/Denied:   PASRR Number: [id number])  Discharge Plan: SNF    Current Diagnoses: Patient Active Problem List   Diagnosis Date Noted  . S/P total knee arthroplasty 06/05/2018  . Primary osteoarthritis of left knee 07/21/2017  . Status post total right knee replacement 02/25/2016  . Chronic pain of left knee 02/24/2015  . GERD (gastroesophageal reflux disease) 02/24/2015  . Urinary hesitancy 02/24/2015  . Fam hx-ischem heart disease 09/01/2009  . Family history of prostate cancer 09/01/2009  . Umbilical hernia without obstruction and without gangrene 09/01/2009  . Benign essential tremor 01/14/2009  . Allergy to insects and arachnids 08/09/1998    Orientation RESPIRATION BLADDER Height & Weight     Self, Time, Situation, Place  Normal Continent Weight:   Height:     BEHAVIORAL SYMPTOMS/MOOD NEUROLOGICAL BOWEL NUTRITION STATUS      Continent Diet(Diet: Regular )  AMBULATORY STATUS COMMUNICATION OF NEEDS Skin   Extensive Assist Verbally Surgical wounds(Incision: Left Knee. )                       Personal Care Assistance Level of Assistance  Bathing, Feeding, Dressing Bathing Assistance: Limited assistance Feeding assistance: Independent Dressing Assistance: Limited assistance     Functional Limitations Info  Sight, Hearing, Speech Sight Info: Adequate Hearing Info:  Adequate Speech Info: Adequate    SPECIAL CARE FACTORS FREQUENCY  PT (By licensed PT), OT (By licensed OT)     PT Frequency: (5) OT Frequency: (5)            Contractures      Additional Factors Info  Code Status, Allergies Code Status Info: (Full Code. ) Allergies Info: (Bee Venom)           Current Medications (06/05/2018):  This is the current hospital active medication list Current Facility-Administered Medications  Medication Dose Route Frequency Provider Last Rate Last Dose  . ceFAZolin (ANCEF) 2-4 GM/100ML-% IVPB           . chlorhexidine (HIBICLENS) 4 % liquid 4 application  60 mL Topical Once Hooten, Laurice Record, MD      . gabapentin (NEURONTIN) 300 MG capsule           . lactated ringers infusion   Intravenous Continuous Martha Clan, MD   Stopped at 06/05/18 1521  . tranexamic acid (CYKLOKAPRON) IVPB 1,000 mg  1,000 mg Intravenous Once Hooten, Laurice Record, MD         Discharge Medications: Please see discharge summary for a list of discharge medications.  Relevant Imaging Results:  Relevant Lab Results:   Additional Information (SSN: [id number])  Supriya Beaston, Veronia Beets, LCSW

## 2018-06-06 ENCOUNTER — Encounter: Payer: Self-pay | Admitting: Orthopedic Surgery

## 2018-06-06 MED ORDER — PRIMIDONE 50 MG PO TABS
50.0000 mg | ORAL_TABLET | Freq: Every day | ORAL | Status: DC
  Administered 2018-06-06: 50 mg via ORAL
  Filled 2018-06-06 (×2): qty 1

## 2018-06-06 MED ORDER — OXYCODONE HCL 5 MG PO TABS: 5.0000 mg | ORAL_TABLET | ORAL | 0 refills | Status: DC | PRN

## 2018-06-06 MED ORDER — ENOXAPARIN SODIUM 40 MG/0.4ML ~~LOC~~ SOLN: 40.0000 mg | SUBCUTANEOUS | 0 refills | Status: DC

## 2018-06-06 MED ORDER — TRAMADOL HCL 50 MG PO TABS: 50.0000 mg | ORAL_TABLET | ORAL | 0 refills | Status: DC | PRN

## 2018-06-06 NOTE — Discharge Summary (Signed)
Physician Discharge Summary  Patient ID: Rodney Rojas MRN: [id number] DOB/AGE: 1960/09/10 57 y.o.  Admit date: 06/05/2018 Discharge date: 06/07/2018  Admission Diagnoses:  primary osteoarthritis of left knee   Discharge Diagnoses: Patient Active Problem List   Diagnosis Date Noted  . S/P total knee arthroplasty 06/05/2018  . Primary osteoarthritis of left knee 07/21/2017  . Status post total right knee replacement 02/25/2016  . Chronic pain of left knee 02/24/2015  . GERD (gastroesophageal reflux disease) 02/24/2015  . Urinary hesitancy 02/24/2015  . Fam hx-ischem heart disease 09/01/2009  . Family history of prostate cancer 09/01/2009  . Umbilical hernia without obstruction and without gangrene 09/01/2009  . Benign essential tremor 01/14/2009  . Allergy to insects and arachnids 08/09/1998    Past Medical History:  Diagnosis Date  . Arthritis   . Complication of anesthesia    slow to wake up after colonoscopy  . GERD (gastroesophageal reflux disease)   . Headache   . History of chicken pox   . History of kidney stones   . Prostate enlargement   . Tremors of nervous system      Transfusion: No transfusions during this admission   Consultants (if any):   Discharged Condition: Improved  Hospital Course: Rodney Rojas is an 57 y.o. male who was admitted 06/05/2018 with a diagnosis of degenerative arthrosis left knee and went to the operating room on 06/05/2018 and underwent the above named procedures.    Surgeries:Procedure(s): COMPUTER ASSISTED TOTAL KNEE ARTHROPLASTY on 06/05/2018  PRE-OPERATIVE DIAGNOSIS: Degenerative arthrosis of the left knee, primary  POST-OPERATIVE DIAGNOSIS:  Same  PROCEDURE:  Left total knee arthroplasty using computer-assisted navigation  SURGEON:  Marciano Sequin. M.D.  ASSISTANT:  Vance Peper, PA (present and scrubbed throughout the case, critical for assistance with exposure, retraction, instrumentation, and  closure)  ANESTHESIA: spinal  ESTIMATED BLOOD LOSS: 50 mL  FLUIDS REPLACED: 1100 mL of crystalloid  TOURNIQUET TIME: 102 minutes  DRAINS: 2 medium Hemovac drains  SOFT TISSUE RELEASES: Anterior cruciate ligament, posterior cruciate ligament, deep medial collateral ligament, patellofemoral ligament  IMPLANTS UTILIZED: DePuy Attune size 6 posterior stabilized femoral component (cemented), size 7 rotating platform tibial component (cemented), 41 mm medialized dome patella (cemented), and a 5 mm stabilized rotating platform polyethylene insert.  INDICATIONS FOR SURGERY: Rodney Rojas is a 57 y.o. year old male with a long history of progressive knee pain. X-rays demonstrated severe degenerative changes in tricompartmental fashion. The patient had not seen any significant improvement despite conservative nonsurgical intervention. After discussion of the risks and benefits of surgical intervention, the patient expressed understanding of the risks benefits and agree with plans for total knee arthroplasty.   The risks, benefits, and alternatives were discussed at length including but not limited to the risks of infection, bleeding, nerve injury, stiffness, blood clots, the need for revision surgery, cardiopulmonary complications, among others, and they were willing to proceed.  Patient tolerated the surgery well. No complications .Patient was taken to PACU where she was stabilized and then transferred to the orthopedic floor.  Patient started on Lovenox 30 mg q 12 hrs. Foot pumps applied bilaterally at 80 mm hgb. Heels elevated off bed with rolled towels. No evidence of DVT. Calves non tender. Negative Homan. Physical therapy started on day #1 for gait training and transfer with OT starting on  day #1 for ADL and assisted devices. Patient has done well with therapy. Ambulated greater than 200 feet upon being discharged.  Was able to ascend  and descend 4 steps safely and  independently  Patient's IV And Foley were discontinued on day #1 with Hemovac being discontinued on day #2. Dressing was changed on day 2 prior to patient being discharged   He was given perioperative antibiotics:  Anti-infectives (From admission, onward)   Start     Dose/Rate Route Frequency Ordered Stop   06/05/18 1800  ceFAZolin (ANCEF) IVPB 2g/100 mL premix     2 g 200 mL/hr over 30 Minutes Intravenous Every 6 hours 06/05/18 1638 06/06/18 1759   06/05/18 0916  ceFAZolin (ANCEF) 2-4 GM/100ML-% IVPB    Note to Pharmacy:  Phineas Real   : cabinet override      06/05/18 0916 06/05/18 2144   06/05/18 0600  ceFAZolin (ANCEF) IVPB 2g/100 mL premix  Status:  Discontinued     2 g 200 mL/hr over 30 Minutes Intravenous On call to O.R. 06/05/18 0002 06/05/18 0919    .  He was fitted with AV 1 compression foot pump devices, instructed on heel pumps, early ambulation, and fitted with TED stockings bilaterally for DVT prophylaxis.  He benefited maximally from the hospital stay and there were no complications.    Recent vital signs:  Vitals:   06/06/18 0028 06/06/18 0515  BP: 126/76 123/84  Pulse: 67 (!) 52  Resp: 18 16  Temp: 98.8 F (37.1 C) 97.7 F (36.5 C)  SpO2: 98% 95%    Recent laboratory studies:  Lab Results  Component Value Date   HGB 16.1 05/25/2018   HGB 12.0 (L) 02/27/2016   HGB 12.4 (L) 02/26/2016   Lab Results  Component Value Date   WBC 7.3 05/25/2018   PLT 128 (L) 05/25/2018   Lab Results  Component Value Date   INR 1.02 05/25/2018   Lab Results  Component Value Date   NA 138 05/25/2018   K 4.1 05/25/2018   CL 101 05/25/2018   CO2 27 05/25/2018   BUN 13 05/25/2018   CREATININE 0.88 05/25/2018   GLUCOSE 89 05/25/2018    Discharge Medications:   Allergies as of 06/06/2018      Reactions   Bee Venom Anaphylaxis, Itching, Other (See Comments)   Bee stings      Medication List    TAKE these medications   bisoprolol 10 MG tablet Commonly  known as:  ZEBETA TAKE 1 TABLET BY MOUTH ONCE DAILY   enoxaparin 40 MG/0.4ML injection Commonly known as:  LOVENOX Inject 0.4 mLs (40 mg total) into the skin daily for 14 days. Start taking on:  06/08/2018   EPIPEN 2-PAK 0.3 mg/0.3 mL Soaj injection Generic drug:  EPINEPHrine use as directed by prescriber What changed:  See the new instructions.   GLUCOSAMINE-MSM PO Take 2 tablets by mouth daily.   meloxicam 15 MG tablet Commonly known as:  MOBIC Take 15 mg by mouth daily.   oxyCODONE 5 MG immediate release tablet Commonly known as:  Oxy IR/ROXICODONE Take 1 tablet (5 mg total) by mouth every 4 (four) hours as needed for moderate pain (pain score 4-6).   primidone 50 MG tablet Commonly known as:  MYSOLINE TAKE 1 TO 2 TABLETS BY MOUTH AT BEDTIME IF NEEDED What changed:  See the new instructions.   tamsulosin 0.4 MG Caps capsule Commonly known as:  FLOMAX Take 0.4 mg by mouth daily as needed (urination).   TESTOSTERONE IL 1 application by Implant route every 6 (six) months.   traMADol 50 MG tablet Commonly known as:  ULTRAM Take 1-2  tablets (50-100 mg total) by mouth every 4 (four) hours as needed for moderate pain.            Durable Medical Equipment  (From admission, onward)         Start     Ordered   06/05/18 1639  DME Walker rolling  Once    Question:  Patient needs a walker to treat with the following condition  Answer:  Total knee replacement status   06/05/18 1638   06/05/18 1639  DME Bedside commode  Once    Question:  Patient needs a bedside commode to treat with the following condition  Answer:  Total knee replacement status   06/05/18 1638          Diagnostic Studies: Dg Knee Left Port  Result Date: 06/05/2018 CLINICAL DATA:  Postop knee replacement. EXAM: PORTABLE LEFT KNEE - 1-2 VIEW COMPARISON:  None. FINDINGS: AP and lateral views of the left knee status post total knee arthroplasty with postop drainage catheters in the suprapatellar  compartment and overlying skin staples in place. Expected soft tissue emphysema from recent surgery is noted. No immediate postoperative complications. Ghost tracks in the distal femoral and proximal tibial diaphysis are identified from prior intervention. Overlying skin staples are in place. IMPRESSION: No immediate complications status post left knee arthroplasty. Electronically Signed   By: Ashley Royalty M.D.   On: 06/05/2018 15:40    Disposition:   Discharge Instructions    Diet - low sodium heart healthy   Complete by:  As directed    Increase activity slowly   Complete by:  As directed       Follow-up Information    Watt Climes, PA On 06/20/2018.   Specialty:  Physician Assistant Why:  at 1:15pm Contact information: [place] [address] [phone]        Dereck Leep, MD On 07/18/2018.   Specialty:  Orthopedic Surgery Why:  at 10:00am Contact information: [place] [address] [phone]            Signed: Watt Climes 06/06/2018, 7:44 AM

## 2018-06-06 NOTE — Progress Notes (Signed)
Clinical Education officer, museum (CSW) received SNF consult. PT is recommending "Follow surgeon's recommendation for DC plan." RN case manager is aware of above. Please reconsult if future social work needs arise. CSW signing off.   [employer], LCSW [phone]

## 2018-06-06 NOTE — Evaluation (Signed)
Physical Therapy Evaluation Patient Details Name: Rodney Rojas MRN: [id number] DOB: 1961-01-14 Today's Date: 06/06/2018   History of Present Illness  Pt is a 57 y.o male s/p elective L TKA 06/05/18. PMH significant for atrthritis, GERD, umbillical hernia repair, and R TKA.   Clinical Impression  Pt alert and eager to work with PT upon arrival; excellent overall effort with all mobility. Pt demonstrating 10 SLR independently thus no KI utilized for [employer]. Pt independent with all bed mobility with good management of L LE. Pt quick to rise from seated eager to ambulate, no physical assist required for STS. Pt ambulating 200 feet well with moderate reliance on UE however continuous rolling of RW. Pt with no subjective complaints and minimally fatigued following ambulation. VC for trunk extension and continue focus on maintaining minimal to no limp. Pt is doing very well POD1 and will continue to benefit from skilled PT per MD recommendation following d/c.      Follow Up Recommendations Follow surgeon's recommendation for DC plan and follow-up therapies    Equipment Recommendations       Recommendations for Other Services       Precautions / Restrictions Precautions Precautions: Knee;Fall Precaution Booklet Issued: No Precaution Comments: issue precaution booklet this afternoon Restrictions Weight Bearing Restrictions: Yes LLE Weight Bearing: Weight bearing as tolerated      Mobility  Bed Mobility Overal bed mobility: Modified Independent Bed Mobility: Supine to Sit     Supine to sit: Modified independent (Device/Increase time)     General bed mobility comments: No physical assist required, good management of L LE independently  Transfers Overall transfer level: Needs assistance Equipment used: Rolling walker (2 wheeled) Transfers: Sit to/from Stand Sit to Stand: Supervision         General transfer comment: Pt with good effort quick to rise with no physical assist  required, steady upon standing and asymptomatic  Ambulation/Gait Ambulation/Gait assistance: Min guard Gait Distance (Feet): 200 Feet Assistive device: Rolling walker (2 wheeled)       General Gait Details: Pt with excellent effort, good gait pattern with equal step through gait B, moderate reliance on UE however consistent rolling of RW, negligible limp, decreased gait speed but steady t/o  Science writer    Modified Rankin (Stroke Patients Only)       Balance Overall balance assessment: No apparent balance deficits (not formally assessed)                                           Pertinent Vitals/Pain Pain Assessment: 0-10 Pain Score: 2  Pain Location: L knee Pain Intervention(s): Monitored during session;Premedicated before session;Ice applied    Home Living Family/patient expects to be discharged to:: Private residence Living Arrangements: Spouse/significant other Available Help at Discharge: Family;Available 24 hours/day Type of Home: House Home Access: Stairs to enter Entrance Stairs-Rails: Right Entrance Stairs-Number of Steps: 3 Home Layout: One level        Prior Function Level of Independence: Independent         Comments: Employed and very active on his feet for most of his job. Independent with all ADL's.      Hand Dominance        Extremity/Trunk Assessment   Upper Extremity Assessment Upper Extremity Assessment: Overall WFL for tasks assessed  Lower Extremity Assessment Lower Extremity Assessment: Generalized weakness(expected post op weakness L LE grossly >3+/5, R LE WFL)       Communication   Communication: No difficulties  Cognition Arousal/Alertness: Awake/alert Behavior During Therapy: WFL for tasks assessed/performed Overall Cognitive Status: Within Functional Limits for tasks assessed                                        General Comments      Exercises  Total Joint Exercises Short Arc Quad: AROM;Strengthening;Left;10 reps;Supine Heel Slides: AROM;Strengthening;Left;10 reps;Supine Hip ABduction/ADduction: AROM;Strengthening;Left;10 reps;Supine Straight Leg Raises: AROM;Left;10 reps;Supine Goniometric ROM: L knee: 0-90 deg   Assessment/Plan    PT Assessment Patient needs continued PT services  PT Problem List Decreased strength;Decreased range of motion;Decreased activity tolerance;Decreased mobility;Decreased safety awareness;Decreased knowledge of use of DME       PT Treatment Interventions DME instruction;Balance training;Modalities;Gait training;Stair training;Functional mobility training;Patient/family education;Therapeutic activities;Therapeutic exercise    PT Goals (Current goals can be found in the Care Plan section)  Acute Rehab PT Goals Patient Stated Goal: to go home and get back to active life PT Goal Formulation: With patient Time For Goal Achievement: 06/20/18 Potential to Achieve Goals: Good    Frequency BID   Barriers to discharge        Co-evaluation               AM-PAC PT "6 Clicks" Daily Activity  Outcome Measure Difficulty turning over in bed (including adjusting bedclothes, sheets and blankets)?: None Difficulty moving from lying on back to sitting on the side of the bed? : None Difficulty sitting down on and standing up from a chair with arms (e.g., wheelchair, bedside commode, etc,.)?: A Little Help needed moving to and from a bed to chair (including a wheelchair)?: A Little Help needed walking in hospital room?: A Little Help needed climbing 3-5 steps with a railing? : A Little 6 Click Score: 20    End of Session Equipment Utilized During Treatment: Gait belt Activity Tolerance: Patient tolerated treatment well Patient left: in chair;with call bell/phone within reach;with chair alarm set;with family/visitor present(B heels elevated with towel roll) Nurse Communication: Mobility status PT  Visit Diagnosis: Pain;Muscle weakness (generalized) (M62.81);Difficulty in walking, not elsewhere classified (R26.2);Other abnormalities of gait and mobility (R26.89) Pain - Right/Left: Left Pain - part of body: Knee    Time: 6606-3016 PT Time Calculation (min) (ACUTE ONLY): 23 min   Charges:   PT Evaluation $PT Eval Low Complexity: 1 Low PT Treatments $Gait Training: 8-22 mins        Ernie Avena, SPT 06/06/2018, 11:46 AM

## 2018-06-06 NOTE — Progress Notes (Signed)
Physical Therapy Treatment Patient Details Name: Rodney Rojas MRN: [id number] DOB: [birth year] Today's Date: 06/06/2018    History of Present Illness Pt is a 57 y.o male s/p elective L TKA 06/05/18. PMH significant for atrthritis, GERD, umbillical hernia repair, and R TKA.     PT Comments    Pt continues to progress well able to increase ambulation to 250 in addition to negotiating 4 stairs. Pt continues to demonstrate good strength during active exercises. Pt is quick to rise from sitting demonstrating no hesitation and great confidence with WBing. Pt ambulating a total of 250 feet with continuous rolling of RW, negligible limp, and no overt LOB. Stairs negotiated x4 with use of L rail. No hesitation, pt quick to negotiate, step to pattern, pt with good confidence overall requiring no physical assist. Pt continues to remain appropriate for d/c upon surgeon's recommendation, overall with great progress thus far.    Follow Up Recommendations  Follow surgeon's recommendation for DC plan and follow-up therapies     Equipment Recommendations       Recommendations for Other Services       Precautions / Restrictions Precautions Precautions: Knee;Fall Precaution Booklet Issued: Yes (comment) Precaution Comments: issue precaution booklet this afternoon Restrictions Weight Bearing Restrictions: Yes LLE Weight Bearing: Weight bearing as tolerated    Mobility  Bed Mobility Overal bed mobility: Modified Independent Bed Mobility: Sit to Supine     Supine to sit: Modified independent (Device/Increase time) Sit to supine: Modified independent (Device/Increase time)   General bed mobility comments: No physical assist required, good management of L LE independently  Transfers Overall transfer level: Modified independent Equipment used: Rolling walker (2 wheeled) Transfers: Sit to/from Stand Sit to Stand: Modified independent (Device/Increase time)         General transfer  comment: No physical assist required, quick to rise from seated x2, asymptomatic upon standing  Ambulation/Gait Ambulation/Gait assistance: Supervision Gait Distance (Feet): 250 Feet Assistive device: Rolling walker (2 wheeled)       General Gait Details: Pt with great effort continues to ambulate well with equal step lengths, minimal relaince on RW, negligible limp, and continuous rolling of RW   Stairs Stairs: Yes Stairs assistance: Min guard Stair Management: One rail Left Number of Stairs: 4 General stair comments: Step to pattern, quick to negotiate, pt confident during each step, no over LOB or physical assist required   Wheelchair Mobility    Modified Rankin (Stroke Patients Only)       Balance Overall balance assessment: No apparent balance deficits (not formally assessed)                                          Cognition Arousal/Alertness: Awake/alert Behavior During Therapy: WFL for tasks assessed/performed Overall Cognitive Status: Within Functional Limits for tasks assessed                                        Exercises Total Joint Exercises Short Arc Quad: AROM;Strengthening;Left;10 reps;Supine Heel Slides: AROM;Strengthening;Left;10 reps;Supine Hip ABduction/ADduction: AROM;Strengthening;Left;15 reps;Supine Straight Leg Raises: AROM;Left;10 reps;Supine Long Arc Quad: AROM;Left;10 reps;Seated Knee Flexion: AROM;Left;10 reps;Seated Goniometric ROM: L knee: 0-90 deg    General Comments        Pertinent Vitals/Pain Pain Assessment: 0-10 Pain Score: 1  Pain Location: L knee  Pain Intervention(s): Monitored during session    Home Living Family/patient expects to be discharged to:: Private residence Living Arrangements: Spouse/significant other Available Help at Discharge: Family;Available 24 hours/day Type of Home: House Home Access: Stairs to enter Entrance Stairs-Rails: Right Home Layout: One level         Prior Function Level of Independence: Independent      Comments: Employed and very active on his feet for most of his job. Independent with all ADL's.    PT Goals (current goals can now be found in the care plan section) Acute Rehab PT Goals Patient Stated Goal: to go home and get back to active life PT Goal Formulation: With patient Time For Goal Achievement: 06/20/18 Potential to Achieve Goals: Good Progress towards PT goals: Progressing toward goals    Frequency    BID      PT Plan Current plan remains appropriate    Co-evaluation              AM-PAC PT "6 Clicks" Daily Activity  Outcome Measure  Difficulty turning over in bed (including adjusting bedclothes, sheets and blankets)?: None Difficulty moving from lying on back to sitting on the side of the bed? : None Difficulty sitting down on and standing up from a chair with arms (e.g., wheelchair, bedside commode, etc,.)?: None Help needed moving to and from a bed to chair (including a wheelchair)?: A Little Help needed walking in hospital room?: A Little Help needed climbing 3-5 steps with a railing? : A Little 6 Click Score: 21    End of Session Equipment Utilized During Treatment: Gait belt Activity Tolerance: Patient tolerated treatment well Patient left: with call bell/phone within reach;in bed;with bed alarm set;with SCD's reapplied(L LE in bone foam ) Nurse Communication: Mobility status PT Visit Diagnosis: Pain;Muscle weakness (generalized) (M62.81);Difficulty in walking, not elsewhere classified (R26.2);Other abnormalities of gait and mobility (R26.89) Pain - Right/Left: Left Pain - part of body: Knee     Time: 1345-1410 PT Time Calculation (min) (ACUTE ONLY): 25 min  Charges:  $Gait Training: 8-22 mins                     Ernie Avena, SPT 06/06/2018, 2:39 PM

## 2018-06-06 NOTE — Evaluation (Signed)
Occupational Therapy Evaluation Patient Details Name: Rodney Rojas MRN: [id number] DOB: 02-16-1961 Today's Date: 06/06/2018    History of Present Illness Pt is a 57 y.o male s/p elective L TKA 06/05/18. PMH significant for atrthritis, GERD, umbillical hernia repair, and R TKA.    Clinical Impression   Pt seen for OT evaluation this date, POD#1 from above surgery. Pt was independent in all ADLs prior to surgery. Pt had R TKA 2.5 years ago. Prior to surgery, Pt enjoyed working, riding ATVs and [place]. Pt lives with spouse in a one level house with 2 steps to enter.  Pt is eager to return to PLOF with less pain and improved safety and independence. Pt currently requires minimal assist for LB dressing while in seated position, however, spouse is available 24/7 to provide any assistance needed. Pt/spouse instructed in polar care and compression sock mgt, falls prevention strategies, home/routines modifications, and DME/AE for LB bathing and dressing tasks. Do not currently anticipate any OT needs following this hospitalization. OT recommends pt to return home upon discharge.      Follow Up Recommendations  No OT follow up    Equipment Recommendations  (reacher and sock aid)    Recommendations for Other Services       Precautions / Restrictions Precautions Precautions: Knee;Fall  Restrictions Weight Bearing Restrictions: Yes LLE Weight Bearing: Weight bearing as tolerated      Mobility Bed Mobility Overal bed mobility: Independent Bed Mobility: Sit to Supine     Supine to sit: Independent Sit to supine: Independent     Transfers Overall transfer level: Modified independent Equipment used: Rolling walker (2 wheeled) Transfers: Sit to/from Stand Sit to Stand: Modified independent (Device/Increase time)         General transfer comment: No physical assist required, quick to rise from seated x2    Balance Overall balance assessment: Needs  assistance Sitting-balance support: Feet unsupported;No upper extremity supported Sitting balance-Leahy Scale: Good     Standing balance support: Single extremity supported;During functional activity Standing balance-Leahy Scale: Poor                             ADL either performed or assessed with clinical judgement   ADL Overall ADL's : Needs assistance/impaired Eating/Feeding: Sitting;Independent   Grooming: Standing;Supervision/safety   Upper Body Bathing: Supervision/ safety;Sitting   Lower Body Bathing: Minimal assistance;Sit to/from stand Lower Body Bathing Details (indicate cue type and reason): PRN Min A with wife availale 24/7  Upper Body Dressing : Sitting;Modified independent   Lower Body Dressing: Minimal assistance;Sit to/from stand Lower Body Dressing Details (indicate cue type and reason): PRN Min A with wife availale 24/7  Toilet Transfer: Supervision/safety;RW;Ambulation;Regular Toilet           Functional mobility during ADLs: Supervision/safety;Rolling walker       Vision Baseline Vision/History: Wears glasses Patient Visual Report: No change from baseline       Perception     Praxis      Pertinent Vitals/Pain Pain Assessment: 0-10 Pain Score: 5  Pain Location: pain in L hamstring(pain in L hamstring) Pain Descriptors / Indicators: Aching Pain Intervention(s): Monitored during session     Hand Dominance     Extremity/Trunk Assessment Upper Extremity Assessment Upper Extremity Assessment: Overall WFL for tasks assessed   Lower Extremity Assessment Lower Extremity Assessment: Defer to PT evaluation       Communication Communication Communication: No difficulties  Cognition Arousal/Alertness: Awake/alert Behavior During Therapy: WFL for tasks assessed/performed Overall Cognitive Status: Within Functional Limits for tasks assessed                                     General Comments  Hemovac full. RN  notified.    Exercises  Other Exercises: Pt educated in compression sock and polar care mgt including donning/doffing, wear schedule and positioning. Other Exercises: Pt educated in AE use for LB dressing tasks. Other Exercises: Pt educated in pet care mgt upon arrival back home. Other Exercises: Pt educated in home environment modifications to increase safety during ADL tasks.    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 Independent with all ADLs and IADLs        OT Problem List:        OT Treatment/Interventions:      OT Goals(Current goals can be found in the care plan section) Acute Rehab OT Goals Patient Stated Goal: to go home and get back to work and spending time with family OT Goal Formulation: All assessment and education complete, DC therapy Potential to Achieve Goals: Good  OT Frequency:     Barriers to D/C:            Co-evaluation              AM-PAC PT "6 Clicks" Daily Activity     Outcome Measure Help from another person eating meals?: None Help from another person taking care of personal grooming?: None Help from another person toileting, which includes using toliet, bedpan, or urinal?: None Help from another person bathing (including washing, rinsing, drying)?: A Little Help from another person to put on and  taking off regular upper body clothing?: None Help from another person to put on and taking off regular lower body clothing?: A Little 6 Click Score: 22   End of Session Equipment Utilized During Treatment: Gait belt;Rolling walker  Activity Tolerance: Patient tolerated treatment well Patient left: in bed;with call bell/phone within reach;with bed alarm set;with SCD's reapplied;with family/visitor present                   Time: 4315-4008 OT Time Calculation (min): 38 min Charges:     Jadene Pierini OTS  06/06/2018, 4:53 PM

## 2018-06-06 NOTE — Care Management Note (Signed)
Case Management Note  Patient Details  Name: Rodney Rojas MRN: [id number] Date of Birth: Mar 03, 1961  Subjective/Objective:   Met with patient and his wife at bedside to discuss transition of care. Patient has a walker, cane and elevated toilet seat. He prefers [facility] for HHPT. [facility]: [facility] [place] Cost of Lovenox is 5.00. It is anticipated patient will discharge home tomorrow following PT.                    Action/Plan: [facility] for HHPT, No DME. Lovenox ready.    Expected Discharge Date:                  Expected Discharge Plan:  [place]  In-House Referral:     Discharge planning Services  CM Consult  Post Acute Care Choice:  Home Health Choice offered to:  Patient, Spouse  DME Arranged:    DME Agency:     HH Arranged:  PT [place]:  [facility] (formerly [facility])  Status of Service:  In process, will continue to follow  If discussed at Long Length of Stay Meetings, dates discussed:    Additional Comments:  Jolly Mango, RN 06/06/2018, 3:49 PM

## 2018-06-06 NOTE — Progress Notes (Signed)
   Subjective: 1 Day Post-Op Procedure(s) (LRB): COMPUTER ASSISTED TOTAL KNEE ARTHROPLASTY (Left) Patient reports pain as mild.   Patient is well, and has had no acute complaints or problems We will start therapy today.  Patient did sit at the edge of the bed last night and dangle his leg off the side Plan is to go Home after hospital stay. no nausea and no vomiting Patient denies any chest pains or shortness of breath.  Patient doing very well and rested well last night.  Voicing no complaints today  Objective: Vital signs in last 24 hours: Temp:  [97.5 F (36.4 C)-98.8 F (37.1 C)] 97.7 F (36.5 C) (10/29 0515) Pulse Rate:  [52-75] 52 (10/29 0515) Resp:  [12-24] 16 (10/29 0515) BP: (103-186)/(63-91) 123/84 (10/29 0515) SpO2:  [95 %-98 %] 95 % (10/29 0515) Heels are non tender and elevated off the bed using rolled towels along with bone foam under operative heel  Intake/Output from previous day: 10/28 0701 - 10/29 0700 In: 3125 [I.V.:2425; IV Piggyback:700] Out: 3180 [Urine:2800; Drains:330; Blood:50] Intake/Output this shift: No intake/output data recorded.  No results for input(s): HGB in the last 72 hours. No results for input(s): WBC, RBC, HCT, PLT in the last 72 hours. No results for input(s): NA, K, CL, CO2, BUN, CREATININE, GLUCOSE, CALCIUM in the last 72 hours. No results for input(s): LABPT, INR in the last 72 hours.  EXAM General - Patient is Alert, Appropriate and Oriented Extremity - Neurologically intact Neurovascular intact Sensation intact distally Intact pulses distally Dorsiflexion/Plantar flexion intact Compartment soft Dressing - dressing C/D/I Motor Function - intact, moving foot and toes well on exam.  Able to do straight leg raise and flex and is doing well  Past Medical History:  Diagnosis Date  . Arthritis   . Complication of anesthesia    slow to wake up after colonoscopy  . GERD (gastroesophageal reflux disease)   . Headache   . History  of chicken pox   . History of kidney stones   . Prostate enlargement   . Tremors of nervous system     Assessment/Plan: 1 Day Post-Op Procedure(s) (LRB): COMPUTER ASSISTED TOTAL KNEE ARTHROPLASTY (Left) Active Problems:   S/P total knee arthroplasty  Estimated body mass index is 33.15 kg/m as calculated from the following:   Height as of 05/25/18: 5\' 10"  (1.778 m).   Weight as of 05/25/18: 104.8 kg. Advance diet Up with therapy D/C IV fluids Plan for discharge tomorrow Discharge home with home health  Labs: None DVT Prophylaxis - Lovenox, Foot Pumps and TED hose Weight-Bearing as tolerated to left leg D/C O2 and Pulse OX and try on Room Air Begin working on bowel movement  Lyana Asbill R. [place] 06/06/2018, 7:39 AM

## 2018-06-06 NOTE — Anesthesia Postprocedure Evaluation (Signed)
Anesthesia Post Note  Patient: Rodney Rojas  Procedure(s) Performed: COMPUTER ASSISTED TOTAL KNEE ARTHROPLASTY (Left )  Patient location during evaluation: Nursing Unit Anesthesia Type: Spinal Pain management: pain level controlled Vital Signs Assessment: post-procedure vital signs reviewed and stable Respiratory status: spontaneous breathing and nonlabored ventilation Cardiovascular status: stable Postop Assessment: no headache, no backache, patient able to bend at knees, no apparent nausea or vomiting, adequate PO intake and able to ambulate Anesthetic complications: no     Last Vitals:  Vitals:   06/06/18 0515 06/06/18 0756  BP: 123/84 (!) 150/95  Pulse: (!) 52 (!) 51  Resp: 16 18  Temp: 36.5 C 36.6 C  SpO2: 95% 100%    Last Pain:  Vitals:   06/06/18 0836  TempSrc:   PainSc: 4                  Gennaro Lizotte,  Jizel Cheeks R

## 2018-06-07 NOTE — Progress Notes (Addendum)
   Subjective: 2 Days Post-Op Procedure(s) (LRB): COMPUTER ASSISTED TOTAL KNEE ARTHROPLASTY (Left) Patient reports pain as mild.   Patient is well, and has had no acute complaints or problems Did well with physical therapy.  Ambulated greater than 200 feet twice yesterday.  Range of motion 0 to 90 degrees Plan is to go Home after hospital stay. no nausea and no vomiting Patient denies any chest pains or shortness of breath. Patient voicing no complaints.  States that he slept all night  Objective: Vital signs in last 24 hours: Temp:  [97.8 F (36.6 C)-98 F (36.7 C)] 97.8 F (36.6 C) (10/30 0003) Pulse Rate:  [51-66] 66 (10/30 0003) Resp:  [18] 18 (10/30 0003) BP: (136-162)/(91-95) 162/91 (10/30 0003) SpO2:  [96 %-100 %] 96 % (10/30 0003) well approximated incision Heels are non tender and elevated off the bed using rolled towels Intake/Output from previous day: 10/29 0701 - 10/30 0700 In: 240 [P.O.:240] Out: 740 [Urine:500; Drains:240] Intake/Output this shift: Total I/O In: -  Out: 740 [Urine:500; Drains:240]  No results for input(s): HGB in the last 72 hours. No results for input(s): WBC, RBC, HCT, PLT in the last 72 hours. No results for input(s): NA, K, CL, CO2, BUN, CREATININE, GLUCOSE, CALCIUM in the last 72 hours. No results for input(s): LABPT, INR in the last 72 hours.  EXAM General - Patient is Alert, Appropriate and Oriented Extremity - Neurologically intact Neurovascular intact Sensation intact distally Intact pulses distally Dorsiflexion/Plantar flexion intact No cellulitis present Compartment soft Dressing - dressing C/D/I Motor Function - intact, moving foot and toes well on exam.    Past Medical History:  Diagnosis Date  . Arthritis   . Complication of anesthesia    slow to wake up after colonoscopy  . GERD (gastroesophageal reflux disease)   . Headache   . History of chicken pox   . History of kidney stones   . Prostate enlargement   .  Tremors of nervous system     Assessment/Plan: 2 Days Post-Op Procedure(s) (LRB): COMPUTER ASSISTED TOTAL KNEE ARTHROPLASTY (Left) Active Problems:   S/P total knee arthroplasty  Estimated body mass index is 33.15 kg/m as calculated from the following:   Height as of 05/25/18: 5\' 10"  (1.778 m).   Weight as of 05/25/18: 104.8 kg. Up with therapy Discharge home with home health  Labs: None DVT Prophylaxis - Lovenox, Foot Pumps and TED hose Weight-Bearing as tolerated to left leg Patient may be discharged home once he has a bowel movement. Please wash operative leg, change dressing and apply TED stockings to both legs prior to discharge Please give the patient 2 extra honeycomb dressings to take home Hemovac was discontinued today.  Ends of the drain appeared to be intact but there was some resistance noted with removal of the drains but no stoppage.  Jillyn Ledger. [place] 06/07/2018, 6:59 AM

## 2018-06-07 NOTE — Progress Notes (Signed)
Dressing changed to [place] dressing, no honeycomb in stock. Applied thigh TED per order. Discharge AVS reviewed with verbal understanding. Escorted to personal vehicle by ortho staff

## 2018-06-07 NOTE — Care Management Note (Signed)
Case Management Note  Patient Details  Name: Rodney Rojas MRN: [id number] Date of Birth: 09/23/1960  Subjective/Objective:  Discharging today                  Action/Plan: [facility] made aware of discharge.   Expected Discharge Date:  06/07/18               Expected Discharge Plan:  [place]  In-House Referral:     Discharge planning Services  CM Consult  Post Acute Care Choice:  Home Health Choice offered to:  Patient, Spouse  DME Arranged:    DME Agency:     HH Arranged:  PT [place]:  [facility] (formerly [facility])  Status of Service:  In process, will continue to follow  If discussed at Long Length of Stay Meetings, dates discussed:    Additional Comments:  Jolly Mango, RN 06/07/2018, 8:43 AM

## 2018-06-07 NOTE — Progress Notes (Signed)
Physical Therapy Treatment Patient Details Name: Rodney Rojas MRN: [id number] DOB: April 06, 1961 Today's Date: 06/07/2018    History of Present Illness Pt is a 57 y.o male s/p elective L TKA 06/05/18. PMH significant for atrthritis, GERD, umbillical hernia repair, and R TKA.     PT Comments    Pt sitting edge of bed upon arrival ready to work with PT. Pt having increased pain due to no pain medication last night however still able to tolerate active exercises and 200 feet of ambulation. Pt ambulating slower than anticipated however continues to perform ambulation with step through gait, smooth rolling of RW, requires minimal VC to take equal steps. Pt having decreased pain with ambulation and notified RN of increased pain levels. Pt has met all goals during admission and remains appropriate to return home with surgeon recommendation for therapy.   Follow Up Recommendations  Follow surgeon's recommendation for DC plan and follow-up therapies     Equipment Recommendations       Recommendations for Other Services       Precautions / Restrictions Precautions Precautions: Knee;Fall Precaution Booklet Issued: Yes (comment) Restrictions Weight Bearing Restrictions: Yes LLE Weight Bearing: Weight bearing as tolerated    Mobility  Bed Mobility               General bed mobility comments: Pt seated edge of bed upon arrival, finished session in recliner chair  Transfers Overall transfer level: Modified independent Equipment used: Rolling walker (2 wheeled) Transfers: Sit to/from Stand Sit to Stand: Modified independent (Device/Increase time)         General transfer comment: No physical assist required, quick to rise from seated   Ambulation/Gait Ambulation/Gait assistance: Supervision Gait Distance (Feet): 200 Feet Assistive device: Rolling walker (2 wheeled)       General Gait Details: Pt ambulating with slower gait speed compared to yesterday due to increased  pain. Continues to have step through gait but requires VC for equal steps B, continuous rolling of RW, and negligible limp   Stairs             Wheelchair Mobility    Modified Rankin (Stroke Patients Only)       Balance Overall balance assessment: Needs assistance Sitting-balance support: Feet unsupported;No upper extremity supported Sitting balance-Leahy Scale: Good     Standing balance support: Single extremity supported;During functional activity Standing balance-Leahy Scale: Poor                              Cognition Arousal/Alertness: Awake/alert Behavior During Therapy: WFL for tasks assessed/performed Overall Cognitive Status: Within Functional Limits for tasks assessed                                        Exercises Total Joint Exercises Straight Leg Raises: AROM;Left;10 reps;Supine Long Arc Quad: AROM;Left;10 reps;Seated Knee Flexion: AROM;Left;10 reps;Seated Goniometric ROM: L knee: 0-95    General Comments        Pertinent Vitals/Pain Pain Score: 7  Pain Location: L knee Pain Intervention(s): Monitored during session;Patient requesting pain meds-RN notified;Ice applied;Repositioned    Home Living                      Prior Function            PT Goals (current goals can now be found in  the care plan section) Progress towards PT goals: Progressing toward goals    Frequency    BID      PT Plan Current plan remains appropriate    Co-evaluation              AM-PAC PT "6 Clicks" Daily Activity  Outcome Measure  Difficulty turning over in bed (including adjusting bedclothes, sheets and blankets)?: None Difficulty moving from lying on back to sitting on the side of the bed? : None Difficulty sitting down on and standing up from a chair with arms (e.g., wheelchair, bedside commode, etc,.)?: None Help needed moving to and from a bed to chair (including a wheelchair)?: A Little Help needed  walking in hospital room?: A Little Help needed climbing 3-5 steps with a railing? : A Little 6 Click Score: 21    End of Session Equipment Utilized During Treatment: Gait belt Activity Tolerance: Patient tolerated treatment well Patient left: with call bell/phone within reach;with SCD's reapplied;with family/visitor present;in chair;with chair alarm set(B heels elevated with towel roll) Nurse Communication: Mobility status PT Visit Diagnosis: Pain;Muscle weakness (generalized) (M62.81);Difficulty in walking, not elsewhere classified (R26.2);Other abnormalities of gait and mobility (R26.89) Pain - Right/Left: Left Pain - part of body: Knee     Time: 2947-6546 PT Time Calculation (min) (ACUTE ONLY): 23 min  Charges:                        Ernie Avena, SPT 06/07/2018, 10:43 AM

## 2018-06-08 DIAGNOSIS — G8929 Other chronic pain: Secondary | ICD-10-CM | POA: Diagnosis not present

## 2018-06-08 DIAGNOSIS — Z471 Aftercare following joint replacement surgery: Secondary | ICD-10-CM | POA: Diagnosis not present

## 2018-06-08 DIAGNOSIS — G250 Essential tremor: Secondary | ICD-10-CM | POA: Diagnosis not present

## 2018-06-08 DIAGNOSIS — Z0000 Encounter for general adult medical examination without abnormal findings: Secondary | ICD-10-CM | POA: Diagnosis not present

## 2018-06-08 DIAGNOSIS — Z9181 History of falling: Secondary | ICD-10-CM | POA: Diagnosis not present

## 2018-06-08 DIAGNOSIS — Z96653 Presence of artificial knee joint, bilateral: Secondary | ICD-10-CM | POA: Diagnosis not present

## 2018-06-09 ENCOUNTER — Other Ambulatory Visit: Payer: Self-pay

## 2018-06-09 ENCOUNTER — Emergency Department
Admission: EM | Admit: 2018-06-09 | Discharge: 2018-06-09 | Disposition: A | Discharge status: Home / Self Care | Payer: BLUE CROSS/BLUE SHIELD | Attending: Emergency Medicine | Admitting: Emergency Medicine

## 2018-06-09 ENCOUNTER — Emergency Department: Payer: BLUE CROSS/BLUE SHIELD

## 2018-06-09 ENCOUNTER — Encounter: Payer: Self-pay | Admitting: Emergency Medicine

## 2018-06-09 DIAGNOSIS — R0602 Shortness of breath: Secondary | ICD-10-CM | POA: Insufficient documentation

## 2018-06-09 DIAGNOSIS — Z9181 History of falling: Secondary | ICD-10-CM | POA: Diagnosis not present

## 2018-06-09 DIAGNOSIS — R5381 Other malaise: Secondary | ICD-10-CM | POA: Diagnosis not present

## 2018-06-09 DIAGNOSIS — Z96653 Presence of artificial knee joint, bilateral: Secondary | ICD-10-CM | POA: Insufficient documentation

## 2018-06-09 DIAGNOSIS — Z79899 Other long term (current) drug therapy: Secondary | ICD-10-CM | POA: Insufficient documentation

## 2018-06-09 DIAGNOSIS — R4182 Altered mental status, unspecified: Secondary | ICD-10-CM | POA: Diagnosis not present

## 2018-06-09 DIAGNOSIS — G2571 Drug induced akathisia: Secondary | ICD-10-CM | POA: Diagnosis not present

## 2018-06-09 DIAGNOSIS — G8929 Other chronic pain: Secondary | ICD-10-CM | POA: Diagnosis not present

## 2018-06-09 DIAGNOSIS — R42 Dizziness and giddiness: Secondary | ICD-10-CM | POA: Diagnosis not present

## 2018-06-09 DIAGNOSIS — G250 Essential tremor: Secondary | ICD-10-CM | POA: Diagnosis not present

## 2018-06-09 DIAGNOSIS — R918 Other nonspecific abnormal finding of lung field: Secondary | ICD-10-CM | POA: Diagnosis not present

## 2018-06-09 DIAGNOSIS — Z471 Aftercare following joint replacement surgery: Secondary | ICD-10-CM | POA: Diagnosis not present

## 2018-06-09 LAB — CBC WITH DIFFERENTIAL/PLATELET
Abs Immature Granulocytes: 0.06 10*3/uL (ref 0.00–0.07)
BASOS PCT: 0 %
Basophils Absolute: 0 10*3/uL (ref 0.0–0.1)
EOS ABS: 0.1 10*3/uL (ref 0.0–0.5)
Eosinophils Relative: 1 %
HCT: 38.6 % — ABNORMAL LOW (ref 39.0–52.0)
Hemoglobin: 12.5 g/dL — ABNORMAL LOW (ref 13.0–17.0)
Immature Granulocytes: 1 %
Lymphocytes Relative: 19 %
Lymphs Abs: 1.9 10*3/uL (ref 0.7–4.0)
MCH: 30.3 pg (ref 26.0–34.0)
MCHC: 32.4 g/dL (ref 30.0–36.0)
MCV: 93.7 fL (ref 80.0–100.0)
MONO ABS: 0.7 10*3/uL (ref 0.1–1.0)
MONOS PCT: 7 %
NEUTROS ABS: 7.5 10*3/uL (ref 1.7–7.7)
NEUTROS PCT: 72 %
PLATELETS: 220 10*3/uL (ref 150–400)
RBC: 4.12 MIL/uL — AB (ref 4.22–5.81)
RDW: 12.5 % (ref 11.5–15.5)
WBC: 10.3 10*3/uL (ref 4.0–10.5)
nRBC: 0 % (ref 0.0–0.2)

## 2018-06-09 LAB — COMPREHENSIVE METABOLIC PANEL
ALK PHOS: 39 U/L (ref 38–126)
ALT: 36 U/L (ref 0–44)
ANION GAP: 7 (ref 5–15)
AST: 43 U/L — ABNORMAL HIGH (ref 15–41)
Albumin: 3.9 g/dL (ref 3.5–5.0)
BILIRUBIN TOTAL: 0.8 mg/dL (ref 0.3–1.2)
BUN: 10 mg/dL (ref 6–20)
CALCIUM: 8.7 mg/dL — AB (ref 8.9–10.3)
CO2: 28 mmol/L (ref 22–32)
CREATININE: 0.72 mg/dL (ref 0.61–1.24)
Chloride: 102 mmol/L (ref 98–111)
GFR calc Af Amer: >60 mL/min (ref >60)
GFR calc non Af Amer: >60 mL/min (ref >60)
Glucose, Bld: 112 mg/dL — ABNORMAL HIGH (ref 70–99)
Potassium: 4.3 mmol/L (ref 3.5–5.1)
Sodium: 137 mmol/L (ref 135–145)
Total Protein: 6.8 g/dL (ref 6.5–8.1)

## 2018-06-09 LAB — BRAIN NATRIURETIC PEPTIDE: B NATRIURETIC PEPTIDE 5: 24 pg/mL (ref 0.0–100.0)

## 2018-06-09 LAB — TROPONIN I

## 2018-06-09 LAB — LACTIC ACID, PLASMA: LACTIC ACID, VENOUS: 1.4 mmol/L (ref 0.5–1.9)

## 2018-06-09 MED ORDER — IOPAMIDOL (ISOVUE-370) INJECTION 76%
75.0000 mL | Freq: Once | INTRAVENOUS | Status: AC | PRN
  Administered 2018-06-09: 75 mL via INTRAVENOUS

## 2018-06-09 MED ORDER — SODIUM CHLORIDE 0.9 % IV BOLUS
1000.0000 mL | Freq: Once | INTRAVENOUS | Status: AC
  Administered 2018-06-09: 1000 mL via INTRAVENOUS

## 2018-06-09 MED ORDER — DIPHENHYDRAMINE HCL 50 MG/ML IJ SOLN
50.0000 mg | Freq: Once | INTRAMUSCULAR | Status: AC
  Administered 2018-06-09: 50 mg via INTRAVENOUS
  Filled 2018-06-09: qty 1

## 2018-06-09 NOTE — ED Triage Notes (Signed)
Pt to triage via w/c, appears uncomfortable; st had left TKR on Monday (Dr Marry Guan) and since yesterday has not felt good--dizzy, anxious, palpitations; denies hx of same; st has rx for oxycodone and tramadol, has only been taking tramadol and unsure if this is the cause

## 2018-06-09 NOTE — ED Provider Notes (Signed)
[facility] Emergency Department Provider Note  ____________________________________________   First MD Initiated Contact with Patient 06/09/18 0410     (approximate)  I have reviewed the triage vital signs and the nursing notes.   HISTORY  Chief Complaint Dizziness   HPI Rodney Rojas is a 57 y.o. male who self presents to the emergency department with 6 to 8 hours of generalized malaise.  It is difficult for him to fully describe his symptoms but he describes it as restlessness and feeling like he has to move.  He feels uncomfortable and somewhat short of breath.  3 days ago he had a total knee replacement performed by Dr. Marry Guan and has done well postoperatively until today.  He is taking Lovenox daily and will continue taking it for a total of 14 days.  He is taken 3 doses of tramadol which she has never taken before postoperatively.  He has not taken any of his oxycodone.  He has no history of DVT or pulmonary embolism.  He has no chest pain.  He describes his symptoms as "uncomfortable".  The came on suddenly seem to be constant and nothing in particular that he can identify makes them better or worse.  He denies chest pain.  Symptoms are not ripping or tearing does not go to his back.   Is also had about 2 days of gradual onset not maximal onset bifrontal throbbing headache similar to previous headaches he had in the past.   Past Medical History:  Diagnosis Date  . Arthritis   . Complication of anesthesia    slow to wake up after colonoscopy  . GERD (gastroesophageal reflux disease)   . Headache   . History of chicken pox   . History of kidney stones   . Prostate enlargement   . Tremors of nervous system     Patient Active Problem List   Diagnosis Date Noted  . S/P total knee arthroplasty 06/05/2018  . Primary osteoarthritis of left knee 07/21/2017  . Status post total right knee replacement 02/25/2016  . Chronic pain of left knee 02/24/2015    . GERD (gastroesophageal reflux disease) 02/24/2015  . Urinary hesitancy 02/24/2015  . Fam hx-ischem heart disease 09/01/2009  . Family history of prostate cancer 09/01/2009  . Umbilical hernia without obstruction and without gangrene 09/01/2009  . Benign essential tremor 01/14/2009  . Allergy to insects and arachnids 08/09/1998    Past Surgical History:  Procedure Laterality Date  . CARDIOVASCULAR STRESS TEST  12/28/2013   Normal; [facility]  . CATARACT EXTRACTION W/PHACO Left 10/11/2017   Procedure: CATARACT EXTRACTION PHACO AND INTRAOCULAR LENS PLACEMENT (IOC);  Surgeon: Birder Robson, MD;  Location: [facility] ORS;  Service: Ophthalmology;  Laterality: Left;  [place] 00:23.0 AP% 13.1 CDE 3.02 Fluid Pack Lot # [id number]  . CYSTOSCOPY  1994  . HERNIA REPAIR  78/29/5621   umbilical hernia repair; Dr. Pat Patrick  . KNEE ARTHROPLASTY Right 02/25/2016   Procedure: COMPUTER ASSISTED TOTAL KNEE ARTHROPLASTY;  Surgeon: Dereck Leep, MD;  Location: [facility] ORS;  Service: Orthopedics;  Laterality: Right;  . KNEE ARTHROPLASTY Left 06/05/2018   Procedure: COMPUTER ASSISTED TOTAL KNEE ARTHROPLASTY;  Surgeon: Dereck Leep, MD;  Location: [facility] ORS;  Service: Orthopedics;  Laterality: Left;  . REPLACEMENT TOTAL KNEE Right   . VASECTOMY  2000    Prior to Admission medications   Medication Sig Start Date End Date Taking? Authorizing Provider  bisoprolol (ZEBETA) 10 MG tablet TAKE 1 TABLET BY MOUTH  ONCE DAILY 05/27/18   Birdie Sons, MD  enoxaparin (LOVENOX) 40 MG/0.4ML injection Inject 0.4 mLs (40 mg total) into the skin daily for 14 days. 06/08/18 06/22/18  Watt Climes, PA  EPIPEN 2-PAK 0.3 MG/0.3ML SOAJ injection use as directed by prescriber Patient taking differently: Inject 0.3 mg into the muscle once.  02/07/17   Birdie Sons, MD  Glucosamine HCl-MSM (GLUCOSAMINE-MSM PO) Take 2 tablets by mouth daily.     [provider]  meloxicam (MOBIC) 15 MG tablet Take 15 mg by mouth daily.    [provider]  oxyCODONE (OXY IR/ROXICODONE) 5 MG immediate release tablet Take 1 tablet (5 mg total) by mouth every 4 (four) hours as needed for moderate pain (pain score 4-6). 06/06/18   Watt Climes, PA  primidone (MYSOLINE) 50 MG tablet TAKE 1 TO 2 TABLETS BY MOUTH AT BEDTIME IF NEEDED Patient taking differently: Take 50 mg by mouth daily.  03/04/18   Birdie Sons, MD  tamsulosin (FLOMAX) 0.4 MG CAPS capsule Take 0.4 mg by mouth daily as needed (urination).  06/24/15   [provider]  TESTOSTERONE IL 1 application by Implant route every 6 (six) months.    [provider]  traMADol (ULTRAM) 50 MG tablet Take 1-2 tablets (50-100 mg total) by mouth every 4 (four) hours as needed for moderate pain. 06/06/18   Watt Climes, PA    Allergies Bee venom  Family History  Problem Relation Age of Onset  . Heart Problems Father        had 3V CABG in his 54's  . Diabetes Father   . Hypertension Father   . Cirrhosis Father     Social History Social History   Tobacco Use  . Smoking status: Never Smoker  . Smokeless tobacco: Never Used  Substance Use Topics  . Alcohol use: Yes    Alcohol/week: 1.0 standard drinks    Types: 1 Cans of beer per week    Comment: occasional use  . Drug use: No    Review of Systems Constitutional: No fever/chills Eyes: No visual changes. ENT: No sore throat. Cardiovascular: Denies chest pain. Respiratory: Positive for shortness of breath. Gastrointestinal: No abdominal pain.  No nausea, no vomiting.  No diarrhea.  No constipation. Genitourinary: Negative for dysuria. Musculoskeletal: Negative for back pain. Skin: Negative for rash. Neurological: Positive for headache   ____________________________________________   PHYSICAL EXAM:  VITAL SIGNS: ED Triage Vitals  Enc Vitals Group     BP --      Pulse --      Resp --      Temp --      Temp src --      SpO2 --      Weight 06/09/18 0401 215 lb (97.5 kg)     Height  06/09/18 0401 5\' 10"  (1.778 m)     Head Circumference --      Peak Flow --      Pain Score 06/09/18 0400 3     Pain Loc --      Pain Edu? --      Excl. in [place]? --     Constitutional: Alert and oriented x4 appears somewhat uncomfortable appearing and mildly diaphoretic  Eyes: PERRL EOMI. midrange and brisk Head: Atraumatic. Nose: No congestion/rhinnorhea. Mouth/Throat: No trismus Neck: No stridor.   Cardiovascular: Normal rate, regular rhythm. Grossly normal heart sounds.  Good peripheral circulation. Respiratory: Slightly increased respiratory effort.  No retractions. Lungs  CTAB and moving good air Gastrointestinal: Soft nontender Musculoskeletal: No lower extremity edema   Neurologic:  Normal speech and language.  No ankle clonus bilaterally.  1+ DTRs bilaterally Skin: Mildly diaphoretic Psychiatric: Anxious and somewhat uncomfortable appearing.  Is not sitting still in bed and somewhat fidgeting    ____________________________________________   DIFFERENTIAL includes but not limited to  Aortic dissection, pulmonary embolism, serotonin toxicity, akinesthesia ____________________________________________   LABS (all labs ordered are listed, but only abnormal results are displayed)  Labs Reviewed  COMPREHENSIVE METABOLIC PANEL - Abnormal; Notable for the following components:      Result Value   Glucose, Bld 112 (*)    Calcium 8.7 (*)    AST 43 (*)    All other components within normal limits  CBC WITH DIFFERENTIAL/PLATELET - Abnormal; Notable for the following components:   RBC 4.12 (*)    Hemoglobin 12.5 (*)    HCT 38.6 (*)    All other components within normal limits  BRAIN NATRIURETIC PEPTIDE  TROPONIN I  LACTIC ACID, PLASMA  URINALYSIS, COMPLETE (UACMP) WITH MICROSCOPIC    Lab work reviewed by me with no acute disease __________________________________________  EKG  ED ECG REPORT I, Darel Hong, the attending physician, personally viewed and interpreted  this ECG.  Date: 06/09/2018 EKG Time:  Rate: 62 Rhythm: normal sinus rhythm QRS Axis: normal Intervals: normal ST/T Wave abnormalities: normal Narrative Interpretation: no evidence of acute ischemia  ____________________________________________  RADIOLOGY   CT angiogram of the chest reviewed by me with suboptimal bolus timing but no large pulmonary embolism identified CT scan of the head reviewed by me with no acute disease ____________________________________________   PROCEDURES  Procedure(s) performed: no  Procedures  Critical Care performed: no  ____________________________________________   INITIAL IMPRESSION / ASSESSMENT AND PLAN / ED COURSE  Pertinent labs & imaging results that were available during my care of the patient were reviewed by me and considered in my medical decision making (see chart for details).   As part of my medical decision making, I reviewed the following data within the [place] History obtained from family if available, nursing notes, old chart and ekg, as well as notes from prior ED visits.  Patient comes to the emergency department with what sounds like akinesthesia.  He is a immediate postoperative period and I am concerned about pulmonary embolism even though he is compliant with his Lovenox.  He is not taking any over-the-counter medications and he is not taking Reglan or Compazine or anything else that should commonly cause akinesthesia.  Tramadol certainly could cause hyperreflexia and an uncomfortable feeling and some people are rapid cycler is and respond over the top to tramadol.  I will give him 50 mg of IV Benadryl while CT scans and lab work are pending.    ----------------------------------------- 6:55 AM on 06/09/2018 -----------------------------------------  After Benadryl the patient's symptoms are nearly completely resolved.  His CT scan was not optimally bolused however is negative for central pulmonary  embolism and the patient has been taking and continues to take Lovenox.  Had a lengthy discussion with the patient and his wife at bedside regarding the diagnostic uncertainty.  I offered him inpatient admission for observation and evaluation of near syncope and he has malaise however they declined stating he would prefer to go home which I think is reasonable.  Of advised him not to take any tramadol and to use Benadryl 50 mg up to 4 times a day as needed for akinesthesia.  I also recommend that he follow-up on Monday with his primary care physician.  Strict return precautions have been given the patient verbalizes understanding and agree with the plan.  ____________________________________________   FINAL CLINICAL IMPRESSION(S) / ED DIAGNOSES  Final diagnoses:  Akathisia      NEW MEDICATIONS STARTED DURING THIS VISIT:  Discharge Medication List as of 06/09/2018  6:55 AM       Note:  This document was prepared using Dragon voice recognition software and may include unintentional dictation errors.     Darel Hong, MD 06/09/18 [phone]

## 2018-06-09 NOTE — Discharge Instructions (Signed)
Fortunately today your CT scan of your head, your chest, your blood work, your EKG were reassuring.  I do not have a clear reason why you felt so uncomfortable although it is possible it could be related to tramadol so I recommend you do not take this medication anymore.  Please take 50 mg of Benadryl up to 4 times a day as needed for the similar symptoms.  Follow-up with your primary care physician this coming Monday for recheck and return to the emergency department sooner for any concerns whatsoever.  It was a pleasure to take care of you today, and thank you for coming to our emergency department.  If you have any questions or concerns before leaving please ask the nurse to grab me and I'm more than happy to go through your aftercare instructions again.  If you were prescribed any opioid pain medication today such as Norco, Vicodin, Percocet, morphine, hydrocodone, or oxycodone please make sure you do not drive when you are taking this medication as it can alter your ability to drive safely.  If you have any concerns once you are home that you are not improving or are in fact getting worse before you can make it to your follow-up appointment, please do not hesitate to call 911 and come back for further evaluation.  Darel Hong, MD  Results for orders placed or performed during the hospital encounter of 06/09/18  Comprehensive metabolic panel  Result Value Ref Range   Sodium 137 135 - 145 mmol/L   Potassium 4.3 3.5 - 5.1 mmol/L   Chloride 102 98 - 111 mmol/L   CO2 28 22 - 32 mmol/L   Glucose, Bld 112 (H) 70 - 99 mg/dL   BUN 10 6 - 20 mg/dL   Creatinine, Ser 0.72 0.61 - 1.24 mg/dL   Calcium 8.7 (L) 8.9 - 10.3 mg/dL   Total Protein 6.8 6.5 - 8.1 g/dL   Albumin 3.9 3.5 - 5.0 g/dL   AST 43 (H) 15 - 41 U/L   ALT 36 0 - 44 U/L   Alkaline Phosphatase 39 38 - 126 U/L   Total Bilirubin 0.8 0.3 - 1.2 mg/dL   GFR calc non Af Amer >60 >60 mL/min   GFR calc Af Amer >60 >60 mL/min   Anion gap  7 5 - 15  Brain natriuretic peptide  Result Value Ref Range   B Natriuretic Peptide 24.0 0.0 - 100.0 pg/mL  Troponin I  Result Value Ref Range   Troponin I <0.03 <0.03 ng/mL  CBC with Differential  Result Value Ref Range   WBC 10.3 4.0 - 10.5 K/uL   RBC 4.12 (L) 4.22 - 5.81 MIL/uL   Hemoglobin 12.5 (L) 13.0 - 17.0 g/dL   HCT 38.6 (L) 39.0 - 52.0 %   MCV 93.7 80.0 - 100.0 fL   MCH 30.3 26.0 - 34.0 pg   MCHC 32.4 30.0 - 36.0 g/dL   RDW 12.5 11.5 - 15.5 %   Platelets 220 150 - 400 K/uL   nRBC 0.0 0.0 - 0.2 %   Neutrophils Relative % 72 %   Neutro Abs 7.5 1.7 - 7.7 K/uL   Lymphocytes Relative 19 %   Lymphs Abs 1.9 0.7 - 4.0 K/uL   Monocytes Relative 7 %   Monocytes Absolute 0.7 0.1 - 1.0 K/uL   Eosinophils Relative 1 %   Eosinophils Absolute 0.1 0.0 - 0.5 K/uL   Basophils Relative 0 %   Basophils Absolute 0.0 0.0 - 0.1  K/uL   Immature Granulocytes 1 %   Abs Immature Granulocytes 0.06 0.00 - 0.07 K/uL  Lactic acid, plasma  Result Value Ref Range   Lactic Acid, Venous 1.4 0.5 - 1.9 mmol/L   Ct Head Wo Contrast  Result Date: 06/09/2018 CLINICAL DATA:  57 year old male with altered mental status. EXAM: CT HEAD WITHOUT CONTRAST TECHNIQUE: Contiguous axial images were obtained from the base of the skull through the vertex without intravenous contrast. COMPARISON:  None. FINDINGS: Brain: The ventricles and sulci appropriate size for patient's age. The great white matter discrimination is preserved. There is no acute intracranial hemorrhage. No mass effect or midline shift. No extra-axial fluid collection. Vascular: No hyperdense vessel or unexpected calcification. Skull: Normal. Negative for fracture or focal lesion. Sinuses/Orbits: No acute finding. Other: None IMPRESSION: No acute intracranial pathology. Electronically Signed   By: Anner Crete M.D.   On: 06/09/2018 06:24   Ct Angio Chest Pe W/cm &/or Wo Cm  Result Date: 06/09/2018 CLINICAL DATA:  57 year old male with concern for  pulmonary embolism. EXAM: CT ANGIOGRAPHY CHEST WITH CONTRAST TECHNIQUE: Multidetector CT imaging of the chest was performed using the standard protocol during bolus administration of intravenous contrast. Multiplanar CT image reconstructions and MIPs were obtained to evaluate the vascular anatomy. CONTRAST:  69mL ISOVUE-370 IOPAMIDOL (ISOVUE-370) INJECTION 76% COMPARISON:  Chest radiograph dated 12/04/2013 FINDINGS: Cardiovascular: There is mild cardiomegaly. No pericardial effusion. The thoracic aorta is unremarkable. Evaluation of the pulmonary arteries is limited due to suboptimal opacification and timing of the contrast. No definite large or central pulmonary artery embolus identified. Mediastinum/Nodes: No hilar or mediastinal adenopathy. Esophagus is grossly unremarkable. No mediastinal fluid collection. Lungs/Pleura: The lungs are clear. There is no pleural effusion or pneumothorax. The central airways are patent. Upper Abdomen: No acute abnormality. Musculoskeletal: No chest wall abnormality. No acute or significant osseous findings. Review of the MIP images confirms the above findings. IMPRESSION: No acute intrathoracic pathology. No CT evidence of central pulmonary artery embolus. Electronically Signed   By: Anner Crete M.D.   On: 06/09/2018 06:34   Dg Knee Left Port  Result Date: 06/05/2018 CLINICAL DATA:  Postop knee replacement. EXAM: PORTABLE LEFT KNEE - 1-2 VIEW COMPARISON:  None. FINDINGS: AP and lateral views of the left knee status post total knee arthroplasty with postop drainage catheters in the suprapatellar compartment and overlying skin staples in place. Expected soft tissue emphysema from recent surgery is noted. No immediate postoperative complications. Ghost tracks in the distal femoral and proximal tibial diaphysis are identified from prior intervention. Overlying skin staples are in place. IMPRESSION: No immediate complications status post left knee arthroplasty. Electronically  Signed   By: Ashley Royalty M.D.   On: 06/05/2018 15:40

## 2018-06-12 DIAGNOSIS — Z96653 Presence of artificial knee joint, bilateral: Secondary | ICD-10-CM | POA: Diagnosis not present

## 2018-06-12 DIAGNOSIS — Z471 Aftercare following joint replacement surgery: Secondary | ICD-10-CM | POA: Diagnosis not present

## 2018-06-12 DIAGNOSIS — G8929 Other chronic pain: Secondary | ICD-10-CM | POA: Diagnosis not present

## 2018-06-12 DIAGNOSIS — Z9181 History of falling: Secondary | ICD-10-CM | POA: Diagnosis not present

## 2018-06-12 DIAGNOSIS — G250 Essential tremor: Secondary | ICD-10-CM | POA: Diagnosis not present

## 2018-06-13 DIAGNOSIS — G8929 Other chronic pain: Secondary | ICD-10-CM | POA: Diagnosis not present

## 2018-06-13 DIAGNOSIS — Z9181 History of falling: Secondary | ICD-10-CM | POA: Diagnosis not present

## 2018-06-13 DIAGNOSIS — Z471 Aftercare following joint replacement surgery: Secondary | ICD-10-CM | POA: Diagnosis not present

## 2018-06-13 DIAGNOSIS — Z96653 Presence of artificial knee joint, bilateral: Secondary | ICD-10-CM | POA: Diagnosis not present

## 2018-06-13 DIAGNOSIS — G250 Essential tremor: Secondary | ICD-10-CM | POA: Diagnosis not present

## 2018-06-14 DIAGNOSIS — Z471 Aftercare following joint replacement surgery: Secondary | ICD-10-CM | POA: Diagnosis not present

## 2018-06-14 DIAGNOSIS — Z96653 Presence of artificial knee joint, bilateral: Secondary | ICD-10-CM | POA: Diagnosis not present

## 2018-06-14 DIAGNOSIS — Z9181 History of falling: Secondary | ICD-10-CM | POA: Diagnosis not present

## 2018-06-14 DIAGNOSIS — G250 Essential tremor: Secondary | ICD-10-CM | POA: Diagnosis not present

## 2018-06-14 DIAGNOSIS — G8929 Other chronic pain: Secondary | ICD-10-CM | POA: Diagnosis not present

## 2018-06-16 DIAGNOSIS — G8929 Other chronic pain: Secondary | ICD-10-CM | POA: Diagnosis not present

## 2018-06-16 DIAGNOSIS — G250 Essential tremor: Secondary | ICD-10-CM | POA: Diagnosis not present

## 2018-06-16 DIAGNOSIS — Z96653 Presence of artificial knee joint, bilateral: Secondary | ICD-10-CM | POA: Diagnosis not present

## 2018-06-16 DIAGNOSIS — Z9181 History of falling: Secondary | ICD-10-CM | POA: Diagnosis not present

## 2018-06-16 DIAGNOSIS — Z471 Aftercare following joint replacement surgery: Secondary | ICD-10-CM | POA: Diagnosis not present

## 2018-06-19 DIAGNOSIS — Z96653 Presence of artificial knee joint, bilateral: Secondary | ICD-10-CM | POA: Diagnosis not present

## 2018-06-19 DIAGNOSIS — Z9181 History of falling: Secondary | ICD-10-CM | POA: Diagnosis not present

## 2018-06-19 DIAGNOSIS — G8929 Other chronic pain: Secondary | ICD-10-CM | POA: Diagnosis not present

## 2018-06-19 DIAGNOSIS — Z471 Aftercare following joint replacement surgery: Secondary | ICD-10-CM | POA: Diagnosis not present

## 2018-06-19 DIAGNOSIS — G250 Essential tremor: Secondary | ICD-10-CM | POA: Diagnosis not present

## 2018-06-20 DIAGNOSIS — Z96652 Presence of left artificial knee joint: Secondary | ICD-10-CM | POA: Diagnosis not present

## 2018-06-23 DIAGNOSIS — Z96652 Presence of left artificial knee joint: Secondary | ICD-10-CM | POA: Diagnosis not present

## 2018-06-23 DIAGNOSIS — M25562 Pain in left knee: Secondary | ICD-10-CM | POA: Diagnosis not present

## 2018-06-26 DIAGNOSIS — Z96652 Presence of left artificial knee joint: Secondary | ICD-10-CM | POA: Diagnosis not present

## 2018-06-26 DIAGNOSIS — M25562 Pain in left knee: Secondary | ICD-10-CM | POA: Diagnosis not present

## 2018-06-27 DIAGNOSIS — Z471 Aftercare following joint replacement surgery: Secondary | ICD-10-CM | POA: Diagnosis not present

## 2018-06-28 DIAGNOSIS — Z96652 Presence of left artificial knee joint: Secondary | ICD-10-CM | POA: Diagnosis not present

## 2018-06-30 DIAGNOSIS — Z96652 Presence of left artificial knee joint: Secondary | ICD-10-CM | POA: Diagnosis not present

## 2018-07-03 DIAGNOSIS — Z96652 Presence of left artificial knee joint: Secondary | ICD-10-CM | POA: Diagnosis not present

## 2018-07-05 DIAGNOSIS — Z96652 Presence of left artificial knee joint: Secondary | ICD-10-CM | POA: Diagnosis not present

## 2018-07-10 DIAGNOSIS — Z96652 Presence of left artificial knee joint: Secondary | ICD-10-CM | POA: Diagnosis not present

## 2018-07-12 DIAGNOSIS — Z96652 Presence of left artificial knee joint: Secondary | ICD-10-CM | POA: Diagnosis not present

## 2018-07-14 DIAGNOSIS — Z96652 Presence of left artificial knee joint: Secondary | ICD-10-CM | POA: Diagnosis not present

## 2018-07-17 DIAGNOSIS — Z96652 Presence of left artificial knee joint: Secondary | ICD-10-CM | POA: Diagnosis not present

## 2018-07-18 DIAGNOSIS — M1712 Unilateral primary osteoarthritis, left knee: Secondary | ICD-10-CM | POA: Diagnosis not present

## 2018-07-19 DIAGNOSIS — Z96652 Presence of left artificial knee joint: Secondary | ICD-10-CM | POA: Diagnosis not present

## 2018-07-19 DIAGNOSIS — M25562 Pain in left knee: Secondary | ICD-10-CM | POA: Diagnosis not present

## 2018-07-21 DIAGNOSIS — Z96652 Presence of left artificial knee joint: Secondary | ICD-10-CM | POA: Diagnosis not present

## 2018-07-25 DIAGNOSIS — Z96652 Presence of left artificial knee joint: Secondary | ICD-10-CM | POA: Diagnosis not present

## 2018-07-27 DIAGNOSIS — Z96652 Presence of left artificial knee joint: Secondary | ICD-10-CM | POA: Diagnosis not present

## 2018-07-31 DIAGNOSIS — Z96652 Presence of left artificial knee joint: Secondary | ICD-10-CM | POA: Diagnosis not present

## 2018-08-03 DIAGNOSIS — Z96652 Presence of left artificial knee joint: Secondary | ICD-10-CM | POA: Diagnosis not present

## 2018-08-08 DIAGNOSIS — Z96652 Presence of left artificial knee joint: Secondary | ICD-10-CM | POA: Diagnosis not present

## 2018-08-20 ENCOUNTER — Other Ambulatory Visit: Payer: Self-pay | Admitting: Family Medicine

## 2018-08-20 DIAGNOSIS — G250 Essential tremor: Secondary | ICD-10-CM

## 2018-08-22 ENCOUNTER — Other Ambulatory Visit: Payer: Self-pay | Admitting: Family Medicine

## 2018-08-22 DIAGNOSIS — G250 Essential tremor: Secondary | ICD-10-CM

## 2018-08-22 NOTE — Telephone Encounter (Signed)
Patient has not been seen in since November 2018. He needs to schedule follow up o.v. have sent 30 day prescription to get by until his visit.

## 2018-08-23 NOTE — Telephone Encounter (Signed)
LMTCB 08/23/2018   Thanks,   -Temesha Queener  

## 2018-09-19 DIAGNOSIS — L309 Dermatitis, unspecified: Secondary | ICD-10-CM | POA: Diagnosis not present

## 2018-10-24 ENCOUNTER — Other Ambulatory Visit: Payer: Self-pay | Admitting: Family Medicine

## 2018-10-24 DIAGNOSIS — G250 Essential tremor: Secondary | ICD-10-CM

## 2018-10-24 MED ORDER — BISOPROLOL FUMARATE 10 MG PO TABS: 10.0000 mg | ORAL_TABLET | Freq: Every day | ORAL | 1 refills | Status: DC

## 2018-10-24 MED ORDER — PRIMIDONE 50 MG PO TABS: 50.0000 mg | ORAL_TABLET | Freq: Every day | ORAL | 1 refills | Status: DC

## 2018-10-24 NOTE — Telephone Encounter (Signed)
Pt cancelled appt due to just needing his Rx refills and Coronavirus.  Just needing refills on:  primidone (MYSOLINE) 50 MG tablet  bisoprolol (ZEBETA) 10 MG tablet  Please fill at:  [id number] - [address], [place] [phone] (Phone) [phone] (Fax)   Please let pt know this can be done.  Thanks, [employer]

## 2018-10-25 ENCOUNTER — Ambulatory Visit: Payer: BLUE CROSS/BLUE SHIELD | Admitting: Family Medicine

## 2018-11-21 DIAGNOSIS — M25562 Pain in left knee: Secondary | ICD-10-CM | POA: Diagnosis not present

## 2018-11-21 DIAGNOSIS — S8002XA Contusion of left knee, initial encounter: Secondary | ICD-10-CM | POA: Diagnosis not present

## 2018-11-24 ENCOUNTER — Ambulatory Visit: Payer: BLUE CROSS/BLUE SHIELD | Admitting: Family Medicine

## 2018-11-27 DIAGNOSIS — M25662 Stiffness of left knee, not elsewhere classified: Secondary | ICD-10-CM | POA: Diagnosis not present

## 2018-11-27 DIAGNOSIS — M25562 Pain in left knee: Secondary | ICD-10-CM | POA: Diagnosis not present

## 2018-11-27 DIAGNOSIS — Z96652 Presence of left artificial knee joint: Secondary | ICD-10-CM | POA: Diagnosis not present

## 2018-11-27 DIAGNOSIS — M6281 Muscle weakness (generalized): Secondary | ICD-10-CM | POA: Diagnosis not present

## 2019-01-03 DIAGNOSIS — D2261 Melanocytic nevi of right upper limb, including shoulder: Secondary | ICD-10-CM | POA: Diagnosis not present

## 2019-01-03 DIAGNOSIS — D2272 Melanocytic nevi of left lower limb, including hip: Secondary | ICD-10-CM | POA: Diagnosis not present

## 2019-01-03 DIAGNOSIS — L570 Actinic keratosis: Secondary | ICD-10-CM | POA: Diagnosis not present

## 2019-01-03 DIAGNOSIS — L820 Inflamed seborrheic keratosis: Secondary | ICD-10-CM | POA: Diagnosis not present

## 2019-01-03 DIAGNOSIS — L538 Other specified erythematous conditions: Secondary | ICD-10-CM | POA: Diagnosis not present

## 2019-01-03 DIAGNOSIS — X32XXXA Exposure to sunlight, initial encounter: Secondary | ICD-10-CM | POA: Diagnosis not present

## 2019-01-03 DIAGNOSIS — D225 Melanocytic nevi of trunk: Secondary | ICD-10-CM | POA: Diagnosis not present

## 2019-01-03 DIAGNOSIS — D2262 Melanocytic nevi of left upper limb, including shoulder: Secondary | ICD-10-CM | POA: Diagnosis not present

## 2019-01-09 ENCOUNTER — Ambulatory Visit: Payer: BLUE CROSS/BLUE SHIELD | Admitting: Family Medicine

## 2019-01-09 ENCOUNTER — Other Ambulatory Visit: Payer: Self-pay

## 2019-01-09 ENCOUNTER — Encounter: Payer: Self-pay | Admitting: Family Medicine

## 2019-01-09 VITALS — BP 158/86 | HR 54 | Temp 98.3°F | Wt 217.0 lb

## 2019-01-09 DIAGNOSIS — G250 Essential tremor: Secondary | ICD-10-CM

## 2019-01-09 DIAGNOSIS — R5383 Other fatigue: Secondary | ICD-10-CM

## 2019-01-09 DIAGNOSIS — R030 Elevated blood-pressure reading, without diagnosis of hypertension: Secondary | ICD-10-CM | POA: Diagnosis not present

## 2019-01-09 DIAGNOSIS — E291 Testicular hypofunction: Secondary | ICD-10-CM

## 2019-01-09 DIAGNOSIS — F419 Anxiety disorder, unspecified: Secondary | ICD-10-CM

## 2019-01-09 DIAGNOSIS — Z136 Encounter for screening for cardiovascular disorders: Secondary | ICD-10-CM | POA: Diagnosis not present

## 2019-01-09 DIAGNOSIS — Z125 Encounter for screening for malignant neoplasm of prostate: Secondary | ICD-10-CM

## 2019-01-09 MED ORDER — PRIMIDONE 50 MG PO TABS: ORAL_TABLET | ORAL | 3 refills | Status: DC

## 2019-01-09 MED ORDER — EPINEPHRINE 0.3 MG/0.3ML IJ SOAJ: 0.3000 mg | Freq: Once | INTRAMUSCULAR | 1 refills | Status: DC

## 2019-01-09 NOTE — Progress Notes (Signed)
Patient: Rodney Rojas Male    DOB: 09-14-1960   58 y.o.   MRN: [id number] Visit Date: 01/09/2019  Today's Provider: Lelon Huh, MD   Chief Complaint  Patient presents with  . Tremors  . Rash   Subjective:     Follow up for Benign essential tremors  The patient was last seen for this 1 years ago. Changes made at last visit include no changes, but he mention a possibly of increased primidone from one tablet at night to two tablets at night.  Pt states he has not started that yet.  He reports excellent compliance with treatment. He feels that condition is Unchanged. He is not having side effects.   He does state he has been increasingly fatigued, but has also been having some anxiety episodes. Is sleeping well. Admits he is told that he snores loudly, but feels refreshed in the morning and does not doze off during the day.  ------------------------------------------------------------------------------------   Rash  This is a new problem. Episode onset: Started about three days ago. (01/07/2019) The problem is unchanged. Location: Left ankle.  The rash is characterized by redness and itchiness. He was exposed to an insect bite/sting. Associated symptoms include fatigue and shortness of breath. Pertinent negatives include no anorexia, congestion, cough, diarrhea, eye pain, fever, nail changes, rhinorrhea, sore throat or vomiting.    Allergies  Allergen Reactions  . Bee Venom Anaphylaxis, Itching and Other (See Comments)    Bee stings     Current Outpatient Medications:  .  bisoprolol (ZEBETA) 10 MG tablet, Take 1 tablet (10 mg total) by mouth daily., Disp: 30 tablet, Rfl: 1 .  EPIPEN 2-PAK 0.3 MG/0.3ML SOAJ injection, use as directed by prescriber (Patient taking differently: Inject 0.3 mg into the muscle once. ), Disp: 2 Device, Rfl: 3 .  primidone (MYSOLINE) 50 MG tablet, TAKE 1 TO 2 TABLETS(50 TO 100 MG) BY MOUTH AT BEDTIME, Disp: 180 tablet, Rfl: 0 .  tamsulosin  (FLOMAX) 0.4 MG CAPS capsule, Take 0.4 mg by mouth daily as needed (urination). , Disp: , Rfl: 0 .  enoxaparin (LOVENOX) 40 MG/0.4ML injection, Inject 0.4 mLs (40 mg total) into the skin daily for 14 days., Disp: 14 Syringe, Rfl: 0 .  Glucosamine HCl-MSM (GLUCOSAMINE-MSM PO), Take 2 tablets by mouth daily. , Disp: , Rfl:  .  meloxicam (MOBIC) 15 MG tablet, Take 15 mg by mouth daily., Disp: , Rfl:  .  oxyCODONE (OXY IR/ROXICODONE) 5 MG immediate release tablet, Take 1 tablet (5 mg total) by mouth every 4 (four) hours as needed for moderate pain (pain score 4-6)., Disp: 30 tablet, Rfl: 0 .  TESTOSTERONE IL, 1 application by Implant route every 6 (six) months., Disp: , Rfl:  .  traMADol (ULTRAM) 50 MG tablet, Take 1-2 tablets (50-100 mg total) by mouth every 4 (four) hours as needed for moderate pain., Disp: 60 tablet, Rfl: 0  Review of Systems  Constitutional: Positive for fatigue. Negative for activity change, appetite change, chills, diaphoresis, fever and unexpected weight change.  HENT: Negative for congestion, rhinorrhea and sore throat.   Eyes: Negative for pain.  Respiratory: Positive for shortness of breath. Negative for apnea, cough, choking, chest tightness, wheezing and stridor.   Cardiovascular: Negative.   Gastrointestinal: Positive for blood in stool (Only happened when he was taking Aleve 2 tablets twice a day for knee pain.  Pt reports this has resolved once he stopped taking Aleve.). Negative for abdominal distention, abdominal pain,  anal bleeding, anorexia, constipation, diarrhea, nausea, rectal pain and vomiting.  Skin: Positive for rash. Negative for color change, nail changes, pallor and wound.  Allergic/Immunologic: Positive for environmental allergies.  Neurological: Positive for tremors. Negative for dizziness, light-headedness and headaches.    Social History   Tobacco Use  . Smoking status: Never Smoker  . Smokeless tobacco: Never Used  Substance Use Topics  .  Alcohol use: Yes    Alcohol/week: 1.0 standard drinks    Types: 1 Cans of beer per week    Comment: occasional use      Objective:   BP (!) 158/86 (BP Location: Right Arm, Patient Position: Sitting, Cuff Size: Large)   Pulse (!) 54   Temp 98.3 F (36.8 C) (Oral)   Wt 217 lb (98.4 kg)   BMI 31.14 kg/m  Vitals:   01/09/19 0954  BP: (!) 158/86  Pulse: (!) 54  Temp: 98.3 F (36.8 C)  TempSrc: Oral  Weight: 217 lb (98.4 kg)     Physical Exam   General Appearance:    Alert, cooperative, no distress  Eyes:    PERRL, conjunctiva/corneas clear, EOM's intact       Lungs:     Clear to auscultation bilaterally, respirations unlabored  Heart:    Regular rate and rhythm  Neurologic:   Awake, alert, oriented x 3. No apparent focal neurological           defect.   Derm:   About 3cm round slightly erythematous scaly lesion left inner ankle c/w tinea.        Assessment & Plan     1. Benign essential tremor Well controlled, continue- primidone (MYSOLINE) 50 MG tablet; TAKE 1 TO 2 TABLETS(50 TO 100 MG) BY MOUTH AT BEDTIME  Dispense: 180 tablet; Refill: 3  2. Elevated blood pressure reading Is to work on losing weight and reducing sodium in diet. Is to start checking bp at home.   3. Other fatigue  - Comprehensive metabolic panel - CBC - TSH  4. Encounter for special screening examination for cardiovascular disorder  - Lipid panel  5. Hypogonadism in male He reports that he was previously prescribed testosterone pellets by Dr. Eliberto Ivory and thinks his testosterone may be running low again. He would like to have this checked and refer back to Dr. Eliberto Ivory if low.  - Testosterone,Free and Total  6. Prostate cancer screening  - PSA  7. Anxiety He is not interested in taking any anxiety medications, but feels it may be contributing to fatigue.      The entirety of the information documented in the History of Present Illness, Review of Systems and Physical Exam were personally  obtained by me. Portions of this information were initially documented by Ashley Royalty, CMA and reviewed by me for thoroughness and accuracy.   Lelon Huh, MD  [facility]

## 2019-01-09 NOTE — Patient Instructions (Addendum)
.   Please review the attached list of medications and notify my office if there are any errors.   . Please bring all of your medications to every appointment so we can make sure that our medication list is the same as yours.   Check your blood pressure at home twice a week and let me know if it consistently over 160  You can use OTC Lamisil cream on the rash on your ankle  . Please go to the lab draw station in [address] on the second floor of [facility]  when you are fasting for 8 hours. Normal hours are 8:00am to 12:30pm and 1:30pm to 4:00pm Monday through Friday

## 2019-01-10 DIAGNOSIS — Z136 Encounter for screening for cardiovascular disorders: Secondary | ICD-10-CM | POA: Diagnosis not present

## 2019-01-10 DIAGNOSIS — R5383 Other fatigue: Secondary | ICD-10-CM | POA: Diagnosis not present

## 2019-01-10 DIAGNOSIS — Z125 Encounter for screening for malignant neoplasm of prostate: Secondary | ICD-10-CM | POA: Diagnosis not present

## 2019-01-10 DIAGNOSIS — E291 Testicular hypofunction: Secondary | ICD-10-CM | POA: Diagnosis not present

## 2019-01-11 ENCOUNTER — Other Ambulatory Visit: Payer: Self-pay | Admitting: Family Medicine

## 2019-01-11 DIAGNOSIS — G250 Essential tremor: Secondary | ICD-10-CM

## 2019-01-12 LAB — TESTOSTERONE,FREE AND TOTAL
Testosterone, Free: 7.7 pg/mL (ref 7.2–24.0)
Testosterone: 201 ng/dL — ABNORMAL LOW (ref 264–916)

## 2019-01-12 LAB — LIPID PANEL
Chol/HDL Ratio: 3.3 ratio (ref 0.0–5.0)
Cholesterol, Total: 165 mg/dL (ref 100–199)
HDL: 50 mg/dL (ref >39)
LDL Calculated: 90 mg/dL (ref 0–99)
Triglycerides: 127 mg/dL (ref 0–149)
VLDL Cholesterol Cal: 25 mg/dL (ref 5–40)

## 2019-01-12 LAB — COMPREHENSIVE METABOLIC PANEL
ALT: 16 IU/L (ref 0–44)
AST: 17 IU/L (ref 0–40)
Albumin/Globulin Ratio: 2.1 (ref 1.2–2.2)
Albumin: 4.6 g/dL (ref 3.8–4.9)
Alkaline Phosphatase: 55 IU/L (ref 39–117)
BUN/Creatinine Ratio: 15 (ref 9–20)
BUN: 13 mg/dL (ref 6–24)
Bilirubin Total: 0.5 mg/dL (ref 0.0–1.2)
CO2: 23 mmol/L (ref 20–29)
Calcium: 9.2 mg/dL (ref 8.7–10.2)
Chloride: 101 mmol/L (ref 96–106)
Creatinine, Ser: 0.88 mg/dL (ref 0.76–1.27)
GFR calc Af Amer: 109 mL/min/{1.73_m2} (ref >59)
GFR calc non Af Amer: 95 mL/min/{1.73_m2} (ref >59)
Globulin, Total: 2.2 g/dL (ref 1.5–4.5)
Glucose: 93 mg/dL (ref 65–99)
Potassium: 4.3 mmol/L (ref 3.5–5.2)
Sodium: 142 mmol/L (ref 134–144)
Total Protein: 6.8 g/dL (ref 6.0–8.5)

## 2019-01-12 LAB — PSA: Prostate Specific Ag, Serum: 2.6 ng/mL (ref 0.0–4.0)

## 2019-01-12 LAB — CBC
Hematocrit: 38.1 % (ref 37.5–51.0)
Hemoglobin: 12.9 g/dL — ABNORMAL LOW (ref 13.0–17.7)
MCH: 30.6 pg (ref 26.6–33.0)
MCHC: 33.9 g/dL (ref 31.5–35.7)
MCV: 90 fL (ref 79–97)
Platelets: 203 10*3/uL (ref 150–450)
RBC: 4.22 x10E6/uL (ref 4.14–5.80)
RDW: 12.9 % (ref 11.6–15.4)
WBC: 6.6 10*3/uL (ref 3.4–10.8)

## 2019-01-12 LAB — TSH: TSH: 2.36 u[IU]/mL (ref 0.450–4.500)

## 2019-01-17 ENCOUNTER — Telehealth: Payer: Self-pay

## 2019-01-17 DIAGNOSIS — E291 Testicular hypofunction: Secondary | ICD-10-CM

## 2019-01-17 NOTE — Telephone Encounter (Signed)
-----   Message from Birdie Sons, MD sent at 01/17/2019 12:49 PM EDT ----- Borderline low testosterone levels, rest of labs are normal. If he is interested in testosterone replacement will need to recheck levels to confirm they are consistently low.

## 2019-01-17 NOTE — Telephone Encounter (Signed)
Pt advised.  He is interested in starting testosterone replacement.  When will he need to repeat his labs?  Thanks,  -Mickel Baas

## 2019-01-18 NOTE — Telephone Encounter (Signed)
OK, please print and leave order for him at lab.

## 2019-01-19 NOTE — Telephone Encounter (Signed)
Pt advised.   Thanks,   -Laura  

## 2019-01-22 DIAGNOSIS — E291 Testicular hypofunction: Secondary | ICD-10-CM | POA: Diagnosis not present

## 2019-01-24 ENCOUNTER — Other Ambulatory Visit: Payer: Self-pay | Admitting: Family Medicine

## 2019-01-24 ENCOUNTER — Telehealth: Payer: Self-pay

## 2019-01-24 DIAGNOSIS — E291 Testicular hypofunction: Secondary | ICD-10-CM

## 2019-01-24 LAB — TESTOSTERONE,FREE AND TOTAL
Testosterone, Free: 10 pg/mL (ref 7.2–24.0)
Testosterone: 235 ng/dL — ABNORMAL LOW (ref 264–916)

## 2019-01-24 MED ORDER — ANDRODERM 4 MG/24HR TD PT24: 1.0000 | MEDICATED_PATCH | Freq: Every day | TRANSDERMAL | 3 refills | Status: DC

## 2019-01-24 NOTE — Progress Notes (Signed)
Start testosterone replacement.

## 2019-01-24 NOTE — Telephone Encounter (Signed)
Pt's wife Marlowe Kays advised.  (On DPR)   Thanks,   -Mickel Baas

## 2019-01-24 NOTE — Telephone Encounter (Signed)
-----   Message from Birdie Sons, MD sent at 01/24/2019  7:47 AM EDT ----- Repeat testosterone is low. Have sent prescription for testosterone patch to [facility]. He needs to schedule follow up in 6-8 weeks

## 2019-04-04 ENCOUNTER — Ambulatory Visit: Payer: BLUE CROSS/BLUE SHIELD | Admitting: Family Medicine

## 2019-04-20 ENCOUNTER — Ambulatory Visit: Payer: BLUE CROSS/BLUE SHIELD | Admitting: Family Medicine

## 2019-05-29 DIAGNOSIS — M170 Bilateral primary osteoarthritis of knee: Secondary | ICD-10-CM | POA: Diagnosis not present

## 2019-05-29 DIAGNOSIS — Z96651 Presence of right artificial knee joint: Secondary | ICD-10-CM | POA: Diagnosis not present

## 2019-05-29 DIAGNOSIS — Z96652 Presence of left artificial knee joint: Secondary | ICD-10-CM | POA: Diagnosis not present

## 2019-08-06 ENCOUNTER — Emergency Department: Payer: BC Managed Care – PPO

## 2019-08-06 ENCOUNTER — Other Ambulatory Visit: Payer: Self-pay

## 2019-08-06 ENCOUNTER — Encounter: Payer: Self-pay | Admitting: Emergency Medicine

## 2019-08-06 ENCOUNTER — Observation Stay: Payer: BC Managed Care – PPO

## 2019-08-06 ENCOUNTER — Observation Stay
Admission: EM | Admit: 2019-08-06 | Discharge: 2019-08-07 | Disposition: A | Discharge status: Home / Self Care | Payer: BC Managed Care – PPO | Attending: Internal Medicine | Admitting: Internal Medicine

## 2019-08-06 ENCOUNTER — Ambulatory Visit: Payer: Self-pay | Admitting: *Deleted

## 2019-08-06 DIAGNOSIS — R42 Dizziness and giddiness: Secondary | ICD-10-CM | POA: Insufficient documentation

## 2019-08-06 DIAGNOSIS — I10 Essential (primary) hypertension: Secondary | ICD-10-CM | POA: Diagnosis not present

## 2019-08-06 DIAGNOSIS — Z8673 Personal history of transient ischemic attack (TIA), and cerebral infarction without residual deficits: Secondary | ICD-10-CM | POA: Diagnosis not present

## 2019-08-06 DIAGNOSIS — Z23 Encounter for immunization: Secondary | ICD-10-CM | POA: Diagnosis not present

## 2019-08-06 DIAGNOSIS — Z6831 Body mass index (BMI) 31.0-31.9, adult: Secondary | ICD-10-CM | POA: Diagnosis not present

## 2019-08-06 DIAGNOSIS — E669 Obesity, unspecified: Secondary | ICD-10-CM | POA: Diagnosis not present

## 2019-08-06 DIAGNOSIS — Z96651 Presence of right artificial knee joint: Secondary | ICD-10-CM | POA: Insufficient documentation

## 2019-08-06 DIAGNOSIS — I639 Cerebral infarction, unspecified: Secondary | ICD-10-CM | POA: Diagnosis not present

## 2019-08-06 DIAGNOSIS — Z79899 Other long term (current) drug therapy: Secondary | ICD-10-CM | POA: Diagnosis not present

## 2019-08-06 DIAGNOSIS — K219 Gastro-esophageal reflux disease without esophagitis: Secondary | ICD-10-CM | POA: Insufficient documentation

## 2019-08-06 DIAGNOSIS — R531 Weakness: Secondary | ICD-10-CM | POA: Insufficient documentation

## 2019-08-06 DIAGNOSIS — N400 Enlarged prostate without lower urinary tract symptoms: Secondary | ICD-10-CM | POA: Diagnosis not present

## 2019-08-06 DIAGNOSIS — Z20828 Contact with and (suspected) exposure to other viral communicable diseases: Secondary | ICD-10-CM | POA: Insufficient documentation

## 2019-08-06 DIAGNOSIS — Z96652 Presence of left artificial knee joint: Secondary | ICD-10-CM | POA: Diagnosis not present

## 2019-08-06 DIAGNOSIS — R279 Unspecified lack of coordination: Secondary | ICD-10-CM | POA: Insufficient documentation

## 2019-08-06 DIAGNOSIS — M1990 Unspecified osteoarthritis, unspecified site: Secondary | ICD-10-CM | POA: Diagnosis not present

## 2019-08-06 DIAGNOSIS — I6523 Occlusion and stenosis of bilateral carotid arteries: Secondary | ICD-10-CM | POA: Diagnosis not present

## 2019-08-06 DIAGNOSIS — G250 Essential tremor: Secondary | ICD-10-CM

## 2019-08-06 LAB — CBC
HCT: 40.2 % (ref 39.0–52.0)
Hemoglobin: 13.5 g/dL (ref 13.0–17.0)
MCH: 30.6 pg (ref 26.0–34.0)
MCHC: 33.6 g/dL (ref 30.0–36.0)
MCV: 91.2 fL (ref 80.0–100.0)
Platelets: 190 10*3/uL (ref 150–400)
RBC: 4.41 MIL/uL (ref 4.22–5.81)
RDW: 12.6 % (ref 11.5–15.5)
WBC: 7.2 10*3/uL (ref 4.0–10.5)
nRBC: 0 % (ref 0.0–0.2)

## 2019-08-06 LAB — GLUCOSE, CAPILLARY: Glucose-Capillary: 91 mg/dL (ref 70–99)

## 2019-08-06 LAB — COMPREHENSIVE METABOLIC PANEL
ALT: 24 U/L (ref 0–44)
AST: 23 U/L (ref 15–41)
Albumin: 4.5 g/dL (ref 3.5–5.0)
Alkaline Phosphatase: 46 U/L (ref 38–126)
Anion gap: 10 (ref 5–15)
BUN: 13 mg/dL (ref 6–20)
CO2: 25 mmol/L (ref 22–32)
Calcium: 9 mg/dL (ref 8.9–10.3)
Chloride: 103 mmol/L (ref 98–111)
Creatinine, Ser: 0.69 mg/dL (ref 0.61–1.24)
GFR calc Af Amer: >60 mL/min (ref >60)
GFR calc non Af Amer: >60 mL/min (ref >60)
Glucose, Bld: 93 mg/dL (ref 70–99)
Potassium: 4.3 mmol/L (ref 3.5–5.1)
Sodium: 138 mmol/L (ref 135–145)
Total Bilirubin: 0.6 mg/dL (ref 0.3–1.2)
Total Protein: 7.2 g/dL (ref 6.5–8.1)

## 2019-08-06 LAB — HIV ANTIBODY (ROUTINE TESTING W REFLEX): HIV Screen 4th Generation wRfx: NONREACTIVE

## 2019-08-06 LAB — TROPONIN I (HIGH SENSITIVITY)
Troponin I (High Sensitivity): 5 ng/L (ref <18)
Troponin I (High Sensitivity): 7 ng/L (ref <18)

## 2019-08-06 LAB — TSH: TSH: 1.558 u[IU]/mL (ref 0.350–4.500)

## 2019-08-06 MED ORDER — ASPIRIN 325 MG PO TABS
325.0000 mg | ORAL_TABLET | Freq: Every day | ORAL | Status: DC
  Administered 2019-08-07: 325 mg via ORAL
  Filled 2019-08-06: qty 1

## 2019-08-06 MED ORDER — AMLODIPINE BESYLATE 5 MG PO TABS
5.0000 mg | ORAL_TABLET | Freq: Every day | ORAL | Status: DC
  Administered 2019-08-06 – 2019-08-07 (×2): 5 mg via ORAL
  Filled 2019-08-06 (×2): qty 1

## 2019-08-06 MED ORDER — ACETAMINOPHEN 325 MG PO TABS
650.0000 mg | ORAL_TABLET | ORAL | Status: DC | PRN
  Administered 2019-08-06 – 2019-08-07 (×2): 650 mg via ORAL
  Filled 2019-08-06 (×2): qty 2

## 2019-08-06 MED ORDER — INFLUENZA VAC SPLIT QUAD 0.5 ML IM SUSY
0.5000 mL | PREFILLED_SYRINGE | INTRAMUSCULAR | Status: AC
  Administered 2019-08-07: 09:00:00 0.5 mL via INTRAMUSCULAR
  Filled 2019-08-06: qty 0.5

## 2019-08-06 MED ORDER — ASPIRIN 81 MG PO CHEW
324.0000 mg | CHEWABLE_TABLET | Freq: Once | ORAL | Status: AC
  Administered 2019-08-06: 324 mg via ORAL
  Filled 2019-08-06: qty 4

## 2019-08-06 MED ORDER — PRIMIDONE 50 MG PO TABS
50.0000 mg | ORAL_TABLET | Freq: Every day | ORAL | Status: DC
  Administered 2019-08-06: 22:00:00 50 mg via ORAL
  Filled 2019-08-06 (×2): qty 1

## 2019-08-06 MED ORDER — SODIUM CHLORIDE 0.9% FLUSH: 3.0000 mL | Freq: Once | INTRAVENOUS | Status: DC

## 2019-08-06 MED ORDER — ACETAMINOPHEN 160 MG/5ML PO SOLN
650.0000 mg | ORAL | Status: DC | PRN
  Filled 2019-08-06: qty 20.3

## 2019-08-06 MED ORDER — SODIUM CHLORIDE 0.9 % IV SOLN: INTRAVENOUS | Status: DC

## 2019-08-06 MED ORDER — STROKE: EARLY STAGES OF RECOVERY BOOK: Freq: Once | Status: AC

## 2019-08-06 MED ORDER — ACETAMINOPHEN 650 MG RE SUPP: 650.0000 mg | RECTAL | Status: DC | PRN

## 2019-08-06 MED ORDER — SENNOSIDES-DOCUSATE SODIUM 8.6-50 MG PO TABS: 1.0000 | ORAL_TABLET | Freq: Every evening | ORAL | Status: DC | PRN

## 2019-08-06 MED ORDER — ENOXAPARIN SODIUM 40 MG/0.4ML ~~LOC~~ SOLN
40.0000 mg | SUBCUTANEOUS | Status: DC
  Administered 2019-08-06: 22:00:00 40 mg via SUBCUTANEOUS
  Filled 2019-08-06: qty 0.4

## 2019-08-06 NOTE — ED Notes (Signed)
Patient transported to MRI 

## 2019-08-06 NOTE — H&P (Signed)
History and Physical    Rodney Rojas [id number] DOB: Jan 01, 1961 DOA: 08/06/2019  PCP: Birdie Sons, MD  Patient coming from: Home  I have personally briefly reviewed patient's old medical records in [place]  Chief Complaint: Dizziness, unsteady on feet  HPI: Rodney Rojas is a 58 y.o. male with medical history significant of benign essential tremor, hypertension not on treatment, stage I obesity presents to the ED for evaluation of dizziness and unsteadiness on her feet since Wednesday, 5 days prior to presentation to the ED.  No clear inciting factors.  Patient is a employed as a [employer] manager and stated one of his colleagues noted him to be ataxic gait.  Patient states the sensation is similar to being drunk.  Denies any overt alcohol or substance abuse.  No clear inciting factors.  No previous instances of such symptoms.  Patient states that since onset of symptoms on Wednesday then gradually improving.  No history of stroke.  No family history of stroke.  ED Course: Initial laboratory investigation the emergency department overall reassuring.  ED staff noted him to have some right-sided weakness and mild loss of balance and coordination.  325 mg of aspirin administered.  MRI significant for a 1 cm pontine CVA.  Hospitalist called for admission.  Review of Systems: As per HPI otherwise 10 point review of systems negative.    Past Medical History:  Diagnosis Date  . Arthritis   . Complication of anesthesia    slow to wake up after colonoscopy  . GERD (gastroesophageal reflux disease)   . Headache   . History of chicken pox   . History of kidney stones   . Prostate enlargement   . Tremors of nervous system     Past Surgical History:  Procedure Laterality Date  . CARDIOVASCULAR STRESS TEST  12/28/2013   Normal; [facility]  . CATARACT EXTRACTION W/PHACO Left 10/11/2017   Procedure: CATARACT EXTRACTION PHACO AND INTRAOCULAR LENS PLACEMENT (IOC);  Surgeon: Birder Robson, MD;  Location: [facility] ORS;  Service: Ophthalmology;  Laterality: Left;  [place] 00:23.0 AP% 13.1 CDE 3.02 Fluid Pack Lot # [id number]  . CYSTOSCOPY  1994  . HERNIA REPAIR  0000000   umbilical hernia repair; Dr. Pat Patrick  . KNEE ARTHROPLASTY Right 02/25/2016   Procedure: COMPUTER ASSISTED TOTAL KNEE ARTHROPLASTY;  Surgeon: Dereck Leep, MD;  Location: [facility] ORS;  Service: Orthopedics;  Laterality: Right;  . KNEE ARTHROPLASTY Left 06/05/2018   Procedure: COMPUTER ASSISTED TOTAL KNEE ARTHROPLASTY;  Surgeon: Dereck Leep, MD;  Location: [facility] ORS;  Service: Orthopedics;  Laterality: Left;  . REPLACEMENT TOTAL KNEE Right   . VASECTOMY  2000     reports that he has never smoked. He has never used smokeless tobacco. He reports current alcohol use of about 1.0 standard drinks of alcohol per week. He reports that he does not use drugs.  Allergies  Allergen Reactions  . Bee Venom Anaphylaxis, Itching and Other (See Comments)    Bee stings    Family History  Problem Relation Age of Onset  . Heart Problems Father        had 3V CABG in his 105's  . Diabetes Father   . Hypertension Father   . Cirrhosis Father     Prior to Admission medications   Medication Sig Start Date End Date Taking? Authorizing Provider  bisoprolol (ZEBETA) 10 MG tablet TAKE 1 TABLET(10 MG) BY MOUTH DAILY 01/11/19  Yes Birdie Sons, MD  primidone (MYSOLINE)  50 MG tablet TAKE 1 TO 2 TABLETS(50 TO 100 MG) BY MOUTH AT BEDTIME 01/09/19  Yes Birdie Sons, MD    Physical Exam: Vitals:   08/06/19 0955  BP: (!) 161/80  Pulse: (!) 58  Resp: 18  Temp: 98 F (36.7 C)  TempSrc: Oral  SpO2: 98%  Weight: 97.5 kg  Height: 5\' 9"  (1.753 m)    Constitutional: NAD, calm, comfortable Vitals:   08/06/19 0955  BP: (!) 161/80  Pulse: (!) 58  Resp: 18  Temp: 98 F (36.7 C)  TempSrc: Oral  SpO2: 98%  Weight: 97.5 kg  Height: 5\' 9"  ([id number] m)   Eyes: PERRL, lids and conjunctivae normal ENMT: Mucous membranes are  moist. Posterior pharynx clear of any exudate or lesions.Normal dentition.  Neck: normal, supple, no masses, no thyromegaly Respiratory: clear to auscultation bilaterally, no wheezing, no crackles. Normal respiratory effort. No accessory muscle use.  Cardiovascular: Regular rate and rhythm, no murmurs / rubs / gallops. No extremity edema. 2+ pedal pulses. No carotid bruits.  Abdomen: no tenderness, no masses palpated. No hepatosplenomegaly. Bowel sounds positive.  Musculoskeletal: no clubbing / cyanosis. No joint deformity upper and lower extremities. Good ROM, no contractures. Normal muscle tone.  Skin: no rashes, lesions, ulcers. No induration Neurologic: Dysmetria noted in right upper and right lower extremity Psychiatric: Normal judgment and insight. Alert and oriented x 3. Normal mood.   Labs on Admission: I have personally reviewed following labs and imaging studies  CBC: Recent Labs  Lab 08/06/19 1149  WBC 7.2  HGB 13.5  HCT 40.2  MCV 91.2  PLT 99991111   Basic Metabolic Panel: Recent Labs  Lab 08/06/19 1149  NA 138  K 4.3  CL 103  CO2 25  GLUCOSE 93  BUN 13  CREATININE 0.69  CALCIUM 9.0   GFR: Estimated Creatinine Clearance: 115.9 mL/min (by C-G formula based on SCr of 0.69 mg/dL). Liver Function Tests: Recent Labs  Lab 08/06/19 1149  AST 23  ALT 24  ALKPHOS 46  BILITOT 0.6  PROT 7.2  ALBUMIN 4.5   No results for input(s): LIPASE, AMYLASE in the last 168 hours. No results for input(s): AMMONIA in the last 168 hours. Coagulation Profile: No results for input(s): INR, PROTIME in the last 168 hours. Cardiac Enzymes: No results for input(s): CKTOTAL, CKMB, CKMBINDEX, TROPONINI in the last 168 hours. BNP (last 3 results) No results for input(s): PROBNP in the last 8760 hours. HbA1C: No results for input(s): HGBA1C in the last 72 hours. CBG: Recent Labs  Lab 08/06/19 1229  GLUCAP 91   Lipid Profile: No results for input(s): CHOL, HDL, LDLCALC, TRIG,  CHOLHDL, LDLDIRECT in the last 72 hours. Thyroid Function Tests: Recent Labs    08/06/19 1149  TSH 1.558   Anemia Panel: No results for input(s): VITAMINB12, FOLATE, FERRITIN, TIBC, IRON, RETICCTPCT in the last 72 hours. Urine analysis:    Component Value Date/Time   COLORURINE YELLOW (A) 05/25/2018 0905   APPEARANCEUR CLEAR (A) 05/25/2018 0905   LABSPEC 1.012 05/25/2018 0905   PHURINE 7.0 05/25/2018 0905   GLUCOSEU NEGATIVE 05/25/2018 0905   HGBUR NEGATIVE 05/25/2018 0905   BILIRUBINUR NEGATIVE 05/25/2018 0905   KETONESUR NEGATIVE 05/25/2018 0905   PROTEINUR NEGATIVE 05/25/2018 0905   NITRITE NEGATIVE 05/25/2018 0905   LEUKOCYTESUR NEGATIVE 05/25/2018 0905    Radiological Exams on Admission: DG Chest 2 View  Result Date: 08/06/2019 CLINICAL DATA:  Dizziness EXAM: CHEST - 2 VIEW COMPARISON:  2015 FINDINGS: The heart  size and mediastinal contours are within normal limits. Both lungs are clear. No pleural effusion. The visualized skeletal structures are unremarkable. IMPRESSION: No acute process in the chest. Electronically Signed   By: Macy Mis M.D.   On: 08/06/2019 10:32   CT Head Wo Contrast  Result Date: 08/06/2019 CLINICAL DATA:  Dizziness, upper extremity weakness EXAM: CT HEAD WITHOUT CONTRAST TECHNIQUE: Contiguous axial images were obtained from the base of the skull through the vertex without intravenous contrast. COMPARISON:  06/09/2018 FINDINGS: Brain: No evidence of acute infarction, hemorrhage, hydrocephalus, extra-axial collection or mass lesion/mass effect. Vascular: No hyperdense vessel or unexpected calcification. Skull: Normal. Negative for fracture or focal lesion. Sinuses/Orbits: The visualized paranasal sinuses are essentially clear. The mastoid air cells are unopacified. Other: None. IMPRESSION: Normal head CT. Electronically Signed   By: Julian Hy M.D.   On: 08/06/2019 10:28   MR BRAIN WO CONTRAST  Result Date: 08/06/2019 CLINICAL DATA:   Dizziness EXAM: MRI HEAD WITHOUT CONTRAST TECHNIQUE: Multiplanar, multiecho pulse sequences of the brain and surrounding structures were obtained without intravenous contrast. COMPARISON:  Correlation made with prior CT imaging FINDINGS: Brain: There is a 1 cm focus of reduced diffusion at the left aspect of the pons. There is no evidence of intracranial hemorrhage. There is no intracranial mass, mass effect, or edema. There is no hydrocephalus or extra-axial fluid collection. Vascular: Major vessel flow voids at the skull base are preserved. Skull and upper cervical spine: Normal marrow signal is preserved. Sinuses/Orbits: Minor mucosal thickening.  Left lens replacement. Other: Sella is unremarkable.  Mastoid air cells are clear. IMPRESSION: Small acute infarction of the left pons. Electronically Signed   By: Macy Mis M.D.   On: 08/06/2019 14:21    EKG: Independently reviewed.  Normal sinus rhythm  Assessment/Plan Active Problems:   CVA (cerebral vascular accident) ([place])  Acute left Pons stroke Confirmed on MRI brain In line with patient symptoms Plan: Place in observation Telemetry monitoring Daily aspirin Check carotid ultrasound Check echocardiogram Blood pressure control Check fasting lipids Check hemoglobin A1c Neurology consult, message sent to Dr. Doy Mince Physical therapy and Occupational Therapy consults  Suspected hypertension Patient states that he does have a home blood pressure cuff and was formally instructed to take his blood pressure at home however after multiple normal readings he ceased to do so We will start amlodipine 5 mg daily, uptitrate as needed  Essential tremor Patient takes bisoprolol and primidone Hold bisoprolol given relative bradycardia Continue primidone   DVT prophylaxis: Lovenox Code Status: Full Family Communication: Wife Marlowe Kays updated on 12/28.  [phone]. Disposition Plan: Home Consults called: Neurology-Dr. Doy Mince Admission  status: Obs   Sidney Ace MD [facility] Pager [phone]  If 7PM-7AM, please contact night-coverage [email or web address] Password [email or web address]  08/06/2019, 3:10 PM

## 2019-08-06 NOTE — ED Notes (Signed)
Pt reports increased feeling of dizziness that started last Wednesday. States that he has been experiencing weakness in his right arm and leg. States that at night both his hands fall asleep and begin to feel numb over the past few weeks.

## 2019-08-06 NOTE — ED Provider Notes (Signed)
[facility] Emergency Department Provider Note  ____________________________________________   First MD Initiated Contact with Patient 08/06/19 1230     (approximate)  I have reviewed the triage vital signs and the nursing notes.   HISTORY  Chief Complaint Dizziness and Weakness    HPI Rodney Rojas is a 58 y.o. male  Here with dizziness and weakness. Pt states that since Wednesday, he has felt off balance and dizzy. He feels like he has had something to drink.  Symptoms started on Wednesday.  He states he was working at the time and suddenly felt like he lost his balance.  He felt like the room is spinning.  He then felt weak and like he was clumsy with his right arm and leg.  Since then, he has percent instantly felt dizzy, as well as mildly weak with difficulty coordinating his right arm and leg.  Denies any numbness.  Denies any headache with this.  He has never had symptoms similar to this.  No longer feels a vertigo sensation.  Symptoms began Wednesday and have been gradually improving.  No alleviating factors.  No history of stroke.       Past Medical History:  Diagnosis Date  . Arthritis   . Complication of anesthesia    slow to wake up after colonoscopy  . GERD (gastroesophageal reflux disease)   . Headache   . History of chicken pox   . History of kidney stones   . Prostate enlargement   . Tremors of nervous system     Patient Active Problem List   Diagnosis Date Noted  . Hypogonadism in male 01/24/2019  . S/P total knee arthroplasty 06/05/2018  . Primary osteoarthritis of left knee 07/21/2017  . Status post total right knee replacement 02/25/2016  . Chronic pain of left knee 02/24/2015  . GERD (gastroesophageal reflux disease) 02/24/2015  . Urinary hesitancy 02/24/2015  . Fam hx-ischem heart disease 09/01/2009  . Family history of prostate cancer 09/01/2009  . Umbilical hernia without obstruction and without gangrene 09/01/2009    . Benign essential tremor 01/14/2009  . Allergy to insects and arachnids 08/09/1998    Past Surgical History:  Procedure Laterality Date  . CARDIOVASCULAR STRESS TEST  12/28/2013   Normal; [facility]  . CATARACT EXTRACTION W/PHACO Left 10/11/2017   Procedure: CATARACT EXTRACTION PHACO AND INTRAOCULAR LENS PLACEMENT (IOC);  Surgeon: Birder Robson, MD;  Location: [facility] ORS;  Service: Ophthalmology;  Laterality: Left;  [place] 00:23.0 AP% 13.1 CDE 3.02 Fluid Pack Lot # [id number]  . CYSTOSCOPY  1994  . HERNIA REPAIR  0000000   umbilical hernia repair; Dr. Pat Patrick  . KNEE ARTHROPLASTY Right 02/25/2016   Procedure: COMPUTER ASSISTED TOTAL KNEE ARTHROPLASTY;  Surgeon: Dereck Leep, MD;  Location: [facility] ORS;  Service: Orthopedics;  Laterality: Right;  . KNEE ARTHROPLASTY Left 06/05/2018   Procedure: COMPUTER ASSISTED TOTAL KNEE ARTHROPLASTY;  Surgeon: Dereck Leep, MD;  Location: [facility] ORS;  Service: Orthopedics;  Laterality: Left;  . REPLACEMENT TOTAL KNEE Right   . VASECTOMY  2000    Prior to Admission medications   Medication Sig Start Date End Date Taking? Authorizing Provider  bisoprolol (ZEBETA) 10 MG tablet TAKE 1 TABLET(10 MG) BY MOUTH DAILY 01/11/19  Yes Birdie Sons, MD  primidone (MYSOLINE) 50 MG tablet TAKE 1 TO 2 TABLETS(50 TO 100 MG) BY MOUTH AT BEDTIME 01/09/19  Yes Birdie Sons, MD    Allergies Bee venom  Family History  Problem Relation Age  of Onset  . Heart Problems Father        had 3V CABG in his 61's  . Diabetes Father   . Hypertension Father   . Cirrhosis Father     Social History Social History   Tobacco Use  . Smoking status: Never Smoker  . Smokeless tobacco: Never Used  Substance Use Topics  . Alcohol use: Yes    Alcohol/week: 1.0 standard drinks    Types: 1 Cans of beer per week    Comment: occasional use  . Drug use: No    Review of Systems  Review of Systems  Constitutional: Negative for chills, fatigue and fever.  HENT: Negative for sore  throat.   Respiratory: Negative for shortness of breath.   Cardiovascular: Negative for chest pain.  Gastrointestinal: Negative for abdominal pain.  Genitourinary: Negative for flank pain.  Musculoskeletal: Negative for neck pain.  Skin: Negative for rash and wound.  Allergic/Immunologic: Negative for immunocompromised state.  Neurological: Positive for dizziness, weakness and numbness.  Hematological: Does not bruise/bleed easily.  All other systems reviewed and are negative.    ____________________________________________  PHYSICAL EXAM:      VITAL SIGNS: ED Triage Vitals [08/06/19 0955]  Enc Vitals Group     BP (!) 161/80     Pulse Rate (!) 58     Resp 18     Temp 98 F (36.7 C)     Temp Source Oral     SpO2 98 %     Weight 215 lb (97.5 kg)     Height 5\' 9"  (1.753 m)     Head Circumference      Peak Flow      Pain Score 0     Pain Loc      Pain Edu?      Excl. in [place]?      Physical Exam Vitals and nursing note reviewed.  Constitutional:      General: He is not in acute distress.    Appearance: He is well-developed.  HENT:     Head: Normocephalic and atraumatic.  Eyes:     Conjunctiva/sclera: Conjunctivae normal.  Cardiovascular:     Rate and Rhythm: Normal rate and regular rhythm.     Heart sounds: Normal heart sounds. No murmur. No friction rub.  Pulmonary:     Effort: Pulmonary effort is normal. No respiratory distress.     Breath sounds: Normal breath sounds. No wheezing or rales.  Abdominal:     General: There is no distension.     Palpations: Abdomen is soft.     Tenderness: There is no abdominal tenderness.  Musculoskeletal:     Cervical back: Neck supple.  Skin:    General: Skin is warm.     Capillary Refill: Capillary refill takes less than 2 seconds.     Findings: No rash.  Neurological:     Mental Status: He is alert and oriented to person, place, and time.     Motor: No abnormal muscle tone.     Comments: Neurological Exam:  Mental  Status: Alert and oriented to person, place, and time. Attention and concentration normal. Speech clear. Recent memory is intact. Cranial Nerves: Visual fields grossly intact. EOMI and PERRLA. No nystagmus noted. Facial sensation intact at forehead, maxillary cheek, and chin/mandible bilaterally. No facial asymmetry or weakness. Hearing grossly normal. Uvula is midline, and palate elevates symmetrically. Normal SCM and trapezius strength. Tongue midline without fasciculations. Motor: Muscle strength 5/5 in proximal and distal  UE and LE bilaterally. No pronator drift. Muscle tone normal. Sensation: Intact to light touch in upper and lower extremities distally bilaterally.  Gait: Mild ataxia Coordination: Dysmetria on right FTN and right HTS          ____________________________________________   LABS (all labs ordered are listed, but only abnormal results are displayed)  Labs Reviewed  TSH  CBC  COMPREHENSIVE METABOLIC PANEL  GLUCOSE, CAPILLARY  URINALYSIS, COMPLETE (UACMP) WITH MICROSCOPIC  CBG MONITORING, ED  TROPONIN I (HIGH SENSITIVITY)  TROPONIN I (HIGH SENSITIVITY)    ____________________________________________  EKG: Sinus bradycardia, ventricular rate 56.  PR 168, QRS 104, QTc 432.  No acute ST elevations or depressions. ________________________________________  RADIOLOGY All imaging, including plain films, CT scans, and ultrasounds, independently reviewed by me, and interpretations confirmed via formal radiology reads.  ED MD interpretation:   CXR: Clear CT Head: [place]  Official radiology report(s): DG Chest 2 View  Result Date: 08/06/2019 CLINICAL DATA:  Dizziness EXAM: CHEST - 2 VIEW COMPARISON:  2015 FINDINGS: The heart size and mediastinal contours are within normal limits. Both lungs are clear. No pleural effusion. The visualized skeletal structures are unremarkable. IMPRESSION: No acute process in the chest. Electronically Signed   By: Macy Mis M.D.    On: 08/06/2019 10:32   CT Head Wo Contrast  Result Date: 08/06/2019 CLINICAL DATA:  Dizziness, upper extremity weakness EXAM: CT HEAD WITHOUT CONTRAST TECHNIQUE: Contiguous axial images were obtained from the base of the skull through the vertex without intravenous contrast. COMPARISON:  06/09/2018 FINDINGS: Brain: No evidence of acute infarction, hemorrhage, hydrocephalus, extra-axial collection or mass lesion/mass effect. Vascular: No hyperdense vessel or unexpected calcification. Skull: Normal. Negative for fracture or focal lesion. Sinuses/Orbits: The visualized paranasal sinuses are essentially clear. The mastoid air cells are unopacified. Other: None. IMPRESSION: Normal head CT. Electronically Signed   By: Julian Hy M.D.   On: 08/06/2019 10:28   MR BRAIN WO CONTRAST  Result Date: 08/06/2019 CLINICAL DATA:  Dizziness EXAM: MRI HEAD WITHOUT CONTRAST TECHNIQUE: Multiplanar, multiecho pulse sequences of the brain and surrounding structures were obtained without intravenous contrast. COMPARISON:  Correlation made with prior CT imaging FINDINGS: Brain: There is a 1 cm focus of reduced diffusion at the left aspect of the pons. There is no evidence of intracranial hemorrhage. There is no intracranial mass, mass effect, or edema. There is no hydrocephalus or extra-axial fluid collection. Vascular: Major vessel flow voids at the skull base are preserved. Skull and upper cervical spine: Normal marrow signal is preserved. Sinuses/Orbits: Minor mucosal thickening.  Left lens replacement. Other: Sella is unremarkable.  Mastoid air cells are clear. IMPRESSION: Small acute infarction of the left pons. Electronically Signed   By: Macy Mis M.D.   On: 08/06/2019 14:21    ____________________________________________  PROCEDURES   Procedure(s) performed (including Critical Care):  Procedures  ____________________________________________  INITIAL IMPRESSION / MDM / [place] / ED  COURSE  As part of my medical decision making, I reviewed the following data within the [place] notes reviewed and incorporated, Old chart reviewed, Notes from prior ED visits, and [place] Controlled Substance Database       *Rodney Rojas was evaluated in Emergency Department on 08/06/2019 for the symptoms described in the history of present illness. He was evaluated in the context of the global COVID-19 pandemic, which necessitated consideration that the patient might be at risk for infection with the SARS-CoV-2 virus that causes COVID-19. Institutional protocols and  algorithms that pertain to the evaluation of patients at risk for COVID-19 are in a state of rapid change based on information released by regulatory bodies including the CDC and federal and state organizations. These policies and algorithms were followed during the patient's care in the ED.  Some ED evaluations and interventions may be delayed as a result of limited staffing during the pandemic.*     Medical Decision Making:  58 yo M here with R sided weakness, loss of balance/coordination. Exam is concerning for pontine vs cerebellar CVA, which is confirmed on MRI. Sx started Wed and he is well outside any intervention window. Will admit to hospitalist. ASA given.  ____________________________________________  FINAL CLINICAL IMPRESSION(S) / ED DIAGNOSES  Final diagnoses:  Cerebrovascular accident (CVA), unspecified mechanism ([place])     MEDICATIONS GIVEN DURING THIS VISIT:  Medications  sodium chloride flush (NS) 0.9 % injection 3 mL (has no administration in time range)  aspirin chewable tablet 324 mg (324 mg Oral Given 08/06/19 1330)     ED Discharge Orders    None       Note:  This document was prepared using Dragon voice recognition software and may include unintentional dictation errors.   Duffy Bruce, MD 08/06/19 [phone]

## 2019-08-06 NOTE — Consult Note (Signed)
Referring Physician: Priscella Mann    Chief Complaint: Dizziness  HPI: Rodney Rojas is an 58 y.o. male with a history of BET who presents with complaints of dizziness and right sided weakness.  The patient reports that at about 430pm on 12/23 the patient reports acute onset dizziness.  Patient was working on repairs when the dizziness occurred.  On rising the patient then noted that he was off balance.  Had difficulty using his right arm and leg as well.  This has improved over the past few days but patient has not returned to baseline.  Wife encouraged patient to present to the ED.  Initial NIHSS of 0.  Date last known well: Date: 08/01/2019 Time last known well: Time: 16:30 tPA Given: No: Outside time window  Past Medical History:  Diagnosis Date  . Arthritis   . Complication of anesthesia    slow to wake up after colonoscopy  . GERD (gastroesophageal reflux disease)   . Headache   . History of chicken pox   . History of kidney stones   . Prostate enlargement   . Tremors of nervous system     Past Surgical History:  Procedure Laterality Date  . CARDIOVASCULAR STRESS TEST  12/28/2013   Normal; [facility]  . CATARACT EXTRACTION W/PHACO Left 10/11/2017   Procedure: CATARACT EXTRACTION PHACO AND INTRAOCULAR LENS PLACEMENT (IOC);  Surgeon: Birder Robson, MD;  Location: [facility] ORS;  Service: Ophthalmology;  Laterality: Left;  [place] 00:23.0 AP% 13.1 CDE 3.02 Fluid Pack Lot # [id number]  . CYSTOSCOPY  1994  . HERNIA REPAIR  0000000   umbilical hernia repair; Dr. Pat Patrick  . KNEE ARTHROPLASTY Right 02/25/2016   Procedure: COMPUTER ASSISTED TOTAL KNEE ARTHROPLASTY;  Surgeon: Dereck Leep, MD;  Location: [facility] ORS;  Service: Orthopedics;  Laterality: Right;  . KNEE ARTHROPLASTY Left 06/05/2018   Procedure: COMPUTER ASSISTED TOTAL KNEE ARTHROPLASTY;  Surgeon: Dereck Leep, MD;  Location: [facility] ORS;  Service: Orthopedics;  Laterality: Left;  . REPLACEMENT TOTAL KNEE Right   . VASECTOMY  2000     Family History  Problem Relation Age of Onset  . Heart Problems Father        had 3V CABG in his 50's  . Diabetes Father   . Hypertension Father   . Cirrhosis Father    Social History:  reports that he has never smoked. He has never used smokeless tobacco. He reports current alcohol use of about 1.0 standard drinks of alcohol per week. He reports that he does not use drugs.  Allergies:  Allergies  Allergen Reactions  . Bee Venom Anaphylaxis, Itching and Other (See Comments)    Bee stings    Medications:  I have reviewed the patient's current medications. Prior to Admission:  Medications Prior to Admission  Medication Sig Dispense Refill Last Dose  . bisoprolol (ZEBETA) 10 MG tablet TAKE 1 TABLET(10 MG) BY MOUTH DAILY 30 tablet 5 08/06/2019 at 0900  . primidone (MYSOLINE) 50 MG tablet TAKE 1 TO 2 TABLETS(50 TO 100 MG) BY MOUTH AT BEDTIME 180 tablet 3 08/05/2019 at Unknown time   Scheduled: .  stroke: mapping our early stages of recovery book   Does not apply Once  . amLODipine  5 mg Oral Daily  . aspirin  325 mg Oral Daily  . enoxaparin (LOVENOX) injection  40 mg Subcutaneous Q24H  . primidone  50 mg Oral QHS  . sodium chloride flush  3 mL Intravenous Once    ROS: History obtained from the  patient  General ROS: negative for - chills, fatigue, fever, night sweats, weight gain or weight loss Psychological ROS: negative for - behavioral disorder, hallucinations, memory difficulties, mood swings or suicidal ideation Ophthalmic ROS: negative for - blurry vision, double vision, eye pain or loss of vision ENT ROS: negative for - epistaxis, nasal discharge, oral lesions, sore throat, tinnitus or vertigo Allergy and Immunology ROS: negative for - hives or itchy/watery eyes Hematological and Lymphatic ROS: negative for - bleeding problems, bruising or swollen lymph nodes Endocrine ROS: negative for - galactorrhea, hair pattern changes, polydipsia/polyuria or temperature  intolerance Respiratory ROS: negative for - cough, hemoptysis, shortness of breath or wheezing Cardiovascular ROS: negative for - chest pain, dyspnea on exertion, edema or irregular heartbeat Gastrointestinal ROS: negative for - abdominal pain, diarrhea, hematemesis, nausea/vomiting or stool incontinence Genito-Urinary ROS: negative for - dysuria, hematuria, incontinence or urinary frequency/urgency Musculoskeletal ROS: negative for - joint swelling or muscular weakness Neurological ROS: as noted in HPI Dermatological ROS: negative for rash and skin lesion changes  Physical Examination: Blood pressure (!) 161/80, pulse (!) 58, temperature 98 F (36.7 C), temperature source Oral, resp. rate 18, height 5\' 9"  (1.753 m), weight 97.5 kg, SpO2 98 %.  HEENT-  Normocephalic, no lesions, without obvious abnormality.  Normal external eye and conjunctiva.  Normal TM's bilaterally.  Normal auditory canals and external ears. Normal external nose, mucus membranes and septum.  Normal pharynx. Cardiovascular- S1, S2 normal, pulses palpable throughout   Lungs- chest clear, no wheezing, rales, normal symmetric air entry Abdomen- soft, non-tender; bowel sounds normal; no masses,  no organomegaly Extremities- no edema Lymph-no adenopathy palpable Musculoskeletal-no joint tenderness, deformity or swelling Skin-warm and dry, no hyperpigmentation, vitiligo, or suspicious lesions  Neurological Examination   Mental Status: Alert, oriented, thought content appropriate.  Speech fluent without evidence of aphasia.  Able to follow 3 step commands without difficulty. Cranial Nerves: II: Discs flat bilaterally; Visual fields grossly normal, pupils equal, round, reactive to light and accommodation III,IV, VI: ptosis not present, extra-ocular motions intact bilaterally V,VII: smile symmetric, facial light touch sensation normal bilaterally VIII: hearing normal bilaterally IX,X: gag reflex present XI: bilateral  shoulder shrug XII: midline tongue extension Motor: Right : Upper extremity   5-/5 with pronator drif  Left:     Upper extremity   5/5  Lower extremity   5/5      Lower extremity   5/5 Tone and bulk:normal tone throughout; no atrophy noted Sensory: Pinprick and light touch intact throughout, bilaterally Deep Tendon Reflexes: Symmetric throughout Plantars: Right: mute   Left: mute Cerebellar: Mild dysmetria with finger-to-nose and heel-to-shin testing on the right Gait: not tested due to safety concerns    Laboratory Studies:  Basic Metabolic Panel: Recent Labs  Lab 08/06/19 1149  NA 138  K 4.3  CL 103  CO2 25  GLUCOSE 93  BUN 13  CREATININE 0.69  CALCIUM 9.0    Liver Function Tests: Recent Labs  Lab 08/06/19 1149  AST 23  ALT 24  ALKPHOS 46  BILITOT 0.6  PROT 7.2  ALBUMIN 4.5   No results for input(s): LIPASE, AMYLASE in the last 168 hours. No results for input(s): AMMONIA in the last 168 hours.  CBC: Recent Labs  Lab 08/06/19 1149  WBC 7.2  HGB 13.5  HCT 40.2  MCV 91.2  PLT 190    Cardiac Enzymes: No results for input(s): CKTOTAL, CKMB, CKMBINDEX, TROPONINI in the last 168 hours.  BNP: Invalid input(s): POCBNP  CBG:  Recent Labs  Lab 08/06/19 [id number]    Microbiology: Results for orders placed or performed during the hospital encounter of 05/25/18  Urine culture     Status: None   Collection Time: 05/25/18  9:05 AM   Specimen: Urine, Random  Result Value Ref Range Status   Specimen Description   Final    URINE, RANDOM Performed at [facility], [address] [place], [place] [address]    Special Requests   Final    Normal Performed at [facility], [address], [place], [place] [address]    Culture   Final    NO GROWTH Performed at [facility], [place] [address] [place], [place] [address]    Report Status 05/26/2018 FINAL  Final  Surgical pcr screen     Status: None   Collection Time: 05/25/18   9:05 AM   Specimen: Nasal Mucosa; Nasal Swab  Result Value Ref Range Status   MRSA, PCR NEGATIVE NEGATIVE Final   Staphylococcus aureus NEGATIVE NEGATIVE Final    Comment: (NOTE) The Xpert SA Assay (FDA approved for NASAL specimens in patients 73 years of age and older), is one component of a comprehensive surveillance program. It is not intended to diagnose infection nor to guide or monitor treatment. Performed at [facility], [address] [place], [place] [address]     Coagulation Studies: No results for input(s): LABPROT, INR in the last 72 hours.  Urinalysis: No results for input(s): COLORURINE, LABSPEC, PHURINE, GLUCOSEU, HGBUR, BILIRUBINUR, KETONESUR, PROTEINUR, UROBILINOGEN, NITRITE, LEUKOCYTESUR in the last 168 hours.  Invalid input(s): APPERANCEUR  Lipid Panel:    Component Value Date/Time   CHOL 165 01/10/2019 0821   TRIG 127 01/10/2019 0821   HDL 50 01/10/2019 0821   CHOLHDL 3.3 01/10/2019 0821   LDLCALC 90 01/10/2019 0821    HgbA1C: No results found for: HGBA1C  Urine Drug Screen:  No results found for: LABOPIA, COCAINSCRNUR, LABBENZ, AMPHETMU, THCU, LABBARB  Alcohol Level: No results for input(s): ETH in the last 168 hours.   Imaging: DG Chest 2 View  Result Date: 08/06/2019 CLINICAL DATA:  Dizziness EXAM: CHEST - 2 VIEW COMPARISON:  2015 FINDINGS: The heart size and mediastinal contours are within normal limits. Both lungs are clear. No pleural effusion. The visualized skeletal structures are unremarkable. IMPRESSION: No acute process in the chest. Electronically Signed   By: Macy Mis M.D.   On: 08/06/2019 10:32   CT Head Wo Contrast  Result Date: 08/06/2019 CLINICAL DATA:  Dizziness, upper extremity weakness EXAM: CT HEAD WITHOUT CONTRAST TECHNIQUE: Contiguous axial images were obtained from the base of the skull through the vertex without intravenous contrast. COMPARISON:  06/09/2018 FINDINGS: Brain: No evidence of acute infarction,  hemorrhage, hydrocephalus, extra-axial collection or mass lesion/mass effect. Vascular: No hyperdense vessel or unexpected calcification. Skull: Normal. Negative for fracture or focal lesion. Sinuses/Orbits: The visualized paranasal sinuses are essentially clear. The mastoid air cells are unopacified. Other: None. IMPRESSION: Normal head CT. Electronically Signed   By: Julian Hy M.D.   On: 08/06/2019 10:28   MR BRAIN WO CONTRAST  Result Date: 08/06/2019 CLINICAL DATA:  Dizziness EXAM: MRI HEAD WITHOUT CONTRAST TECHNIQUE: Multiplanar, multiecho pulse sequences of the brain and surrounding structures were obtained without intravenous contrast. COMPARISON:  Correlation made with prior CT imaging FINDINGS: Brain: There is a 1 cm focus of reduced diffusion at the left aspect of the pons. There is no evidence of intracranial hemorrhage. There  is no intracranial mass, mass effect, or edema. There is no hydrocephalus or extra-axial fluid collection. Vascular: Major vessel flow voids at the skull base are preserved. Skull and upper cervical spine: Normal marrow signal is preserved. Sinuses/Orbits: Minor mucosal thickening.  Left lens replacement. Other: Sella is unremarkable.  Mastoid air cells are clear. IMPRESSION: Small acute infarction of the left pons. Electronically Signed   By: Macy Mis M.D.   On: 08/06/2019 14:21   US Carotid Bilateral (at [facility] and [facility] only)  Result Date: 08/06/2019 CLINICAL DATA:  58 year old male with a history cerebrovascular accident EXAM: BILATERAL CAROTID DUPLEX ULTRASOUND TECHNIQUE: Pearline Cables scale imaging, color Doppler and duplex ultrasound were performed of bilateral carotid and vertebral arteries in the neck. COMPARISON:  None. FINDINGS: Criteria: Quantification of carotid stenosis is based on velocity parameters that correlate the residual internal carotid diameter with NASCET-based stenosis levels, using the diameter of the distal internal carotid lumen as the  denominator for stenosis measurement. The following velocity measurements were obtained: RIGHT ICA:  Systolic 80 cm/sec, Diastolic 32 cm/sec CCA:  123456 cm/sec SYSTOLIC ICA/CCA RATIO:  0.8 ECA:  77 cm/sec LEFT ICA:  Systolic 89 cm/sec, Diastolic 35 cm/sec CCA:  91 cm/sec SYSTOLIC ICA/CCA RATIO:  1.0 ECA:  100 cm/sec Right Brachial SBP: Not acquired Left Brachial SBP: Not acquired RIGHT CAROTID ARTERY: No significant calcified disease of the right common carotid artery. Intermediate waveform maintained. Heterogeneous plaque without significant calcifications at the right carotid bifurcation. Low resistance waveform of the right ICA. No significant tortuosity. RIGHT VERTEBRAL ARTERY: Antegrade flow with low resistance waveform. LEFT CAROTID ARTERY: No significant calcified disease of the left common carotid artery. Intermediate waveform maintained. Heterogeneous plaque at the left carotid bifurcation without significant calcifications. Low resistance waveform of the left ICA. LEFT VERTEBRAL ARTERY:  Antegrade flow with low resistance waveform. IMPRESSION: Color duplex indicates minimal heterogeneous plaque, with no hemodynamically significant stenosis by duplex criteria in the extracranial cerebrovascular circulation. Signed, Dulcy Fanny. Dellia Nims, RPVI [facility] Radiology Electronically Signed   By: Corrie Mckusick D.O.   On: 08/06/2019 16:00    Assessment: 58 y.o. male presenting with mild right sided weakness and dizziness.  MRI of the brain reviewed and shows an acute left pontine infarct.  Etiology likely small vessel disease.  Carotid dopplers show no evidence of hemodynamically significant stenosis.  Patient on no antiplatelet therapy prior to admission.  Further work up recommended.     Stroke Risk Factors - none  Plan: 1. HgbA1c, fasting lipid panel 2. PT consult, OT consult, Speech consult 3. Echocardiogram with bubble study 4. Prophylactic therapy-Dual  antiplatelet therapy with ASA 81mg  and Plavix 75mg  for three weeks with change to ASA 81mg  daily alone as monotherapy after that time. 5. NPO until RN stroke swallow screen 6. Telemetry monitoring 7. Frequent neuro checks 8. BP management with target BP<140/80    Alexis Goodell, MD Neurology [phone] 08/06/2019, 5:52 PM

## 2019-08-06 NOTE — ED Notes (Signed)
Lab called for hemolyzed specimens, requested assistance with lab draw

## 2019-08-06 NOTE — ED Provider Notes (Signed)
[facility] Emergency Department Provider Note  ____________________________________________   None    (approximate)   I have reviewed the triage vital signs and the nursing notes.   Patient has been triaged with a MSE exam performed by myself at a minimum. Based on symptoms and screening exam, patient may receive a more in-depth exam, labs, imaging as detailed below. Patients have been advised of this setting and exam type at the time of patient interview.    HISTORY  Chief Complaint Dizziness and Weakness    HPI Rodney Rojas is a 58 y.o. male that presents to the emergency department with a complaint of dizziness and weakness since Wednesday.  Patient describes the dizziness as feeling like he has had something to drink.  Patient states that this caused him to fall on Wednesday.  He did not lose consciousness.  It was noted after his fall, that his heart rate was in the 50s.  He is also noted some right arm and left arm weakness.  He did have a headache last week but this has resolved.  He did not come to the emergency department last week because he did not want to come over Christmas.  Patient does take propranolol for a benign tremor but has been taking the same dose for 3 years.  Patient will receive a medical screening exam as detailed below.  Based off of this exam, more in depth exam, labs, imaging will be performed as needed for complaint.  Patient care will be eventually transferred to another provider in the emergency department for final exam, diagnosis and disposition.    Past Medical History:  Diagnosis Date  . Arthritis   . Complication of anesthesia    slow to wake up after colonoscopy  . GERD (gastroesophageal reflux disease)   . Headache   . History of chicken pox   . History of kidney stones   . Prostate enlargement   . Tremors of nervous system     Patient Active Problem List   Diagnosis Date Noted  . Hypogonadism in male  01/24/2019  . S/P total knee arthroplasty 06/05/2018  . Primary osteoarthritis of left knee 07/21/2017  . Status post total right knee replacement 02/25/2016  . Chronic pain of left knee 02/24/2015  . GERD (gastroesophageal reflux disease) 02/24/2015  . Urinary hesitancy 02/24/2015  . Fam hx-ischem heart disease 09/01/2009  . Family history of prostate cancer 09/01/2009  . Umbilical hernia without obstruction and without gangrene 09/01/2009  . Benign essential tremor 01/14/2009  . Allergy to insects and arachnids 08/09/1998    Past Surgical History:  Procedure Laterality Date  . CARDIOVASCULAR STRESS TEST  12/28/2013   Normal; [facility]  . CATARACT EXTRACTION W/PHACO Left 10/11/2017   Procedure: CATARACT EXTRACTION PHACO AND INTRAOCULAR LENS PLACEMENT (IOC);  Surgeon: Birder Robson, MD;  Location: [facility] ORS;  Service: Ophthalmology;  Laterality: Left;  [place] 00:23.0 AP% 13.1 CDE 3.02 Fluid Pack Lot # [id number]  . CYSTOSCOPY  1994  . HERNIA REPAIR  0000000   umbilical hernia repair; Dr. Pat Patrick  . KNEE ARTHROPLASTY Right 02/25/2016   Procedure: COMPUTER ASSISTED TOTAL KNEE ARTHROPLASTY;  Surgeon: Dereck Leep, MD;  Location: [facility] ORS;  Service: Orthopedics;  Laterality: Right;  . KNEE ARTHROPLASTY Left 06/05/2018   Procedure: COMPUTER ASSISTED TOTAL KNEE ARTHROPLASTY;  Surgeon: Dereck Leep, MD;  Location: [facility] ORS;  Service: Orthopedics;  Laterality: Left;  . REPLACEMENT TOTAL KNEE Right   . VASECTOMY  2000  Prior to Admission medications   Medication Sig Start Date End Date Taking? Authorizing Provider  bisoprolol (ZEBETA) 10 MG tablet TAKE 1 TABLET(10 MG) BY MOUTH DAILY 01/11/19   Birdie Sons, MD  primidone (MYSOLINE) 50 MG tablet TAKE 1 TO 2 TABLETS(50 TO 100 MG) BY MOUTH AT BEDTIME 01/09/19   Birdie Sons, MD  tamsulosin (FLOMAX) 0.4 MG CAPS capsule Take 0.4 mg by mouth daily as needed (urination).  06/24/15   [provider]  testosterone (ANDRODERM) 4 MG/24HR  PT24 patch Place 1 patch onto the skin daily. 01/24/19   Birdie Sons, MD    Allergies Bee venom  Family History  Problem Relation Age of Onset  . Heart Problems Father        had 3V CABG in his 10's  . Diabetes Father   . Hypertension Father   . Cirrhosis Father     Social History Social History   Tobacco Use  . Smoking status: Never Smoker  . Smokeless tobacco: Never Used  Substance Use Topics  . Alcohol use: Yes    Alcohol/week: 1.0 standard drinks    Types: 1 Cans of beer per week    Comment: occasional use  . Drug use: No    Review of Systems Cardiovascular: No chest pain. Respiratory: No cough. No shortness of breath/difficulty breathing Gastroenterology: No abdominal pain Musculoskeletal: No for musculoskeletal pain Integumentary: Negative for rash. Neurological: Positive for right sided weakness. Positive for headache last week. Positive for dizziness.   ____________________________________________   PHYSICAL EXAM:  VITAL SIGNS: ED Triage Vitals [08/06/19 0955]  Enc Vitals Group     BP (!) 161/80     Pulse Rate (!) 58     Resp 18     Temp 98 F (36.7 C)     Temp Source Oral     SpO2 98 %     Weight 215 lb (97.5 kg)     Height 5\' 9"  (1.753 m)     Head Circumference      Peak Flow      Pain Score 0     Pain Loc      Pain Edu?      Excl. in [place]?     Constitutional: Alert and oriented. Generally well appearing and in no acute distress. Eyes: Conjunctivae are normal.  Cardiovascular: Grossly normal heart sounds. Respiratory: Normal respiratory effort without significant tachypnea and no observed retractions.  Gastrointestinal: No significant visible abdominal wall findings.  Musculoskeletal: No gross deformities of extremities. Neurologic:  Normal speech and language.  Skin:  Skin is warm, dry and intact. No rash noted.    ____________________________________________   LABS (all labs ordered are listed, but only abnormal results are  displayed)  Labs Reviewed  BASIC METABOLIC PANEL  CBC  URINALYSIS, COMPLETE (UACMP) WITH MICROSCOPIC  TSH  CBG MONITORING, ED  TROPONIN I (HIGH SENSITIVITY)    ____________________________________________   RADIOLOGY Robinette Haines, personally viewed and evaluated these images (plain radiographs) as part of my medical decision making, as well as reviewing the written report by the radiologist.  Official radiology report(s): CT Head Wo Contrast  Result Date: 08/06/2019 CLINICAL DATA:  Dizziness, upper extremity weakness EXAM: CT HEAD WITHOUT CONTRAST TECHNIQUE: Contiguous axial images were obtained from the base of the skull through the vertex without intravenous contrast. COMPARISON:  06/09/2018 FINDINGS: Brain: No evidence of acute infarction, hemorrhage, hydrocephalus, extra-axial collection or mass lesion/mass effect. Vascular: No hyperdense vessel or unexpected calcification. Skull: Normal.  Negative for fracture or focal lesion. Sinuses/Orbits: The visualized paranasal sinuses are essentially clear. The mastoid air cells are unopacified. Other: None. IMPRESSION: Normal head CT. Electronically Signed   By: Julian Hy M.D.   On: 08/06/2019 10:28    ____________________________________________    INITIAL IMPRESSION / MDM / ASSESSMENT AND PLAN / ED COURSE   Basic labs, head CT, chest x-ray ordered.     Patient has been screened based based on their arrival complaint, evaluated for an emergent condition, and at a minimum has received a medical screening exam.  At this time, patient will receive further work-up as determined by medical screening exam.  Patient care will eventually be transferred to another provider in the emergency department for final diagnosis and disposition.    ____________________________________________  Note:  This document was prepared using Systems analyst and may include unintentional dictation errors.   Laban Emperor,  PA-C 08/06/19 1457    Lavonia Drafts, MD 08/06/19 [phone]

## 2019-08-06 NOTE — ED Notes (Signed)
Patient taken sandwich tray. Given number for dietary to call for dinner tray.

## 2019-08-06 NOTE — ED Triage Notes (Signed)
Presents with some dizziness since last weds..stats he fell and hit head but denies any LOC  But has noticed right arm and leg weakness

## 2019-08-06 NOTE — ED Notes (Signed)
Neurology at bedside.

## 2019-08-06 NOTE — Telephone Encounter (Signed)
Pt and his wife called stating he is having dizziness, stabbing right arm, and sleeping 10 hrs/day and fatigue; his episode of dizziness was on 08/01/2019; he says his balance is off, especially with tasks involving his right side; recommendations made per nurse triage protocol; he verbalized understanding, but will have his wife drive him to the ED; the pt sees Dr Caryn Section, [facility]; will route to office for notification.  Reason for Disposition . [1] Numbness (i.e., loss of sensation) of the face, arm / hand, or leg / foot on one side of the body AND [2] sudden onset AND [3] present now  Answer Assessment - Initial Assessment Questions 1. SYMPTOM: "What is the main symptom you are concerned about?" (e.g., weakness, numbness)     Balance is off; especially with tasks his right side of his body 2. ONSET: "When did this start?" (minutes, hours, days; while sleeping)    08/01/2019 3. LAST NORMAL: "When was the last time you were normal (no symptoms)?"     08/01/2019 at 1600 4. PATTERN "Does this come and go, or has it been constant since it started?"  "Is it present now?"   Constant; yes still present 5. CARDIAC SYMPTOMS: "Have you had any of the following symptoms: chest pain, difficulty breathing, palpitations?"     no 6. NEUROLOGIC SYMPTOMS: "Have you had any of the following symptoms: headache, dizziness, vision loss, double vision, changes in speech, unsteady on your feet?"    Dizziness, stumbling 7. OTHER SYMPTOMS: "Do you have any other symptoms?"    Previously had stabbing pain in right arm 8. PREGNANCY: "Is there any chance you are pregnant?" "When was your last menstrual period?"     n/a  Protocols used: NEUROLOGIC DEFICIT-A-AH

## 2019-08-06 NOTE — Telephone Encounter (Signed)
FYI

## 2019-08-06 NOTE — ED Notes (Signed)
Pt transported to room 147 

## 2019-08-07 ENCOUNTER — Observation Stay
Admit: 2019-08-07 | Discharge: 2019-08-07 | Disposition: A | Discharge status: Home / Self Care | Payer: BC Managed Care – PPO | Attending: Internal Medicine | Admitting: Internal Medicine

## 2019-08-07 DIAGNOSIS — G250 Essential tremor: Secondary | ICD-10-CM

## 2019-08-07 DIAGNOSIS — E785 Hyperlipidemia, unspecified: Secondary | ICD-10-CM

## 2019-08-07 DIAGNOSIS — I10 Essential (primary) hypertension: Secondary | ICD-10-CM

## 2019-08-07 DIAGNOSIS — I6359 Cerebral infarction due to unspecified occlusion or stenosis of other cerebral artery: Secondary | ICD-10-CM | POA: Diagnosis not present

## 2019-08-07 DIAGNOSIS — I639 Cerebral infarction, unspecified: Secondary | ICD-10-CM | POA: Diagnosis not present

## 2019-08-07 LAB — HEMOGLOBIN A1C
Hgb A1c MFr Bld: 5 % (ref 4.8–5.6)
Mean Plasma Glucose: 96.8 mg/dL

## 2019-08-07 LAB — ECHOCARDIOGRAM COMPLETE
Height: 69 in
Weight: 3440 oz

## 2019-08-07 LAB — SARS CORONAVIRUS 2 (TAT 6-24 HRS): SARS Coronavirus 2: NEGATIVE

## 2019-08-07 LAB — LIPID PANEL
Cholesterol: 170 mg/dL (ref 0–200)
HDL: 40 mg/dL — ABNORMAL LOW (ref >40)
LDL Cholesterol: 90 mg/dL (ref 0–99)
Total CHOL/HDL Ratio: 4.3 RATIO
Triglycerides: 200 mg/dL — ABNORMAL HIGH (ref <150)
VLDL: 40 mg/dL (ref 0–40)

## 2019-08-07 MED ORDER — ATORVASTATIN CALCIUM 40 MG PO TABS: 40.0000 mg | ORAL_TABLET | Freq: Every day | ORAL | 0 refills | Status: DC

## 2019-08-07 MED ORDER — AMLODIPINE BESYLATE 5 MG PO TABS: 5.0000 mg | ORAL_TABLET | Freq: Every day | ORAL | 0 refills | Status: DC

## 2019-08-07 MED ORDER — ASPIRIN EC 81 MG PO TBEC: 81.0000 mg | DELAYED_RELEASE_TABLET | Freq: Every day | ORAL | 0 refills | Status: AC

## 2019-08-07 MED ORDER — CLOPIDOGREL BISULFATE 75 MG PO TABS: 75.0000 mg | ORAL_TABLET | Freq: Every day | ORAL | 0 refills | Status: DC

## 2019-08-07 MED ORDER — CLOPIDOGREL BISULFATE 75 MG PO TABS
75.0000 mg | ORAL_TABLET | Freq: Every day | ORAL | Status: DC
  Administered 2019-08-07: 09:00:00 75 mg via ORAL
  Filled 2019-08-07: qty 1

## 2019-08-07 NOTE — Progress Notes (Signed)
*  PRELIMINARY RESULTS* Echocardiogram 2D Echocardiogram has been performed.  Rodney Rojas 08/07/2019, 10:24 AM

## 2019-08-07 NOTE — Evaluation (Signed)
Occupational Therapy Evaluation Patient Details Name: Rodney Rojas MRN: [id number] DOB: 1960/09/10 Today's Date: 08/07/2019    History of Present Illness Pt is 58 y.o. male presenting with mild right sided weakness and dizziness.  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  PMH of benign essential tremor, HTN, L TKA, GERD,   Clinical Impression   Rodney Rojas was seen for OT evaluation this date. Prior to hospital admission, pt was independent in all aspects of ADL/IADL/functional mobility. He denies falls history in past 12 months. Pt lives with his spouse in a 1 story home with 3 steps to enter with R hand rail. Currently pt reporting symptoms have resolved. Pt demonstrates baseline independence to perform ADL and mobility tasks and no strength, sensory, cognitive, or visual deficits appreciated with assessment. Pt continues to endorse mild "tightness" in his R hand particularly with opposition, but states this has not affected his ability to perform ADL/IADL tasks. 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 PT return verbalizes understanding of education provided. [facility] HEP left with pt. No further skilled OT needs identified. Will sign off at this time. Please re-consult if additional OT needs arise during this admission.     Follow Up Recommendations  No OT follow up    Equipment Recommendations  None recommended by OT    Recommendations for Other Services       Precautions / Restrictions Precautions Precautions: Fall Restrictions Weight Bearing Restrictions: No      Mobility Bed Mobility Overal bed mobility: Independent                Transfers Overall transfer level: Independent               General transfer comment: Pt recieved up and ambulating in room w/o device.    Balance Overall  balance assessment: Independent Sitting-balance support: Feet supported Sitting balance-Leahy Scale: Normal Sitting balance - Comments: Steady static and dynamic sitting. No deficits appreciated with assessment.     Standing balance-Leahy Scale: Normal                   Standardized Balance Assessment Standardized Balance Assessment : Dynamic Gait Index   Dynamic Gait Index Level Surface: Normal Change in Gait Speed: Normal Gait with Horizontal Head Turns: Normal Gait with Vertical Head Turns: Normal Gait and Pivot Turn: Normal Step Over Obstacle: Normal Step Around Obstacles: Normal Steps: Mild Impairment(clinical judgement) Total Score: 23     ADL either performed or assessed with clinical judgement   ADL Overall ADL's : At baseline                                       General ADL Comments: Pt presents at or near baseline level of independence for functional tasks at time of OT evaluation.     Vision Baseline Vision/History: Wears glasses Wears Glasses: At all times Patient Visual Report: No change from baseline       Perception     Praxis      Pertinent Vitals/Pain Pain Assessment: No/denies pain     Hand Dominance Right   Extremity/Trunk Assessment Upper Extremity Assessment Upper Extremity Assessment: Overall WFL for tasks assessed;RUE deficits/detail(RUE strength, FMC, ROM all WFL. Marginal loss  of position with resistance applied to test pronation and shoulder flexion in RUE. LUE WNL t/o) RUE Deficits / Details: RUE grossly 4+/5 RUE Sensation: WNL RUE Coordination: decreased fine motor(Slight decrease as compared to LUE. WFL.) LUE Deficits / Details: 5/5 LUE Sensation: WNL LUE Coordination: WNL   Lower Extremity Assessment Lower Extremity Assessment: Overall WFL for tasks assessed;Defer to PT evaluation RLE Deficits / Details: pt reported pain with R hip flexion MMT, 4/5, grossly 5/5 for remaining MMT RLE Sensation: WNL RLE  Coordination: WNL LLE Deficits / Details: 5/5 LLE Sensation: WNL LLE Coordination: WNL   Cervical / Trunk Assessment Cervical / Trunk Assessment: Normal   Communication Communication Communication: No difficulties   Cognition Arousal/Alertness: Awake/alert Behavior During Therapy: WFL for tasks assessed/performed Overall Cognitive Status: Within Functional Limits for tasks assessed                                     General Comments       Exercises Other Exercises Other Exercises: Pt educated on HEP for RUE fine motor coordination and encouraged to 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 Pt identifies a vareity of activities to work on at home to support ongoing use of his RUE. Other Exercises: Pt educated on BE FAST stroke education and identification strategies for ongoing health management.   Shoulder Instructions      Home Living Family/patient expects to be discharged to:: Private residence Living Arrangements: Spouse/significant other Available Help at Discharge: Family;Available 24 hours/day Type of Home: House Home Access: Stairs to enter [employer] of Steps: 2-3 Entrance Stairs-Rails: Right Home Layout: One level     Bathroom Shower/Tub: Corporate investment banker: Handicapped height Bathroom Accessibility: Yes How Accessible: Accessible via walker Home Equipment: None          Prior Functioning/Environment Level of Independence: Independent        Comments: employed, driving, active individual        OT Problem List: Decreased strength;Decreased coordination;Decreased knowledge of use of DME or AE;Decreased safety awareness      OT Treatment/Interventions:      OT Goals(Current goals can be found in the care plan section) Acute Rehab OT Goals Patient Stated Goal: To go home OT Goal Formulation: All assessment and education complete, DC therapy Time For  Goal Achievement: 08/07/19 Potential to Achieve Goals: Good  OT Frequency:     Barriers to D/C:            Co-evaluation              AM-PAC OT "6 Clicks" Daily Activity     Outcome Measure Help from another person eating meals?: None Help from another person taking care of personal grooming?: None Help from another person toileting, which includes using toliet, bedpan, or urinal?: None Help from another person bathing (including washing, rinsing, drying)?: None Help from another person to put on and taking off regular upper body clothing?: None Help from another person to put on and taking off regular lower body clothing?: None 6 Click Score: 24   End of Session    Activity Tolerance: Patient tolerated treatment well Patient left: in chair;with call bell/phone within reach  OT Visit Diagnosis: Other abnormalities of gait and mobility (R26.89);Hemiplegia and hemiparesis Hemiplegia - Right/Left: Right Hemiplegia - dominant/non-dominant: Dominant Hemiplegia - caused by: Cerebral infarction  Time: [id number] OT Time Calculation (min): 18 min Charges:  OT General Charges $OT Visit: 1 Visit OT Evaluation $OT Eval Low Complexity: 1 Low OT Treatments $Self Care/Home Management : 8-22 mins  Shara Blazing, M.S., OTR/L Ascom: [phone] 08/07/19, 11:04 AM

## 2019-08-07 NOTE — Discharge Instructions (Signed)
Can take plavix for one month then stop and just take aspirin afterwards

## 2019-08-07 NOTE — Progress Notes (Signed)
Receive MD order to discharge patient to home, reviewed homes meds, prescriptions and follow up appointments with patient and patient verbalized understanding

## 2019-08-07 NOTE — Discharge Summary (Signed)
[facility] [facility] [place] NAME: Rodney Rojas    MR#:  [id number]  DATE OF BIRTH:  10/23/1960  DATE OF ADMISSION:  08/06/2019 ADMITTING PHYSICIAN: Sidney Ace, MD  DATE OF DISCHARGE: 08/07/2019  PRIMARY CARE PHYSICIAN: Birdie Sons, MD    ADMISSION DIAGNOSIS:  CVA (cerebral vascular accident) [facility]) [I63.9] Cerebrovascular accident (CVA), unspecified mechanism ([place]) [I63.9]  DISCHARGE DIAGNOSIS:  Active Problems:   CVA (cerebral vascular accident) ([place])   SECONDARY DIAGNOSIS:   Past Medical History:  Diagnosis Date  . Arthritis   . Complication of anesthesia    slow to wake up after colonoscopy  . GERD (gastroesophageal reflux disease)   . Headache   . History of chicken pox   . History of kidney stones   . Prostate enlargement   . Tremors of nervous system     HOSPITAL COURSE:   1.  Acute stroke in the left pons affecting right-sided coordination.  Patient states that symptoms started on 08/01/2019.  Patient symptoms have improved since yesterday when he came into the hospital.  Patient actually did very well with physical therapy and Occupational Therapy and they recommended no further therapy.  Patient was seen by neurology and recommended aspirin 81 mg daily plus Plavix for short course 3 to 4 weeks then aspirin monotherapy after that.  Lipitor prescribed.  Goal LDL less than 70.  Patient's LDL 90. 2.  Essential hypertension.  His bisoprolol was changed over to Norvasc secondary to bradycardia. 3.  Essential tremor on primidone 4.  Hyperlipidemia.  Patient started on Lipitor.  DISCHARGE CONDITIONS:   Satisfactory  CONSULTS OBTAINED:  Treatment Team:  Catarina Hartshorn, MD  DRUG ALLERGIES:   Allergies  Allergen Reactions  . Bee Venom Anaphylaxis, Itching and Other (See Comments)    Bee stings    DISCHARGE MEDICATIONS:   Allergies as of 08/07/2019      Reactions   Bee Venom Anaphylaxis, Itching, Other  (See Comments)   Bee stings      Medication List    STOP taking these medications   bisoprolol 10 MG tablet Commonly known as: ZEBETA     TAKE these medications   amLODipine 5 MG tablet Commonly known as: NORVASC Take 1 tablet (5 mg total) by mouth daily. Start taking on: August 08, 2019   aspirin EC 81 MG tablet Take 1 tablet (81 mg total) by mouth daily.   atorvastatin 40 MG tablet Commonly known as: Lipitor Take 1 tablet (40 mg total) by mouth daily.   clopidogrel 75 MG tablet Commonly known as: PLAVIX Take 1 tablet (75 mg total) by mouth daily. Start taking on: August 08, 2019   primidone 50 MG tablet Commonly known as: MYSOLINE TAKE 1 TO 2 TABLETS(50 TO 100 MG) BY MOUTH AT BEDTIME        DISCHARGE INSTRUCTIONS:   Follow-up PMD 5 days  If you experience worsening of your admission symptoms, develop shortness of breath, life threatening emergency, suicidal or homicidal thoughts you must seek medical attention immediately by calling 911 or calling your MD immediately  if symptoms less severe.  You Must read complete instructions/literature along with all the possible adverse reactions/side effects for all the Medicines you take and that have been prescribed to you. Take any new Medicines after you have completely understood and accept all the possible adverse reactions/side effects.   Please note  You were cared for by a hospitalist during your hospital stay. If you  have any questions about your discharge medications or the care you received while you were in the hospital after you are discharged, you can call the unit and asked to speak with the hospitalist on call if the hospitalist that took care of you is not available. Once you are discharged, your primary care physician will handle any further medical issues. Please note that NO REFILLS for any discharge medications will be authorized once you are discharged, as it is imperative that you return to your  primary care physician (or establish a relationship with a primary care physician if you do not have one) for your aftercare needs so that they can reassess your need for medications and monitor your lab values.    Today   CHIEF COMPLAINT:   Chief Complaint  Patient presents with  . Dizziness  . Weakness    HISTORY OF PRESENT ILLNESS:  Rodney Rojas  is a 58 y.o. male came in with dizziness and right-sided incoordination   VITAL SIGNS:  Blood pressure (!) 159/81, pulse (!) 54, temperature (!) 97.5 F (36.4 C), temperature source Oral, resp. rate 16, height 5\' 9"  (1.753 m), weight 97.5 kg, SpO2 100 %.    PHYSICAL EXAMINATION:  GENERAL:  57 y.o.-year-old patient lying in the bed with no acute distress.  EYES: Pupils equal, round, reactive to light and accommodation. No scleral icterus. Extraocular muscles intact.  HEENT: Head atraumatic, normocephalic. Oropharynx and nasopharynx clear.  NECK:  Supple, no jugular venous distention. No thyroid enlargement, no tenderness.  LUNGS: Normal breath sounds bilaterally, no wheezing, rales,rhonchi or crepitation. No use of accessory muscles of respiration.  CARDIOVASCULAR: S1, S2 normal. No murmurs, rubs, or gallops.  ABDOMEN: Soft, non-tender, non-distended. Bowel sounds present. No organomegaly or mass.  EXTREMITIES: No pedal edema, cyanosis, or clubbing.  NEUROLOGIC: Cranial nerves II through XII are intact. Muscle strength 5/5 in all extremities. Sensation intact. Gait not checked.  Rapid finger movements with the right hand is slower than the left hand.  Finger-to-nose intact bilaterally.  Heel shin intact bilaterally. PSYCHIATRIC: The patient is alert and oriented x 3.  SKIN: No obvious rash, lesion, or ulcer.   DATA REVIEW:   CBC Recent Labs  Lab 08/06/19 1149  WBC 7.2  HGB 13.5  HCT 40.2  PLT 190    Chemistries  Recent Labs  Lab 08/06/19 1149  NA 138  K 4.3  CL 103  CO2 25  GLUCOSE 93  BUN 13  CREATININE 0.69   CALCIUM 9.0  AST 23  ALT 24  ALKPHOS 46  BILITOT 0.6     Microbiology Results  Results for orders placed or performed during the hospital encounter of 08/06/19  SARS CORONAVIRUS 2 (TAT 6-24 HRS) Nasopharyngeal Nasopharyngeal Swab     Status: None   Collection Time: 08/06/19  3:55 PM   Specimen: Nasopharyngeal Swab  Result Value Ref Range Status   SARS Coronavirus 2 NEGATIVE NEGATIVE Final    Comment: (NOTE) SARS-CoV-2 target nucleic acids are NOT DETECTED. The SARS-CoV-2 RNA is generally detectable in upper and lower respiratory specimens during the acute phase of infection. Negative results do not preclude SARS-CoV-2 infection, do not rule out co-infections with other pathogens, and should not be used as the sole basis for treatment or other patient management decisions. Negative results must be combined with clinical observations, patient history, and epidemiological information. The expected result is Negative. Fact Sheet for Patients: SugarRoll.be Fact Sheet for Healthcare Providers: https://www.woods-mathews.com/ This test is not yet approved or  cleared by the [place] and  has been authorized for detection and/or diagnosis of SARS-CoV-2 by FDA under an Emergency Use Authorization (EUA). This EUA will remain  in effect (meaning this test can be used) for the duration of the COVID-19 declaration under Section 56 4(b)(1) of the Act, 21 U.S.C. section 360bbb-3(b)(1), unless the authorization is terminated or revoked sooner. Performed at [facility], [place] [address] [place], [place] [address]     RADIOLOGY:  DG Chest 2 View  Result Date: 08/06/2019 CLINICAL DATA:  Dizziness EXAM: CHEST - 2 VIEW COMPARISON:  2015 FINDINGS: The heart size and mediastinal contours are within normal limits. Both lungs are clear. No pleural effusion. The visualized skeletal structures are unremarkable. IMPRESSION: No acute process in  the chest. Electronically Signed   By: Macy Mis M.D.   On: 08/06/2019 10:32   CT Head Wo Contrast  Result Date: 08/06/2019 CLINICAL DATA:  Dizziness, upper extremity weakness EXAM: CT HEAD WITHOUT CONTRAST TECHNIQUE: Contiguous axial images were obtained from the base of the skull through the vertex without intravenous contrast. COMPARISON:  06/09/2018 FINDINGS: Brain: No evidence of acute infarction, hemorrhage, hydrocephalus, extra-axial collection or mass lesion/mass effect. Vascular: No hyperdense vessel or unexpected calcification. Skull: Normal. Negative for fracture or focal lesion. Sinuses/Orbits: The visualized paranasal sinuses are essentially clear. The mastoid air cells are unopacified. Other: None. IMPRESSION: Normal head CT. Electronically Signed   By: Julian Hy M.D.   On: 08/06/2019 10:28   MR BRAIN WO CONTRAST  Result Date: 08/06/2019 CLINICAL DATA:  Dizziness EXAM: MRI HEAD WITHOUT CONTRAST TECHNIQUE: Multiplanar, multiecho pulse sequences of the brain and surrounding structures were obtained without intravenous contrast. COMPARISON:  Correlation made with prior CT imaging FINDINGS: Brain: There is a 1 cm focus of reduced diffusion at the left aspect of the pons. There is no evidence of intracranial hemorrhage. There is no intracranial mass, mass effect, or edema. There is no hydrocephalus or extra-axial fluid collection. Vascular: Major vessel flow voids at the skull base are preserved. Skull and upper cervical spine: Normal marrow signal is preserved. Sinuses/Orbits: Minor mucosal thickening.  Left lens replacement. Other: Sella is unremarkable.  Mastoid air cells are clear. IMPRESSION: Small acute infarction of the left pons. Electronically Signed   By: Macy Mis M.D.   On: 08/06/2019 14:21   US Carotid Bilateral (at [facility] and [facility] only)  Result Date: 08/06/2019 CLINICAL DATA:  58 year old male with a history cerebrovascular accident EXAM: BILATERAL CAROTID DUPLEX  ULTRASOUND TECHNIQUE: Pearline Cables scale imaging, color Doppler and duplex ultrasound were performed of bilateral carotid and vertebral arteries in the neck. COMPARISON:  None. FINDINGS: Criteria: Quantification of carotid stenosis is based on velocity parameters that correlate the residual internal carotid diameter with NASCET-based stenosis levels, using the diameter of the distal internal carotid lumen as the denominator for stenosis measurement. The following velocity measurements were obtained: RIGHT ICA:  Systolic 80 cm/sec, Diastolic 32 cm/sec CCA:  123456 cm/sec SYSTOLIC ICA/CCA RATIO:  0.8 ECA:  77 cm/sec LEFT ICA:  Systolic 89 cm/sec, Diastolic 35 cm/sec CCA:  91 cm/sec SYSTOLIC ICA/CCA RATIO:  1.0 ECA:  100 cm/sec Right Brachial SBP: Not acquired Left Brachial SBP: Not acquired RIGHT CAROTID ARTERY: No significant calcified disease of the right common carotid artery. Intermediate waveform maintained. Heterogeneous plaque without significant calcifications at the right carotid bifurcation. Low resistance waveform of the right ICA. No significant tortuosity. RIGHT VERTEBRAL ARTERY: Antegrade flow with low resistance waveform. LEFT CAROTID ARTERY:  No significant calcified disease of the left common carotid artery. Intermediate waveform maintained. Heterogeneous plaque at the left carotid bifurcation without significant calcifications. Low resistance waveform of the left ICA. LEFT VERTEBRAL ARTERY:  Antegrade flow with low resistance waveform. IMPRESSION: Color duplex indicates minimal heterogeneous plaque, with no hemodynamically significant stenosis by duplex criteria in the extracranial cerebrovascular circulation. Signed, Dulcy Fanny. Dellia Nims, RPVI [facility] Rad[facility] Electronically Signed   By: Corrie Mckusick D.O.   On: 08/06/2019 16:00   ECHOCARDIOGRAM COMPLETE  Result Date: 08/07/2019   ECHOCARDIOGRAM REPORT   Patient Name:   CONSTANTINE AULET Date of Exam:  08/07/2019 Medical Rec #:  [id number]        Height:       69.0 in Accession #:    [id number]       Weight:       215.0 lb Date of Birth:  16-Dec-1960        BSA:          2.13 m Patient Age:    72 years         BP:           147/91 mmHg Patient Gender: M                HR:           54 bpm. Exam Location:  [facility] Procedure: 2D Echo, Color Doppler and Cardiac Doppler Indications:     I163.9 Stroke  History:         Patient has no prior history of Echocardiogram examinations.                  Headache.  Sonographer:     Charmayne Sheer RDCS (AE) Referring Phys:  [id number] Sidney Ace Diagnosing Phys: Serafina Royals MD  Sonographer Comments: Suboptimal subcostal window. IMPRESSIONS  1. Left ventricular ejection fraction, by visual estimation, is 55 to 60%. The left ventricle has normal function. There is no left ventricular hypertrophy.  2. The left ventricle has no regional wall motion abnormalities.  3. Global right ventricle has normal systolic function.The right ventricular size is normal. No increase in right ventricular wall thickness.  4. Left atrial size was normal.  5. Right atrial size was normal.  6. The mitral valve is normal in structure. Trivial mitral valve regurgitation.  7. The tricuspid valve is normal in structure.  8. The aortic valve is normal in structure. Aortic valve regurgitation is trivial.  9. The pulmonic valve was normal in structure. Pulmonic valve regurgitation is not visualized. FINDINGS  Left Ventricle: Left ventricular ejection fraction, by visual estimation, is 55 to 60%. The left ventricle has normal function. The left ventricle has no regional wall motion abnormalities. There is no left ventricular hypertrophy. Right Ventricle: The right ventricular size is normal. No increase in right ventricular wall thickness. Global RV systolic function is has normal systolic function. Left Atrium: Left atrial size was normal in size. Right Atrium: Right atrial size was normal in size Pericardium:  There is no evidence of pericardial effusion. Mitral Valve: The mitral valve is normal in structure. Trivial mitral valve regurgitation. MV peak gradient, 2.3 mmHg. Tricuspid Valve: The tricuspid valve is normal in structure. Tricuspid valve regurgitation is trivial. Aortic Valve: The aortic valve is normal in structure. Aortic valve regurgitation is trivial. Aortic valve mean gradient measures 4.0 mmHg. Aortic valve peak gradient measures 9.2 mmHg. Aortic valve area, by VTI measures 2.56 cm. Pulmonic  Valve: The pulmonic valve was normal in structure. Pulmonic valve regurgitation is not visualized. Pulmonic regurgitation is not visualized. Aorta: The aortic root, ascending aorta and aortic arch are all structurally normal, with no evidence of dilitation or obstruction. IAS/Shunts: No atrial level shunt detected by color flow Doppler.  LEFT VENTRICLE PLAX 2D LVIDd:         4.93 cm       Diastology LVIDs:         3.53 cm       LV e' lateral:   9.36 cm/s LV PW:         1.21 cm       LV E/e' lateral: 7.7 LV IVS:        1.02 cm       LV e' medial:    6.96 cm/s LVOT diam:     2.10 cm       LV E/e' medial:  10.3 LV SV:         63 ml LV SV Index:   28.30 LVOT Area:     3.46 cm  LV Volumes (MOD) LV area d, A4C:    36.70 cm LV area s, A4C:    22.00 cm LV major d, A4C:   8.31 cm LV major s, A4C:   6.98 cm LV vol d, MOD A4C: 132.0 ml LV vol s, MOD A4C: 58.0 ml LV SV MOD A4C:     132.0 ml LEFT ATRIUM             Index LA diam:        3.90 cm 1.83 cm/m LA Vol (A2C):   71.0 ml 33.33 ml/m LA Vol (A4C):   56.0 ml 26.29 ml/m LA Biplane Vol: 63.8 ml 29.95 ml/m  AORTIC VALVE                   PULMONIC VALVE AV Area (Vmax):    2.62 cm    PV Vmax:       1.52 m/s AV Area (Vmean):   2.73 cm    PV Vmean:      89.500 cm/s AV Area (VTI):     2.56 cm    PV VTI:        0.330 m AV Vmax:           152.00 cm/s PV Peak grad:  9.2 mmHg AV Vmean:          95.300 cm/s PV Mean grad:  4.0 mmHg AV VTI:            0.317 m AV Peak Grad:      9.2  mmHg AV Mean Grad:      4.0 mmHg LVOT Vmax:         115.00 cm/s LVOT Vmean:        75.000 cm/s LVOT VTI:          0.234 m LVOT/AV VTI ratio: 0.74  AORTA Ao Root diam: 3.40 cm MITRAL VALVE MV Area (PHT): 2.69 cm             SHUNTS MV Peak grad:  2.3 mmHg             Systemic VTI:  0.23 m MV Mean grad:  1.0 mmHg             Systemic Diam: 2.10 cm MV Vmax:       0.76 m/s MV Vmean:      45.4 cm/s MV VTI:  0.28 m MV PHT:        81.78 msec MV Decel Time: 282 msec MV E velocity: 71.80 cm/s 103 cm/s MV A velocity: 69.80 cm/s 70.3 cm/s MV E/A ratio:  1.03       1.5  Serafina Royals MD Electronically signed by Serafina Royals MD Signature Date/Time: 08/07/2019/2:23:51 PM    Final     Management plans discussed with the patient, family and they are in agreement.  CODE STATUS:     Code Status Orders  (From admission, onward)         Start     Ordered   08/06/19 1502  Full code  Continuous     08/06/19 1504        Code Status History    Date Active Date Inactive Code Status Order ID Comments User Context   06/05/2018 1638 06/07/2018 1524 Full Code [id number]  Dereck Leep, MD Inpatient   02/25/2016 1616 02/27/2016 1959 Full Code [id number]  Dereck Leep, MD Inpatient   Advance Care Planning Activity    Advance Directive Documentation     Most Recent Value  Type of Advance Directive  Healthcare Power of Attorney  Pre-existing out of facility DNR order (yellow form or pink MOST form)  --  "MOST" Form in Place?  --      TOTAL TIME TAKING CARE OF THIS PATIENT: 34 minutes.    Loletha Grayer M.D on 08/07/2019 at 2:50 PM  Between 7am to 6pm - Pager - [phone]  After 6pm go to [email or web address] - password [email or web address]  [facility]  CC: Primary care physician; Birdie Sons, MD

## 2019-08-07 NOTE — Progress Notes (Addendum)
Subjective: No new neurological complaints.    Objective: Current vital signs: BP (!) 159/81 (BP Location: Left Arm)   Pulse (!) 54   Temp (!) 97.5 F (36.4 C) (Oral)   Resp 16   Ht 5\' 9"  (1.753 m)   Wt 97.5 kg   SpO2 100%   BMI 31.75 kg/m  Vital signs in last 24 hours: Temp:  [97.5 F (36.4 C)-98.3 F (36.8 C)] 97.5 F (36.4 C) (12/29 1201) Pulse Rate:  [50-56] 54 (12/29 1201) Resp:  [16-18] 16 (12/29 0641) BP: (122-162)/(67-91) 159/81 (12/29 1201) SpO2:  [96 %-100 %] 100 % (12/29 1201)  Intake/Output from previous day: 12/28 0701 - 12/29 0700 In: 1553.6 [P.O.:480; I.V.:1073.6] Out: -  Intake/Output this shift: Total I/O In: 480 [P.O.:480] Out: -  Nutritional status:  Diet Order            Diet Heart Room service appropriate? Yes; Fluid consistency: Thin  Diet effective now              Neurologic Exam: Mental Status: Alert, oriented, thought content appropriate.  Speech fluent without evidence of aphasia.  Able to follow 3 step commands without difficulty. Cranial Nerves: II: Discs flat bilaterally; Visual fields grossly normal, pupils equal, round, reactive to light and accommodation III,IV, VI: ptosis not present, extra-ocular motions intact bilaterally V,VII: smile symmetric, facial light touch sensation normal bilaterally VIII: hearing normal bilaterally IX,X: gag reflex present XI: bilateral shoulder shrug XII: midline tongue extension Motor: Right :  Upper extremity   5-/5 with pronator drif                     Left:     Upper extremity   5/5             Lower extremity   5/5                                                              Lower extremity   5/5 Tone and bulk:normal tone throughout; no atrophy noted Sensory: Pinprick and light touch intact throughout, bilaterally   Lab Results: Basic Metabolic Panel: Recent Labs  Lab 08/06/19 1149  NA 138  K 4.3  CL 103  CO2 25  GLUCOSE 93  BUN 13  CREATININE 0.69  CALCIUM 9.0    Liver  Function Tests: Recent Labs  Lab 08/06/19 1149  AST 23  ALT 24  ALKPHOS 46  BILITOT 0.6  PROT 7.2  ALBUMIN 4.5   No results for input(s): LIPASE, AMYLASE in the last 168 hours. No results for input(s): AMMONIA in the last 168 hours.  CBC: Recent Labs  Lab 08/06/19 1149  WBC 7.2  HGB 13.5  HCT 40.2  MCV 91.2  PLT 190    Cardiac Enzymes: No results for input(s): CKTOTAL, CKMB, CKMBINDEX, TROPONINI in the last 168 hours.  Lipid Panel: Recent Labs  Lab 08/07/19 0511  CHOL 170  TRIG 200*  HDL 40*  CHOLHDL 4.3  VLDL 40  LDLCALC 90    CBG: Recent Labs  Lab 08/06/19 1229  GLUCAP 91    Microbiology: Results for orders placed or performed during the hospital encounter of 08/06/19  SARS CORONAVIRUS 2 (TAT 6-24 HRS) Nasopharyngeal Nasopharyngeal Swab     Status: None  Collection Time: 08/06/19  3:55 PM   Specimen: Nasopharyngeal Swab  Result Value Ref Range Status   SARS Coronavirus 2 NEGATIVE NEGATIVE Final    Comment: (NOTE) SARS-CoV-2 target nucleic acids are NOT DETECTED. The SARS-CoV-2 RNA is generally detectable in upper and lower respiratory specimens during the acute phase of infection. Negative results do not preclude SARS-CoV-2 infection, do not rule out co-infections with other pathogens, and should not be used as the sole basis for treatment or other patient management decisions. Negative results must be combined with clinical observations, patient history, and epidemiological information. The expected result is Negative. Fact Sheet for Patients: SugarRoll.be Fact Sheet for Healthcare Providers: https://www.woods-mathews.com/ This test is not yet approved or cleared by the [place] FDA and  has been authorized for detection and/or diagnosis of SARS-CoV-2 by FDA under an Emergency Use Authorization (EUA). This EUA will remain  in effect (meaning this test can be used) for the duration of the COVID-19  declaration under Section 56 4(b)(1) of the Act, 21 U.S.C. section 360bbb-3(b)(1), unless the authorization is terminated or revoked sooner. Performed at [facility], [place] [address], [place], [place] [address]     Coagulation Studies: No results for input(s): LABPROT, INR in the last 72 hours.  Imaging: DG Chest 2 View  Result Date: 08/06/2019 CLINICAL DATA:  Dizziness EXAM: CHEST - 2 VIEW COMPARISON:  2015 FINDINGS: The heart size and mediastinal contours are within normal limits. Both lungs are clear. No pleural effusion. The visualized skeletal structures are unremarkable. IMPRESSION: No acute process in the chest. Electronically Signed   By: Macy Mis M.D.   On: 08/06/2019 10:32   CT Head Wo Contrast  Result Date: 08/06/2019 CLINICAL DATA:  Dizziness, upper extremity weakness EXAM: CT HEAD WITHOUT CONTRAST TECHNIQUE: Contiguous axial images were obtained from the base of the skull through the vertex without intravenous contrast. COMPARISON:  06/09/2018 FINDINGS: Brain: No evidence of acute infarction, hemorrhage, hydrocephalus, extra-axial collection or mass lesion/mass effect. Vascular: No hyperdense vessel or unexpected calcification. Skull: Normal. Negative for fracture or focal lesion. Sinuses/Orbits: The visualized paranasal sinuses are essentially clear. The mastoid air cells are unopacified. Other: None. IMPRESSION: Normal head CT. Electronically Signed   By: Julian Hy M.D.   On: 08/06/2019 10:28   MR BRAIN WO CONTRAST  Result Date: 08/06/2019 CLINICAL DATA:  Dizziness EXAM: MRI HEAD WITHOUT CONTRAST TECHNIQUE: Multiplanar, multiecho pulse sequences of the brain and surrounding structures were obtained without intravenous contrast. COMPARISON:  Correlation made with prior CT imaging FINDINGS: Brain: There is a 1 cm focus of reduced diffusion at the left aspect of the pons. There is no evidence of intracranial hemorrhage. There is no intracranial mass, mass  effect, or edema. There is no hydrocephalus or extra-axial fluid collection. Vascular: Major vessel flow voids at the skull base are preserved. Skull and upper cervical spine: Normal marrow signal is preserved. Sinuses/Orbits: Minor mucosal thickening.  Left lens replacement. Other: Sella is unremarkable.  Mastoid air cells are clear. IMPRESSION: Small acute infarction of the left pons. Electronically Signed   By: Macy Mis M.D.   On: 08/06/2019 14:21   US Carotid Bilateral (at [facility] and [facility] only)  Result Date: 08/06/2019 CLINICAL DATA:  58 year old male with a history cerebrovascular accident EXAM: BILATERAL CAROTID DUPLEX ULTRASOUND TECHNIQUE: Pearline Cables scale imaging, color Doppler and duplex ultrasound were performed of bilateral carotid and vertebral arteries in the neck. COMPARISON:  None. FINDINGS: Criteria: Quantification of carotid stenosis is based on velocity  parameters that correlate the residual internal carotid diameter with NASCET-based stenosis levels, using the diameter of the distal internal carotid lumen as the denominator for stenosis measurement. The following velocity measurements were obtained: RIGHT ICA:  Systolic 80 cm/sec, Diastolic 32 cm/sec CCA:  123456 cm/sec SYSTOLIC ICA/CCA RATIO:  0.8 ECA:  77 cm/sec LEFT ICA:  Systolic 89 cm/sec, Diastolic 35 cm/sec CCA:  91 cm/sec SYSTOLIC ICA/CCA RATIO:  1.0 ECA:  100 cm/sec Right Brachial SBP: Not acquired Left Brachial SBP: Not acquired RIGHT CAROTID ARTERY: No significant calcified disease of the right common carotid artery. Intermediate waveform maintained. Heterogeneous plaque without significant calcifications at the right carotid bifurcation. Low resistance waveform of the right ICA. No significant tortuosity. RIGHT VERTEBRAL ARTERY: Antegrade flow with low resistance waveform. LEFT CAROTID ARTERY: No significant calcified disease of the left common carotid artery. Intermediate waveform maintained. Heterogeneous plaque at the left carotid  bifurcation without significant calcifications. Low resistance waveform of the left ICA. LEFT VERTEBRAL ARTERY:  Antegrade flow with low resistance waveform. IMPRESSION: Color duplex indicates minimal heterogeneous plaque, with no hemodynamically significant stenosis by duplex criteria in the extracranial cerebrovascular circulation. Signed, Dulcy Fanny. Dellia Nims, RPVI [facility] Electronically Signed   By: Corrie Mckusick D.O.   On: 08/06/2019 16:00   ECHOCARDIOGRAM COMPLETE  Result Date: 08/07/2019   ECHOCARDIOGRAM REPORT   Patient Name:   Rodney Rojas Date of Exam: 08/07/2019 Medical Rec #:  [id number]        Height:       69.0 in Accession #:    [id number]       Weight:       215.0 lb Date of Birth:  07-31-1961        BSA:          2.13 m Patient Age:    58 years         BP:           147/91 mmHg Patient Gender: M                HR:           54 bpm. Exam Location:  [facility] Procedure: 2D Echo, Color Doppler and Cardiac Doppler Indications:     I163.9 Stroke  History:         Patient has no prior history of Echocardiogram examinations.                  Headache.  Sonographer:     Charmayne Sheer RDCS (AE) Referring Phys:  [id number] Sidney Ace Diagnosing Phys: Serafina Royals MD  Sonographer Comments: Suboptimal subcostal window. IMPRESSIONS  1. Left ventricular ejection fraction, by visual estimation, is 55 to 60%. The left ventricle has normal function. There is no left ventricular hypertrophy.  2. The left ventricle has no regional wall motion abnormalities.  3. Global right ventricle has normal systolic function.The right ventricular size is normal. No increase in right ventricular wall thickness.  4. Left atrial size was normal.  5. Right atrial size was normal.  6. The mitral valve is normal in structure. Trivial mitral valve regurgitation.  7. The tricuspid valve is normal in structure.  8. The aortic valve is normal in structure. Aortic valve  regurgitation is trivial.  9. The pulmonic valve was normal in structure. Pulmonic valve regurgitation is not visualized. FINDINGS  Left Ventricle: Left ventricular ejection fraction, by visual estimation, is 55 to 60%. The left ventricle has normal  function. The left ventricle has no regional wall motion abnormalities. There is no left ventricular hypertrophy. Right Ventricle: The right ventricular size is normal. No increase in right ventricular wall thickness. Global RV systolic function is has normal systolic function. Left Atrium: Left atrial size was normal in size. Right Atrium: Right atrial size was normal in size Pericardium: There is no evidence of pericardial effusion. Mitral Valve: The mitral valve is normal in structure. Trivial mitral valve regurgitation. MV peak gradient, 2.3 mmHg. Tricuspid Valve: The tricuspid valve is normal in structure. Tricuspid valve regurgitation is trivial. Aortic Valve: The aortic valve is normal in structure. Aortic valve regurgitation is trivial. Aortic valve mean gradient measures 4.0 mmHg. Aortic valve peak gradient measures 9.2 mmHg. Aortic valve area, by VTI measures 2.56 cm. Pulmonic Valve: The pulmonic valve was normal in structure. Pulmonic valve regurgitation is not visualized. Pulmonic regurgitation is not visualized. Aorta: The aortic root, ascending aorta and aortic arch are all structurally normal, with no evidence of dilitation or obstruction. IAS/Shunts: No atrial level shunt detected by color flow Doppler.  LEFT VENTRICLE PLAX 2D LVIDd:         4.93 cm       Diastology LVIDs:         3.53 cm       LV e' lateral:   9.36 cm/s LV PW:         1.21 cm       LV E/e' lateral: 7.7 LV IVS:        1.02 cm       LV e' medial:    6.96 cm/s LVOT diam:     2.10 cm       LV E/e' medial:  10.3 LV SV:         63 ml LV SV Index:   28.30 LVOT Area:     3.46 cm  LV Volumes (MOD) LV area d, A4C:    36.70 cm LV area s, A4C:    22.00 cm LV major d, A4C:   8.31 cm LV major s,  A4C:   6.98 cm LV vol d, MOD A4C: 132.0 ml LV vol s, MOD A4C: 58.0 ml LV SV MOD A4C:     132.0 ml LEFT ATRIUM             Index LA diam:        3.90 cm 1.83 cm/m LA Vol (A2C):   71.0 ml 33.33 ml/m LA Vol (A4C):   56.0 ml 26.29 ml/m LA Biplane Vol: 63.8 ml 29.95 ml/m  AORTIC VALVE                   PULMONIC VALVE AV Area (Vmax):    2.62 cm    PV Vmax:       1.52 m/s AV Area (Vmean):   2.73 cm    PV Vmean:      89.500 cm/s AV Area (VTI):     2.56 cm    PV VTI:        0.330 m AV Vmax:           152.00 cm/s PV Peak grad:  9.2 mmHg AV Vmean:          95.300 cm/s PV Mean grad:  4.0 mmHg AV VTI:            0.317 m AV Peak Grad:      9.2 mmHg AV Mean Grad:      4.0 mmHg LVOT  Vmax:         115.00 cm/s LVOT Vmean:        75.000 cm/s LVOT VTI:          0.234 m LVOT/AV VTI ratio: 0.74  AORTA Ao Root diam: 3.40 cm MITRAL VALVE MV Area (PHT): 2.69 cm             SHUNTS MV Peak grad:  2.3 mmHg             Systemic VTI:  0.23 m MV Mean grad:  1.0 mmHg             Systemic Diam: 2.10 cm MV Vmax:       0.76 m/s MV Vmean:      45.4 cm/s MV VTI:        0.28 m MV PHT:        81.78 msec MV Decel Time: 282 msec MV E velocity: 71.80 cm/s 103 cm/s MV A velocity: 69.80 cm/s 70.3 cm/s MV E/A ratio:  1.03       1.5  Serafina Royals MD Electronically signed by Serafina Royals MD Signature Date/Time: 08/07/2019/2:23:51 PM    Final     Medications:  I have reviewed the patient's current medications. Scheduled: . amLODipine  5 mg Oral Daily  . aspirin  325 mg Oral Daily  . clopidogrel  75 mg Oral Daily  . enoxaparin (LOVENOX) injection  40 mg Subcutaneous Q24H  . primidone  50 mg Oral QHS  . sodium chloride flush  3 mL Intravenous Once    Assessment/Plan: 58 y.o. male presenting with mild right sided weakness and dizziness.  MRI of the brain reviewed and shows an acute left pontine infarct.  Etiology likely small vessel disease.  Carotid dopplers show no evidence of hemodynamically significant stenosis.  Echocardiogram shows  no cardiac source of emboli with an EF of 55-60%.  A1c 5.0, LDL 90.  Patient on no antiplatelet therapy prior to admission.     Recommendations: 1. Statin for lipid management with target LDL<70. 2. ASA 81mg  and Plavix 75mg  for three weeks with change to ASA 81mg  daily alone as monotherapy after that time. 3. BP management with target BP<140/80 4. Patient to follow up with Neurology at [facility] after discharge   LOS: 0 days   Alexis Goodell, MD Neurology [phone] 08/07/2019  2:46 PM

## 2019-08-07 NOTE — Evaluation (Signed)
Physical Therapy Evaluation Patient Details Name: Rodney Rojas MRN: [id number] DOB: [birth year] Today's Date: 08/07/2019   History of Present Illness  Pt is 58 y.o. male presenting with mild right sided weakness and dizziness.  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  PMH of benign essential tremor, HTN, L TKA, GERD,     Clinical Impression  Patient A&Ox4, reported near return to baseline. The patient stated he is independent at baseline, works, drives, lives with his wife.   Upon assessment the patient denied any lightheadedness/dizziness/double vision while transferring or with ambulation this session. Gross coordination/sensation/strength grossly WFLs, mild strength deficit in R hip flexion, RUE grossly 4+/5. Pronator drift still present. Pt performed DGI and gait velocity indicative of community ambulation and low risk of falls. The patient demonstrated and reported return to baseline level of functioning, no further acute PT needs indicated. PT to sign off. Please reconsult PT if pt status changes or acute needs are identified.  Of note pt educated to contact PCP for any concerns that may appear for outpatient PT referral.    Follow Up Recommendations No PT follow up    Equipment Recommendations  None recommended by PT    Recommendations for Other Services OT consult     Precautions / Restrictions Precautions Precautions: Fall Restrictions Weight Bearing Restrictions: No      Mobility  Bed Mobility Overal bed mobility: Independent                Transfers Overall transfer level: Independent                  Ambulation/Gait Ambulation/Gait assistance: Independent Gait Distance (Feet): 180 Feet Assistive device: None     Gait velocity interpretation: >2.62 ft/sec, indicative of community ambulatory    Stairs            Wheelchair Mobility    Modified Rankin (Stroke Patients Only) Modified Rankin (Stroke Patients  Only) Pre-Morbid Rankin Score: No symptoms Modified Rankin: No significant disability     Balance Overall balance assessment: Independent Sitting-balance support: Feet supported Sitting balance-Leahy Scale: Normal       Standing balance-Leahy Scale: Normal                   Standardized Balance Assessment Standardized Balance Assessment : Dynamic Gait Index   Dynamic Gait Index Level Surface: Normal Change in Gait Speed: Normal Gait with Horizontal Head Turns: Normal Gait with Vertical Head Turns: Normal Gait and Pivot Turn: Normal Step Over Obstacle: Normal Step Around Obstacles: Normal Steps: Mild Impairment(clinical judgement) Total Score: 23       Pertinent Vitals/Pain Pain Assessment: No/denies pain    Home Living Family/patient expects to be discharged to:: Private residence Living Arrangements: Spouse/significant other Available Help at Discharge: Family;Available 24 hours/day Type of Home: House Home Access: Stairs to enter Entrance Stairs-Rails: Right Entrance Stairs-Number of Steps: 2-3 Home Layout: One level Home Equipment: None      Prior Function Level of Independence: Independent         Comments: employed, driving, active individual     Hand Dominance   Dominant Hand: Right    Extremity/Trunk Assessment   Upper Extremity Assessment Upper Extremity Assessment: Defer to OT evaluation;RUE deficits/detail;LUE deficits/detail RUE Deficits / Details: RUE grossly 4+/5 RUE Sensation: WNL LUE Deficits / Details: 5/5 LUE Sensation: WNL LUE Coordination: WNL    Lower Extremity Assessment Lower Extremity Assessment: RLE deficits/detail;LLE deficits/detail RLE Deficits / Details: pt reported pain  with R hip flexion MMT, 4/5, grossly 5/5 for remaining MMT RLE Sensation: WNL RLE Coordination: WNL LLE Deficits / Details: 5/5 LLE Sensation: WNL LLE Coordination: WNL    Cervical / Trunk Assessment Cervical / Trunk Assessment: Normal   Communication   Communication: No difficulties  Cognition Arousal/Alertness: Awake/alert Behavior During Therapy: WFL for tasks assessed/performed Overall Cognitive Status: Within Functional Limits for tasks assessed                                        General Comments      Exercises     Assessment/Plan    PT Assessment Patent does not need any further PT services  PT Problem List         PT Treatment Interventions      PT Goals (Current goals can be found in the Care Plan section)       Frequency     Barriers to discharge        Co-evaluation               AM-PAC PT "6 Clicks" Mobility  Outcome Measure Help needed turning from your back to your side while in a flat bed without using bedrails?: None Help needed moving from lying on your back to sitting on the side of a flat bed without using bedrails?: None Help needed moving to and from a bed to a chair (including a wheelchair)?: None Help needed standing up from a chair using your arms (e.g., wheelchair or bedside chair)?: None Help needed to walk in hospital room?: None Help needed climbing 3-5 steps with a railing? : None 6 Click Score: 24    End of Session Equipment Utilized During Treatment: Gait belt Activity Tolerance: Patient tolerated treatment well Patient left: in chair;with call bell/phone within reach Nurse Communication: Mobility status PT Visit Diagnosis: Other abnormalities of gait and mobility (R26.89)    Time: [id number] PT Time Calculation (min) (ACUTE ONLY): 16 min   Charges:   PT Evaluation $PT Eval Low Complexity: 1 Low          Lieutenant Diego PT, DPT 10:36 AM,08/07/19

## 2019-08-08 ENCOUNTER — Telehealth: Payer: Self-pay

## 2019-08-08 NOTE — Telephone Encounter (Signed)
Copied from [place] [id number]. Topic: Appointment Scheduling - Scheduling Inquiry for Clinic >> Aug 08, 2019  8:45 AM Sheran Luz wrote: Patient's wife calling to schedule hospital follow up for patient within a week. Nothing available with PCP until 1/18 for type of visit. Please advise. >> Aug 08, 2019  1:42 PM Richardo Priest, [place] wrote: Pt's wife called in stating she is waiting on a call in regards to hospital follow up, due to pt having stroke. Please advise.

## 2019-08-08 NOTE — Telephone Encounter (Signed)
OK to put in one of the same day appointment slots.

## 2019-08-08 NOTE — Telephone Encounter (Signed)
Please advise if you are able to work patient in next week for hospital f/u.

## 2019-08-09 NOTE — Telephone Encounter (Signed)
Apt for 10am on 08/14/2019  Thanks,   -Mickel Baas

## 2019-08-14 ENCOUNTER — Ambulatory Visit (INDEPENDENT_AMBULATORY_CARE_PROVIDER_SITE_OTHER): Payer: BC Managed Care – PPO | Admitting: Family Medicine

## 2019-08-14 ENCOUNTER — Other Ambulatory Visit: Payer: Self-pay

## 2019-08-14 ENCOUNTER — Encounter: Payer: Self-pay | Admitting: Family Medicine

## 2019-08-14 VITALS — BP 147/84 | HR 67 | Temp 97.1°F | Wt 219.0 lb

## 2019-08-14 DIAGNOSIS — I10 Essential (primary) hypertension: Secondary | ICD-10-CM

## 2019-08-14 DIAGNOSIS — G250 Essential tremor: Secondary | ICD-10-CM | POA: Diagnosis not present

## 2019-08-14 DIAGNOSIS — E291 Testicular hypofunction: Secondary | ICD-10-CM | POA: Diagnosis not present

## 2019-08-14 DIAGNOSIS — Z8673 Personal history of transient ischemic attack (TIA), and cerebral infarction without residual deficits: Secondary | ICD-10-CM | POA: Diagnosis not present

## 2019-08-14 MED ORDER — ANDROGEL 20.25 MG/1.25GM (1.62%) TD GEL: 2.0000 | Freq: Every day | TRANSDERMAL | 2 refills | Status: DC

## 2019-08-14 MED ORDER — TESTOSTERONE 20.25 MG/1.25GM (1.62%) TD GEL: 2.0000 | Freq: Every day | TRANSDERMAL | 1 refills | Status: DC

## 2019-08-14 MED ORDER — HYDROCHLOROTHIAZIDE 12.5 MG PO TABS: 12.5000 mg | ORAL_TABLET | Freq: Every day | ORAL | 1 refills | Status: DC

## 2019-08-14 NOTE — Addendum Note (Signed)
Addended by: Birdie Sons on: 08/14/2019 05:57 PM   Modules accepted: Orders

## 2019-08-14 NOTE — Progress Notes (Signed)
Patient: Rodney Rojas Male    DOB: 17-Jul-1961   59 y.o.   MRN: [id number] Visit Date: 08/14/2019  Today's Provider: Lelon Huh, MD   Chief Complaint  Patient presents with  . Hospitalization Follow-up   Subjective:     HPI  Follow up Hospitalization  Patient was admitted to [facility] on 08/06/2019 and discharged on 08/07/2019. He was treated for CVA. Treatment for this included starting aspirin 81 mg daily plus Plavix for short course 3 to 4 weeks then aspirin monotherapy after that. Lipitor was prescribed. Bisoprolol was changed over to Norvasc secondary to bradycardia.He was not on ASA prior to recent CVA.  He reports good compliance with treatment. He reports this condition is Improved. Patient C/O balance issues and elevated BP. He was having trouble using right arm when he first presented to ER, but this has now resolved. Has not had any falls. Balance is improved since discharge,but not back to baseline. He doesn't think his tremor has worsened since stopping bisoprolol.  He also states he stopped taking testosterone patch due to it causing rash and itching.   Lab Results  Component Value Date   TESTOSTERONE 235 (L) 01/22/2019     ------------------------------------------------------------------------------------    Allergies  Allergen Reactions  . Bee Venom Anaphylaxis, Itching and Other (See Comments)    Bee stings     Current Outpatient Medications:  .  amLODipine (NORVASC) 5 MG tablet, Take 1 tablet (5 mg total) by mouth daily., Disp: 30 tablet, Rfl: 0 .  aspirin EC 81 MG tablet, Take 1 tablet (81 mg total) by mouth daily., Disp: 30 tablet, Rfl: 0 .  atorvastatin (LIPITOR) 40 MG tablet, Take 1 tablet (40 mg total) by mouth daily., Disp: 30 tablet, Rfl: 0 .  clopidogrel (PLAVIX) 75 MG tablet, Take 1 tablet (75 mg total) by mouth daily., Disp: 30 tablet, Rfl: 0 .  primidone (MYSOLINE) 50 MG tablet, TAKE 1 TO 2 TABLETS(50 TO 100 MG) BY MOUTH AT BEDTIME,  Disp: 180 tablet, Rfl: 3  Review of Systems  Constitutional: Negative for appetite change, chills and fever.  Respiratory: Negative for chest tightness, shortness of breath and wheezing.   Cardiovascular: Negative for chest pain and palpitations.  Gastrointestinal: Negative for abdominal pain, nausea and vomiting.    Social History   Tobacco Use  . Smoking status: Never Smoker  . Smokeless tobacco: Never Used  Substance Use Topics  . Alcohol use: Yes    Alcohol/week: 1.0 standard drinks    Types: 1 Cans of beer per week    Comment: occasional use      Objective:   BP (!) 147/84 (BP Location: Right Arm, Patient Position: Sitting, Cuff Size: Large)   Pulse 67   Temp (!) 97.1 F (36.2 C) (Temporal)   Wt 219 lb (99.3 kg)   BMI 32.34 kg/m  Vitals:   08/14/19 0959  BP: (!) 147/84  Pulse: 67  Temp: (!) 97.1 F (36.2 C)  TempSrc: Temporal  Weight: 219 lb (99.3 kg)  Body mass index is 32.34 kg/m.   Physical Exam   General Appearance:    Overweight male in no acute distress  Eyes:    PERRL, conjunctiva/corneas clear, EOM's intact       Lungs:     Clear to auscultation bilaterally, respirations unlabored  Heart:    Normal heart rate. Normal rhythm. No murmurs, rubs, or gallops.   MS:   All extremities are intact.   Neurologic:  Awake, alert, oriented x 3. No apparent focal neurological           defect.           Assessment & Plan    1. History of CVA in adulthood Balance not quite back to baseline, but otherwise greatly improved. He was not on ASA prior to CVA. Will finish 1 months of DAPT then continue 76m ECASA monotherapy indefinitely. Recently initiate high intensity statin therapy which he is tolerating well.   2. . Essential tremor Off beta-blocker since hospitalization due to bradycardia. Continues on primidone  4. Essential hypertension Goal is to get resting BP consistently <130/80. Tolerating initiation of amlodipine, off of bisoprolol. Will add-  hydrochlorothiazide (HYDRODIURIL) 12.5 MG tablet; Take 1 tablet (12.5 mg total) by mouth daily.  Dispense: 90 tablet; Refill: 1  5. Hypogonadism in male He stopped testosterone patch due to it irritating skin. Will try topical gel.  - Testosterone 20.25 MG/1.25GM (1.62%) GEL; Place 2 packets onto the skin daily.  Dispense: 60 packet; Refill: 1  Recheck testosterone, lipids, and met c at follow up in a month.    The entirety of the information documented in the History of Present Illness, Review of Systems and Physical Exam were personally obtained by me. Portions of this information were initially documented by LIdelle Jo CMA and reviewed by me for thoroughness and accuracy.     DLelon Huh MD  [facility]

## 2019-08-14 NOTE — Patient Instructions (Signed)
.   Please review the attached list of medications and notify my office if there are any errors.   . Take the atorvastatin at night with the primidone which will help it work better

## 2019-08-15 ENCOUNTER — Other Ambulatory Visit: Payer: Self-pay | Admitting: Family Medicine

## 2019-08-15 DIAGNOSIS — G250 Essential tremor: Secondary | ICD-10-CM

## 2019-08-15 NOTE — Telephone Encounter (Signed)
Requested medication (s) are due for refill today: no  Requested medication (s) are on the active medication list: no  Last refill:  07/16/2019  Future visit scheduled: yes  Notes to clinic:  medication was stop at discharge Review for refill   Requested Prescriptions  Pending Prescriptions Disp Refills   bisoprolol (ZEBETA) 10 MG tablet [Pharmacy Med Name: BISOPROLOL FUMARATE 10MG  TABLETS] 30 tablet 5    Sig: TAKE 1 TABLET(10 MG) BY MOUTH DAILY      Cardiovascular:  Beta Blockers Failed - 08/15/2019  6:34 AM      Failed - Last BP in normal range    BP Readings from Last 1 Encounters:  08/14/19 (!) 147/84          Passed - Last Heart Rate in normal range    Pulse Readings from Last 1 Encounters:  08/14/19 67          Passed - Valid encounter within last 6 months    Recent Outpatient Visits           Yesterday History of CVA in adulthood   [place], Kirstie Peri, MD   7 months ago Benign essential tremor   [facility] Birdie Sons, MD   2 years ago Benign essential tremor   [facility] Birdie Sons, MD   3 years ago Cellulitis of abdominal wall   [facility] Birdie Sons, MD   4 years ago Intermittent palpitations   [facility] Birdie Sons, MD       Future Appointments             In 1 month Fisher, Kirstie Peri, MD [facility], [place]

## 2019-08-24 DIAGNOSIS — Z8673 Personal history of transient ischemic attack (TIA), and cerebral infarction without residual deficits: Secondary | ICD-10-CM | POA: Diagnosis not present

## 2019-08-24 DIAGNOSIS — I639 Cerebral infarction, unspecified: Secondary | ICD-10-CM | POA: Insufficient documentation

## 2019-08-24 DIAGNOSIS — G250 Essential tremor: Secondary | ICD-10-CM | POA: Diagnosis not present

## 2019-08-28 ENCOUNTER — Other Ambulatory Visit: Payer: Self-pay | Admitting: Family Medicine

## 2019-08-28 ENCOUNTER — Telehealth: Payer: Self-pay | Admitting: Family Medicine

## 2019-08-28 DIAGNOSIS — I10 Essential (primary) hypertension: Secondary | ICD-10-CM

## 2019-08-28 MED ORDER — ATORVASTATIN CALCIUM 40 MG PO TABS: 40.0000 mg | ORAL_TABLET | Freq: Every day | ORAL | 1 refills | Status: DC

## 2019-08-28 MED ORDER — AMLODIPINE BESYLATE 5 MG PO TABS: 5.0000 mg | ORAL_TABLET | Freq: Every day | ORAL | 1 refills | Status: DC

## 2019-08-28 NOTE — Telephone Encounter (Signed)
Prescription for testerone gel [facility] to [facility] on 08/14/2019. Please see if patient was able to get this filled or if there was any trouble with insurance coverage.

## 2019-08-28 NOTE — Telephone Encounter (Signed)
Patient is going out of town for work and will run out of Amlodipine 5 mg. and Atorvastatin 40 mg. During that time.   He needs refills sent to [facility] at [place]

## 2019-08-28 NOTE — Telephone Encounter (Signed)
Called and spoke with patient and he will be able to pick up his prescription tomorrow.

## 2019-08-29 ENCOUNTER — Other Ambulatory Visit: Payer: Self-pay | Admitting: Family Medicine

## 2019-08-29 DIAGNOSIS — I10 Essential (primary) hypertension: Secondary | ICD-10-CM

## 2019-08-29 NOTE — Telephone Encounter (Signed)
Patient is requesting 90 day supply of refilled medication- appointment scheduled 09/21/19. Sent for review of request

## 2019-08-29 NOTE — Telephone Encounter (Signed)
Patient advised that a 30 day supply was sent in. Patient advised that if labs are stable after follow up appointment, we will send in 90 day supply. Patient verbalized understanding.

## 2019-09-11 DIAGNOSIS — I10 Essential (primary) hypertension: Secondary | ICD-10-CM | POA: Diagnosis not present

## 2019-09-11 DIAGNOSIS — Z8673 Personal history of transient ischemic attack (TIA), and cerebral infarction without residual deficits: Secondary | ICD-10-CM | POA: Diagnosis not present

## 2019-09-11 DIAGNOSIS — E782 Mixed hyperlipidemia: Secondary | ICD-10-CM | POA: Diagnosis not present

## 2019-09-11 DIAGNOSIS — I639 Cerebral infarction, unspecified: Secondary | ICD-10-CM | POA: Diagnosis not present

## 2019-09-11 DIAGNOSIS — G250 Essential tremor: Secondary | ICD-10-CM | POA: Diagnosis not present

## 2019-09-21 ENCOUNTER — Ambulatory Visit: Payer: BC Managed Care – PPO | Admitting: Family Medicine

## 2019-09-21 ENCOUNTER — Encounter: Payer: Self-pay | Admitting: Family Medicine

## 2019-09-21 ENCOUNTER — Other Ambulatory Visit: Payer: Self-pay

## 2019-09-21 VITALS — BP 132/70 | HR 68 | Temp 97.5°F | Resp 16 | Wt 212.0 lb

## 2019-09-21 DIAGNOSIS — E291 Testicular hypofunction: Secondary | ICD-10-CM

## 2019-09-21 DIAGNOSIS — M25511 Pain in right shoulder: Secondary | ICD-10-CM | POA: Diagnosis not present

## 2019-09-21 DIAGNOSIS — G8929 Other chronic pain: Secondary | ICD-10-CM | POA: Diagnosis not present

## 2019-09-21 DIAGNOSIS — I10 Essential (primary) hypertension: Secondary | ICD-10-CM | POA: Diagnosis not present

## 2019-09-21 MED ORDER — AMLODIPINE BESYLATE 5 MG PO TABS: 5.0000 mg | ORAL_TABLET | Freq: Every day | ORAL | 3 refills | Status: DC

## 2019-09-21 NOTE — Patient Instructions (Addendum)
.   Please review the attached list of medications and notify my office if there are any errors.   . Please bring all of your medications to every appointment so we can make sure that our medication list is the same as yours.    We need to check your testosterone levels a month after starting the testosterone gel. An order has been placed, so you can just stop by our lab in [address] some morning Monday through Friday in mid March

## 2019-09-21 NOTE — Progress Notes (Signed)
Patient: Rodney Rojas Male    DOB: Jan 11, 1961   59 y.o.   MRN: [id number] Visit Date: 09/21/2019  Today's Provider: Lelon Huh, MD   Chief Complaint  Patient presents with  . Hypertension   Subjective:     HPI  Hypertension, follow-up:  BP Readings from Last 3 Encounters:  09/21/19 132/70  08/14/19 (!) 147/84  08/07/19 137/77    He was last seen for hypertension 1 months ago.  BP at that visit was 147/84. Management since that visit includes added HCTZ 12.5mg  daily. He reports good compliance with treatment. He is not having side effects.  He is exercising. He is adherent to low salt diet.   Outside blood pressures are checked occasionally and average 135/85. He is experiencing none.  Patient denies chest pain, chest pressure/discomfort, claudication, dyspnea, exertional chest pressure/discomfort, fatigue, irregular heart beat, lower extremity edema, near-syncope, orthopnea, palpitations, paroxysmal nocturnal dyspnea, syncope and tachypnea.   Cardiovascular risk factors include dyslipidemia, hypertension and male gender.  Use of agents associated with hypertension: NSAIDS.     Weight trend: fluctuating a bit Wt Readings from Last 3 Encounters:  09/21/19 212 lb (96.2 kg)  08/14/19 219 lb (99.3 kg)  08/06/19 215 lb (97.5 kg)    Current diet: in general, a "healthy" diet    ------------------------------------------------------------------------  Follow up for Hypogonadism in male   The patient was last seen for this 1 months ago. Changes made at last visit include trying topical Testosterone gel.   Patient has not started on medication due to insurance requiring a Prior authorization. Patient states the medication was finally approved, but he has not started taking it.  He feels that condition is Unchanged. He is not having side effects.   ------------------------------------------------------------------------------------ He also reports he has  been having intermittent pain around right shoulder. It is actually more underside of shoulder and worse with physical activity. He state he has had steroid shots by orthopedist in the past which worked well, but he is not anxious for an injection.    Allergies  Allergen Reactions  . Bee Venom Anaphylaxis, Itching and Other (See Comments)    Bee stings     Current Outpatient Medications:  .  amLODipine (NORVASC) 5 MG tablet, Take 1 tablet (5 mg total) by mouth daily., Disp: 30 tablet, Rfl: 1 .  aspirin EC 81 MG tablet, Take 1 tablet (81 mg total) by mouth daily., Disp: 30 tablet, Rfl: 0 .  atorvastatin (LIPITOR) 40 MG tablet, Take 1 tablet (40 mg total) by mouth daily., Disp: 30 tablet, Rfl: 1 .  clopidogrel (PLAVIX) 75 MG tablet, Take 1 tablet (75 mg total) by mouth daily., Disp: 30 tablet, Rfl: 0 .  hydrochlorothiazide (HYDRODIURIL) 12.5 MG tablet, Take 1 tablet (12.5 mg total) by mouth daily., Disp: 90 tablet, Rfl: 1 .  primidone (MYSOLINE) 50 MG tablet, TAKE 1 TO 2 TABLETS(50 TO 100 MG) BY MOUTH AT BEDTIME, Disp: 180 tablet, Rfl: 3 .  ANDROGEL 20.25 MG/1.25GM (1.62%) GEL, Place 2 packets onto the skin daily. (Patient not taking: Reported on 09/21/2019), Disp: 75 g, Rfl: 2  Review of Systems  Constitutional: Negative for appetite change, chills and fever.  Respiratory: Negative for chest tightness, shortness of breath and wheezing.   Cardiovascular: Negative for chest pain and palpitations.  Gastrointestinal: Negative for abdominal pain, nausea and vomiting.  Musculoskeletal: Positive for arthralgias (shoulder pain).    Social History   Tobacco Use  . Smoking status: Never  Smoker  . Smokeless tobacco: Never Used  Substance Use Topics  . Alcohol use: Yes    Alcohol/week: 1.0 standard drinks    Types: 1 Cans of beer per week    Comment: occasional use      Objective:   BP 132/70 (BP Location: Left Arm, Patient Position: Sitting, Cuff Size: Large)   Pulse 68   Temp (!) 97.5  F (36.4 C) (Temporal)   Resp 16   Wt 212 lb (96.2 kg)   SpO2 99% Comment: room air  BMI 31.31 kg/m  Vitals:   09/21/19 0821  BP: 132/70  Pulse: 68  Resp: 16  Temp: (!) 97.5 F (36.4 C)  TempSrc: Temporal  SpO2: 99%  Weight: 212 lb (96.2 kg)  Body mass index is 31.31 kg/m.   Physical Exam  General appearance: Obese male, cooperative and in no acute distress Head: Normocephalic, without obvious abnormality, atraumatic Respiratory: Respirations even and unlabored, normal respiratory rate Extremities: Tender short head of biceps on right. Pain reproduced with shoulder rotation. Normal abduction and negative empty can test.       Assessment & Plan    1. Essential hypertension Much better with addition of low dose - amLODipine (NORVASC) 5 MG tablet; Take 1 tablet (5 mg total) by mouth daily.  Dispense: 90 tablet; Refill: 3  Check renal panel with next lab draw.  2. Hypogonadism in male Has finally gotten insurance approval for topical testosterone which he anticipates starting in the next few days. Is to check labs 3-4 weeks after starting testosterone replacement.  - Testosterone,Free and Total; Future - PSA; Future - CBC; Future  3. Chronic right shoulder pain Recommend heating pads, consider PT, consider orthopedic referral.   The entirety of the information documented in the History of Present Illness, Review of Systems and Physical Exam were personally obtained by me. Portions of this information were initially documented by Meyer Cory, CMA and reviewed by me for thoroughness and accuracy.      Lelon Huh, MD  [facility]

## 2019-10-10 DIAGNOSIS — R002 Palpitations: Secondary | ICD-10-CM | POA: Diagnosis not present

## 2019-10-11 DIAGNOSIS — I10 Essential (primary) hypertension: Secondary | ICD-10-CM | POA: Diagnosis not present

## 2019-10-11 DIAGNOSIS — I639 Cerebral infarction, unspecified: Secondary | ICD-10-CM | POA: Diagnosis not present

## 2019-10-11 DIAGNOSIS — E782 Mixed hyperlipidemia: Secondary | ICD-10-CM | POA: Diagnosis not present

## 2019-10-22 ENCOUNTER — Other Ambulatory Visit
Admission: RE | Admit: 2019-10-22 | Discharge: 2019-10-22 | Disposition: A | Discharge status: Home / Self Care | Payer: BC Managed Care – PPO | Source: Ambulatory Visit | Attending: Cardiology | Admitting: Cardiology

## 2019-10-22 ENCOUNTER — Other Ambulatory Visit: Payer: Self-pay

## 2019-10-22 DIAGNOSIS — Z20822 Contact with and (suspected) exposure to covid-19: Secondary | ICD-10-CM | POA: Diagnosis not present

## 2019-10-22 DIAGNOSIS — Z01812 Encounter for preprocedural laboratory examination: Secondary | ICD-10-CM | POA: Insufficient documentation

## 2019-10-22 LAB — SARS CORONAVIRUS 2 (TAT 6-24 HRS): SARS Coronavirus 2: NEGATIVE

## 2019-10-25 ENCOUNTER — Ambulatory Visit: Admission: RE | Admit: 2019-10-25 | Payer: BC Managed Care – PPO | Source: Home / Self Care | Admitting: Cardiology

## 2019-10-25 ENCOUNTER — Encounter: Admission: RE | Payer: Self-pay | Source: Home / Self Care

## 2019-10-25 SURGERY — LOOP RECORDER INSERTION
Anesthesia: LOCAL

## 2019-10-26 ENCOUNTER — Other Ambulatory Visit: Payer: Self-pay | Admitting: Family Medicine

## 2019-10-26 DIAGNOSIS — Z23 Encounter for immunization: Secondary | ICD-10-CM | POA: Diagnosis not present

## 2019-10-26 NOTE — Telephone Encounter (Signed)
Requested medication (s) are due for refill today: yes  Requested medication (s) are on the active medication list: yes  Last refill:  08/28/2019  Future visit scheduled: no  Notes to clinic:  cardiovascular: antilipid - statins failed  Requested Prescriptions  Pending Prescriptions Disp Refills   atorvastatin (LIPITOR) 40 MG tablet [Pharmacy Med Name: ATORVASTATIN 40MG  TABLETS] 30 tablet 1    Sig: TAKE 1 TABLET(40 MG) BY MOUTH DAILY      Cardiovascular:  Antilipid - Statins Failed - 10/26/2019 10:47 AM      Failed - HDL in normal range and within 360 days    HDL  Date Value Ref Range Status  08/07/2019 40 (L) >40 mg/dL Final  01/10/2019 50 >39 mg/dL Final          Failed - Triglycerides in normal range and within 360 days    Triglycerides  Date Value Ref Range Status  08/07/2019 200 (H) <150 mg/dL Final          Passed - Total Cholesterol in normal range and within 360 days    Cholesterol, Total  Date Value Ref Range Status  01/10/2019 165 100 - 199 mg/dL Final   Cholesterol  Date Value Ref Range Status  08/07/2019 170 0 - 200 mg/dL Final          Passed - LDL in normal range and within 360 days    LDL Calculated  Date Value Ref Range Status  01/10/2019 90 0 - 99 mg/dL Final   LDL Cholesterol  Date Value Ref Range Status  08/07/2019 90 0 - 99 mg/dL Final    Comment:           Total Cholesterol/HDL:CHD Risk Coronary Heart Disease Risk Table                     Men   Women  1/2 Average Risk   3.4   3.3  Average Risk       5.0   4.4  2 X Average Risk   9.6   7.1  3 X Average Risk  23.4   11.0        Use the calculated Patient Ratio above and the CHD Risk Table to determine the patient's CHD Risk.        ATP III CLASSIFICATION (LDL):  <100     mg/dL   Optimal  100-129  mg/dL   Near or Above                    Optimal  130-159  mg/dL   Borderline  160-189  mg/dL   High  >190     mg/dL   Very High Performed at [facility], [address], [place], [place] [address]           Passed - Patient is not pregnant      Passed - Valid encounter within last 12 months    Recent Outpatient Visits           1 month ago Essential hypertension   [facility] Birdie Sons, MD   2 months ago History of CVA in adulthood   [facility] Birdie Sons, MD   9 months ago Benign essential tremor   [facility] Birdie Sons, MD   2 years ago Benign essential tremor   [facility] Birdie Sons, MD   3 years ago Cellulitis of abdominal wall  [facility] Caryn Section, Kirstie Peri, MD

## 2019-11-12 DIAGNOSIS — I10 Essential (primary) hypertension: Secondary | ICD-10-CM | POA: Diagnosis not present

## 2019-11-12 DIAGNOSIS — I639 Cerebral infarction, unspecified: Secondary | ICD-10-CM | POA: Diagnosis not present

## 2019-11-26 DIAGNOSIS — Z8673 Personal history of transient ischemic attack (TIA), and cerebral infarction without residual deficits: Secondary | ICD-10-CM | POA: Diagnosis not present

## 2019-12-07 ENCOUNTER — Ambulatory Visit: Payer: BC Managed Care – PPO | Admitting: Family Medicine

## 2019-12-10 ENCOUNTER — Telehealth: Payer: Self-pay | Admitting: Family Medicine

## 2019-12-10 NOTE — Telephone Encounter (Signed)
Patient was prescribed testosterone gel in February and was supposed to have testosterone levels rechecked last months. Please check and see if he is still using the gel, and if so, he needs to stop by labs to get levels checked. May release future order from February if so.

## 2019-12-10 NOTE — Telephone Encounter (Signed)
I called and advised patient as below. Patient states that he never started the Testosterone gel because it wasn't approved by his insurance. I called pharmacy and spoke with pharmacist who advised me that the medication is not going through when she tries to run his insurance. Pharmacy is getting a message that states qty has exceeded maximum limit on plan. I called [employer] insurance and was advised that the medication was approved on 08/28/2019, for a qty of 30 packets per 30 days. Insurance representative then advised me that they denied coverage for additional packets that exceeded the plans limit of 30 packets per 30 days. Patients insurance will not cover the cost of 75grams per 30 days because it exceeds the plans limit.

## 2019-12-12 ENCOUNTER — Other Ambulatory Visit: Payer: Self-pay | Admitting: Family Medicine

## 2019-12-12 DIAGNOSIS — E291 Testicular hypofunction: Secondary | ICD-10-CM

## 2019-12-12 MED ORDER — ANDRODERM 4 MG/24HR TD PT24: 1.0000 | MEDICATED_PATCH | Freq: Every day | TRANSDERMAL | 2 refills | Status: DC

## 2019-12-12 MED ORDER — TESTOSTERONE 12.5 MG/ACT (1%) TD GEL: TRANSDERMAL | 3 refills | Status: DC

## 2019-12-12 NOTE — Telephone Encounter (Signed)
Contacted [facility] to cancel RX. Left patient a message advising that a different RX has been sent to pharmacy due to insurance denial.

## 2019-12-12 NOTE — Progress Notes (Signed)
Per fax from [facility], testosterone 1.62% packets not covered.  Angrogel, androderm, depot testosterone preferred.

## 2019-12-12 NOTE — Addendum Note (Signed)
Addended by: Birdie Sons on: 12/12/2019 01:24 PM   Modules accepted: Orders

## 2019-12-12 NOTE — Telephone Encounter (Signed)
Insurance doesn't cover 1.62% gel, so I sent prescription for patch. But review of chart indicates he didn't tolerate patch in the past. Can you please call [facility] [address] and cancel prescription for patch.   Please advise pt I sent prescription for different gel that will hopefully be covered.

## 2019-12-17 ENCOUNTER — Other Ambulatory Visit: Payer: Self-pay | Admitting: Family Medicine

## 2019-12-17 NOTE — Telephone Encounter (Signed)
Requested Prescriptions  Pending Prescriptions Disp Refills  . atorvastatin (LIPITOR) 40 MG tablet [Pharmacy Med Name: ATORVASTATIN 40MG  TABLETS] 90 tablet 2    Sig: TAKE 1 TABLET(40 MG) BY MOUTH DAILY     Cardiovascular:  Antilipid - Statins Failed - 12/17/2019  3:16 PM      Failed - HDL in normal range and within 360 days    HDL  Date Value Ref Range Status  08/07/2019 40 (L) >40 mg/dL Final  01/10/2019 50 >39 mg/dL Final         Failed - Triglycerides in normal range and within 360 days    Triglycerides  Date Value Ref Range Status  08/07/2019 200 (H) <150 mg/dL Final         Passed - Total Cholesterol in normal range and within 360 days    Cholesterol, Total  Date Value Ref Range Status  01/10/2019 165 100 - 199 mg/dL Final   Cholesterol  Date Value Ref Range Status  08/07/2019 170 0 - 200 mg/dL Final         Passed - LDL in normal range and within 360 days    LDL Calculated  Date Value Ref Range Status  01/10/2019 90 0 - 99 mg/dL Final   LDL Cholesterol  Date Value Ref Range Status  08/07/2019 90 0 - 99 mg/dL Final    Comment:           Total Cholesterol/HDL:CHD Risk Coronary Heart Disease Risk Table                     Men   Women  1/2 Average Risk   3.4   3.3  Average Risk       5.0   4.4  2 X Average Risk   9.6   7.1  3 X Average Risk  23.4   11.0        Use the calculated Patient Ratio above and the CHD Risk Table to determine the patient's CHD Risk.        ATP III CLASSIFICATION (LDL):  <100     mg/dL   Optimal  100-129  mg/dL   Near or Above                    Optimal  130-159  mg/dL   Borderline  160-189  mg/dL   High  >190     mg/dL   Very High Performed at [facility], [address] [place], [place] [address]          Passed - Patient is not pregnant      Passed - Valid encounter within last 12 months    Recent Outpatient Visits          2 months ago Essential hypertension   [facility] Birdie Sons, MD    4 months ago History of CVA in adulthood   [facility] Birdie Sons, MD   11 months ago Benign essential tremor   [facility] Birdie Sons, MD   2 years ago Benign essential tremor   [facility] Birdie Sons, MD   4 years ago Cellulitis of abdominal wall   [facility] Birdie Sons, MD

## 2019-12-31 ENCOUNTER — Other Ambulatory Visit: Payer: Self-pay | Admitting: Family Medicine

## 2019-12-31 DIAGNOSIS — G250 Essential tremor: Secondary | ICD-10-CM

## 2020-01-09 DIAGNOSIS — L570 Actinic keratosis: Secondary | ICD-10-CM | POA: Diagnosis not present

## 2020-01-09 DIAGNOSIS — D2272 Melanocytic nevi of left lower limb, including hip: Secondary | ICD-10-CM | POA: Diagnosis not present

## 2020-01-09 DIAGNOSIS — D2261 Melanocytic nevi of right upper limb, including shoulder: Secondary | ICD-10-CM | POA: Diagnosis not present

## 2020-01-09 DIAGNOSIS — D485 Neoplasm of uncertain behavior of skin: Secondary | ICD-10-CM | POA: Diagnosis not present

## 2020-01-09 DIAGNOSIS — D2262 Melanocytic nevi of left upper limb, including shoulder: Secondary | ICD-10-CM | POA: Diagnosis not present

## 2020-01-09 DIAGNOSIS — D225 Melanocytic nevi of trunk: Secondary | ICD-10-CM | POA: Diagnosis not present

## 2020-01-16 ENCOUNTER — Other Ambulatory Visit: Payer: Self-pay | Admitting: Family Medicine

## 2020-01-16 DIAGNOSIS — I10 Essential (primary) hypertension: Secondary | ICD-10-CM

## 2020-01-16 MED ORDER — HYDROCHLOROTHIAZIDE 12.5 MG PO TABS: 12.5000 mg | ORAL_TABLET | Freq: Every day | ORAL | 0 refills | Status: DC

## 2020-01-16 NOTE — Telephone Encounter (Signed)
Medication Refill - Medication: hydrochlorothiazide   Has the patient contacted their pharmacy? Yes.   (Agent: If no, request that the patient contact the pharmacy for the refill.) (Agent: If yes, when and what did the pharmacy advise?)  Preferred Pharmacy (with phone number or street name):  [id number] - [place], [place] - [place] [facility] [address]  [address] [place] [address]  Phone: [phone] Fax: [phone]  Not a 24 hour pharmacy; exact hours not known.     Agent: Please be advised that RX refills may take up to 3 business days. We ask that you follow-up with your pharmacy.

## 2020-01-28 DIAGNOSIS — X32XXXA Exposure to sunlight, initial encounter: Secondary | ICD-10-CM | POA: Diagnosis not present

## 2020-01-28 DIAGNOSIS — L570 Actinic keratosis: Secondary | ICD-10-CM | POA: Diagnosis not present

## 2020-03-03 DIAGNOSIS — I639 Cerebral infarction, unspecified: Secondary | ICD-10-CM | POA: Diagnosis not present

## 2020-03-03 DIAGNOSIS — I10 Essential (primary) hypertension: Secondary | ICD-10-CM | POA: Diagnosis not present

## 2020-03-04 ENCOUNTER — Ambulatory Visit: Payer: BC Managed Care – PPO | Admitting: Family Medicine

## 2020-03-10 NOTE — Progress Notes (Signed)
Established patient visit   Patient: Rodney Rojas   DOB: [birth year]   59 y.o. Male  MRN: [id number] Visit Date: 03/11/2020  Today's healthcare provider: Lelon Huh, MD   Chief Complaint  Patient presents with  . Hyperlipidemia  . Hypertension   I,Latasha Walston,acting as a scribe for Lelon Huh, MD.,have documented all relevant documentation on the behalf of Lelon Huh, MD,as directed by  Lelon Huh, MD while in the presence of Lelon Huh, MD.  Subjective    HPI  Hypertension, follow-up  BP Readings from Last 3 Encounters:  03/11/20 (!) 143/86  09/21/19 132/70  08/14/19 (!) 147/84   Wt Readings from Last 3 Encounters:  03/11/20 213 lb 6.4 oz (96.8 kg)  09/21/19 212 lb (96.2 kg)  08/14/19 219 lb (99.3 kg)     He was last seen for hypertension 6 months ago.  BP at that visit was 132/70. Management since that visit includes no change. He reports good compliance with treatment. He is not having side effects.  He is not exercising. He is not adherent to low salt diet.   Outside blood pressures are being checked occassionally.  He does not smoke.  Use of agents associated with hypertension: none.   --------------------------------------------------------------------------------------------------- Lipid/Cholesterol, follow-up  Last Lipid Panel: Lab Results  Component Value Date   CHOL 170 08/07/2019   LDLCALC 90 08/07/2019   HDL 40 (L) 08/07/2019   TRIG 200 (H) 08/07/2019    He was last seen for this 6 months ago.  Management since that visit includes no change.  He reports good compliance with treatment. He is not having side effects.   Symptoms: No appetite changes No foot ulcerations  No chest pain No chest pressure/discomfort  No dyspnea No orthopnea  No fatigue No lower extremity edema  No palpitations No paroxysmal nocturnal dyspnea  No nausea No numbness or tingling of extremity  No polydipsia No polyuria  No speech  difficulty No syncope   He is following a unhealthy diet. Current exercise: none  Last metabolic panel Lab Results  Component Value Date   GLUCOSE 93 08/06/2019   NA 138 08/06/2019   K 4.3 08/06/2019   BUN 13 08/06/2019   CREATININE 0.69 08/06/2019   GFRNONAA >60 08/06/2019   GFRAA >60 08/06/2019   CALCIUM 9.0 08/06/2019   AST 23 08/06/2019   ALT 24 08/06/2019   The ASCVD Risk score [name], et al., 2013) failed to calculate for the following reasons:   The patient has a prior MI or stroke diagnosis  --------------------------------------------------------------------------------------------------- Follow up for Hypogonadism in male:  The patient was last seen for this 6 months ago. Changes made at last visit include no change, continue topical testosterone and recheck labs in 3-4 weeks.  He reports poor compliance with treatment. Patient states insurance denied coverage for testosterone gel so he never started it He feels that condition is Unchanged. He is not having side effects.   -----------------------------------------------------------------------------------------  He also states he has a lot of trouble feeling sore and stiff in the morning and is concerned it may be a side effect of statin medication. He states it usually improves after a few hours and he feels well by mid day.      Medications: Outpatient Medications Prior to Visit  Medication Sig  . amLODipine (NORVASC) 5 MG tablet Take 1 tablet (5 mg total) by mouth daily.  Marland Kitchen aspirin EC 81 MG tablet Take 1 tablet (81 mg total) by  mouth daily.  Marland Kitchen atorvastatin (LIPITOR) 40 MG tablet TAKE 1 TABLET(40 MG) BY MOUTH DAILY (Patient taking differently: Take 40 mg by mouth daily. )  . hydrochlorothiazide (HYDRODIURIL) 12.5 MG tablet Take 1 tablet (12.5 mg total) by mouth daily.  . primidone (MYSOLINE) 50 MG tablet TAKE 1 TO 2 TABLETS(50 TO 100 MG) BY MOUTH AT BEDTIME (Patient taking differently: Take 50 mg by  mouth in the morning and at bedtime. )  . [DISCONTINUED] clopidogrel (PLAVIX) 75 MG tablet Take 1 tablet (75 mg total) by mouth daily. (Patient not taking: Reported on 10/19/2019)  . [DISCONTINUED] Testosterone 12.5 MG/ACT (1%) GEL 2 gel pumps applied to skin once a day (Patient not taking: Reported on 03/05/2020)   No facility-administered medications prior to visit.    Review of Systems  Constitutional: Negative.   Respiratory: Negative.   Cardiovascular: Negative.   Endocrine: Negative.   Musculoskeletal: Negative.       Objective    BP (!) 143/86 (BP Location: Right Arm, Patient Position: Sitting, Cuff Size: Large)   Pulse 65   Temp (!) 97.3 F (36.3 C) (Oral)   Ht 5\' 9"  (1.753 m)   Wt 213 lb 6.4 oz (96.8 kg)   BMI 31.51 kg/m    Physical Exam   General: Appearance:    Obese male in no acute distress  Eyes:    PERRL, conjunctiva/corneas clear, EOM's intact       Lungs:     Clear to auscultation bilaterally, respirations unlabored  Heart:    Normal heart rate. Normal rhythm. No murmurs, rubs, or gallops.   MS:   All extremities are intact.   Neurologic:   Awake, alert, oriented x 3. No apparent focal neurological           defect.        Assessment & Plan     1. Essential hypertension Well controlled.  Continue current medications.    2. Hypogonadism in male Not on testosterone replacement due to refusal of insurance to cover medication.   3. Mixed hyperlipidemia He is tolerating atorvastatin well with no adverse effects.   - Lipid panel  4. Myalgia  - CBC - C-reactive protein - CK (Creatine Kinase)   No follow-ups on file.      The entirety of the information documented in the History of Present Illness, Review of Systems and Physical Exam were personally obtained by me. Portions of this information were initially documented by the CMA and reviewed by me for thoroughness and accuracy.      Lelon Huh, MD  [facility] [phone]  (phone) [phone] (fax)  [place]

## 2020-03-11 ENCOUNTER — Other Ambulatory Visit: Payer: Self-pay

## 2020-03-11 ENCOUNTER — Encounter: Payer: Self-pay | Admitting: Family Medicine

## 2020-03-11 ENCOUNTER — Ambulatory Visit: Payer: BC Managed Care – PPO | Admitting: Family Medicine

## 2020-03-11 VITALS — BP 126/64 | HR 65 | Temp 97.3°F | Ht 69.0 in | Wt 213.4 lb

## 2020-03-11 DIAGNOSIS — M791 Myalgia, unspecified site: Secondary | ICD-10-CM

## 2020-03-11 DIAGNOSIS — E291 Testicular hypofunction: Secondary | ICD-10-CM | POA: Diagnosis not present

## 2020-03-11 DIAGNOSIS — E782 Mixed hyperlipidemia: Secondary | ICD-10-CM | POA: Diagnosis not present

## 2020-03-11 DIAGNOSIS — I10 Essential (primary) hypertension: Secondary | ICD-10-CM

## 2020-03-11 NOTE — Patient Instructions (Addendum)
   Please bring your Covid vaccine card to your next appointment so we can update your medical record   . Please go to the lab draw station in [address] on the second floor of [facility]  when you are fasting for 8 hours. Normal hours are 8:00am to 12:30pm and 1:30pm to 4:00pm Monday through Friday

## 2020-03-12 DIAGNOSIS — E782 Mixed hyperlipidemia: Secondary | ICD-10-CM | POA: Diagnosis not present

## 2020-03-12 DIAGNOSIS — M791 Myalgia, unspecified site: Secondary | ICD-10-CM | POA: Diagnosis not present

## 2020-03-13 LAB — CBC
Hematocrit: 39.4 % (ref 37.5–51.0)
Hemoglobin: 13.5 g/dL (ref 13.0–17.7)
MCH: 31 pg (ref 26.6–33.0)
MCHC: 34.3 g/dL (ref 31.5–35.7)
MCV: 90 fL (ref 79–97)
Platelets: 212 10*3/uL (ref 150–450)
RBC: 4.36 x10E6/uL (ref 4.14–5.80)
RDW: 13.1 % (ref 11.6–15.4)
WBC: 6.6 10*3/uL (ref 3.4–10.8)

## 2020-03-13 LAB — LIPID PANEL
Chol/HDL Ratio: 2.3 ratio (ref 0.0–5.0)
Cholesterol, Total: 113 mg/dL (ref 100–199)
HDL: 49 mg/dL (ref >39)
LDL Chol Calc (NIH): 46 mg/dL (ref 0–99)
Triglycerides: 94 mg/dL (ref 0–149)
VLDL Cholesterol Cal: 18 mg/dL (ref 5–40)

## 2020-03-13 LAB — CK: Total CK: 126 U/L (ref 41–331)

## 2020-03-13 LAB — C-REACTIVE PROTEIN: CRP: 2 mg/L (ref 0–10)

## 2020-03-17 ENCOUNTER — Other Ambulatory Visit
Admission: RE | Admit: 2020-03-17 | Discharge: 2020-03-17 | Disposition: A | Discharge status: Home / Self Care | Payer: BC Managed Care – PPO | Source: Ambulatory Visit | Attending: Cardiology | Admitting: Cardiology

## 2020-03-17 ENCOUNTER — Other Ambulatory Visit: Payer: Self-pay

## 2020-03-17 DIAGNOSIS — Z20822 Contact with and (suspected) exposure to covid-19: Secondary | ICD-10-CM | POA: Diagnosis not present

## 2020-03-17 DIAGNOSIS — Z01812 Encounter for preprocedural laboratory examination: Secondary | ICD-10-CM | POA: Insufficient documentation

## 2020-03-17 LAB — SARS CORONAVIRUS 2 (TAT 6-24 HRS): SARS Coronavirus 2: NEGATIVE

## 2020-03-18 ENCOUNTER — Encounter: Payer: Self-pay | Admitting: Physician Assistant

## 2020-03-19 ENCOUNTER — Other Ambulatory Visit: Payer: Self-pay

## 2020-03-19 ENCOUNTER — Encounter
Admission: RE | Disposition: A | Discharge status: Home / Self Care | Payer: Self-pay | Source: Home / Self Care | Attending: Cardiology

## 2020-03-19 ENCOUNTER — Encounter: Payer: Self-pay | Admitting: Cardiology

## 2020-03-19 ENCOUNTER — Ambulatory Visit
Admission: RE | Admit: 2020-03-19 | Discharge: 2020-03-19 | Disposition: A | Discharge status: Home / Self Care | Payer: BC Managed Care – PPO | Attending: Cardiology | Admitting: Cardiology

## 2020-03-19 DIAGNOSIS — E785 Hyperlipidemia, unspecified: Secondary | ICD-10-CM | POA: Diagnosis not present

## 2020-03-19 DIAGNOSIS — I639 Cerebral infarction, unspecified: Secondary | ICD-10-CM | POA: Insufficient documentation

## 2020-03-19 DIAGNOSIS — Z79899 Other long term (current) drug therapy: Secondary | ICD-10-CM | POA: Diagnosis not present

## 2020-03-19 DIAGNOSIS — I10 Essential (primary) hypertension: Secondary | ICD-10-CM | POA: Insufficient documentation

## 2020-03-19 DIAGNOSIS — Z7982 Long term (current) use of aspirin: Secondary | ICD-10-CM | POA: Insufficient documentation

## 2020-03-19 HISTORY — PX: LOOP RECORDER INSERTION: EP1214

## 2020-03-19 SURGERY — LOOP RECORDER INSERTION
Anesthesia: Moderate Sedation

## 2020-03-19 MED ORDER — LIDOCAINE-EPINEPHRINE (PF) 1 %-1:200000 IJ SOLN
INTRAMUSCULAR | Status: AC
  Filled 2020-03-19: qty 20

## 2020-03-19 MED ORDER — LIDOCAINE-EPINEPHRINE (PF) 1 %-1:200000 IJ SOLN
INTRAMUSCULAR | Status: AC
  Filled 2020-03-19: qty 10

## 2020-03-19 MED ORDER — LIDOCAINE-EPINEPHRINE (PF) 1 %-1:200000 IJ SOLN
INTRAMUSCULAR | Status: DC | PRN
  Administered 2020-03-19: 30 mL

## 2020-03-19 SURGICAL SUPPLY — 2 items
LOOP REVEAL LINQSYS (Prosthesis & Implant Heart) ×3 IMPLANT
PACK LOOP INSERTION (CUSTOM PROCEDURE TRAY) ×3 IMPLANT

## 2020-03-19 NOTE — Discharge Instructions (Signed)
Implantable Loop Recorder Placement, Care After This sheet gives you information about how to care for yourself after your procedure. Your health care provider may also give you more specific instructions. If you have problems or questions, contact your health care provider. What can I expect after the procedure? After the procedure, it is common to have:  Soreness or discomfort near the incision.  Some swelling or bruising near the incision. Follow these instructions at home: Incision care   Follow instructions from your health care provider about how to take care of your incision. Make sure you: ? Wash your hands with soap and water before you change your bandage (dressing). If soap and water are not available, use hand sanitizer. ? Change your dressing as told by your health care provider. ? Keep your dressing dry. ? Leave stitches (sutures), skin glue, or adhesive strips in place. These skin closures may need to stay in place for 2 weeks or longer. If adhesive strip edges start to loosen and curl up, you may trim the loose edges. Do not remove adhesive strips completely unless your health care provider tells you to do that.  Check your incision area every day for signs of infection. Check for: ? Redness, swelling, or pain. ? Fluid or blood. ? Warmth. ? Pus or a bad smell.  Do not take baths, swim, or use a hot tub until your health care provider approves. Ask your health care provider if you can take showers. Activity   Return to your normal activities as told by your health care provider. Ask your health care provider what activities are safe for you.  Do not drive for 24 hours if you were given a sedative during your procedure. General instructions  Follow instructions from your health care provider about how to manage your implantable loop recorder and transmit the information. Learn how to activate a recording if this is necessary for your type of device.  Do not go through  a metal detection gate, and do not let someone hold a metal detector over your chest. Show your ID card.  Do not have an MRI unless you check with your health care provider first.  Take over-the-counter and prescription medicines only as told by your health care provider.  Keep all follow-up visits as told by your health care provider. This is important. Contact a health care provider if:  You have redness, swelling, or pain around your incision.  You have a fever.  You have pain that is not relieved by your pain medicine.  You have triggered your device because of fainting (syncope) or because of a heartbeat that feels like it is racing, slow, fluttering, or skipping (palpitations). Get help right away if you have:  Chest pain.  Difficulty breathing. Summary  After the procedure, it is common to have soreness or discomfort near the incision.  Change your dressing as told by your health care provider.  Follow instructions from your health care provider about how to manage your implantable loop recorder and transmit the information.  Keep all follow-up visits as told by your health care provider. This is important. This information is not intended to replace advice given to you by your health care provider. Make sure you discuss any questions you have with your health care provider. Document Revised: 09/10/2017 Document Reviewed: 09/10/2017 Elsevier Patient Education  2020 [employer].

## 2020-03-31 DIAGNOSIS — I10 Essential (primary) hypertension: Secondary | ICD-10-CM | POA: Diagnosis not present

## 2020-03-31 DIAGNOSIS — Z8673 Personal history of transient ischemic attack (TIA), and cerebral infarction without residual deficits: Secondary | ICD-10-CM | POA: Diagnosis not present

## 2020-03-31 DIAGNOSIS — I639 Cerebral infarction, unspecified: Secondary | ICD-10-CM | POA: Diagnosis not present

## 2020-03-31 DIAGNOSIS — E782 Mixed hyperlipidemia: Secondary | ICD-10-CM | POA: Diagnosis not present

## 2020-04-10 ENCOUNTER — Other Ambulatory Visit: Payer: Self-pay | Admitting: Family Medicine

## 2020-04-10 DIAGNOSIS — I10 Essential (primary) hypertension: Secondary | ICD-10-CM

## 2020-04-23 DIAGNOSIS — M542 Cervicalgia: Secondary | ICD-10-CM | POA: Diagnosis not present

## 2020-04-23 DIAGNOSIS — M5414 Radiculopathy, thoracic region: Secondary | ICD-10-CM | POA: Diagnosis not present

## 2020-04-23 DIAGNOSIS — M5413 Radiculopathy, cervicothoracic region: Secondary | ICD-10-CM | POA: Diagnosis not present

## 2020-04-23 DIAGNOSIS — M5412 Radiculopathy, cervical region: Secondary | ICD-10-CM | POA: Diagnosis not present

## 2020-04-24 DIAGNOSIS — M542 Cervicalgia: Secondary | ICD-10-CM | POA: Diagnosis not present

## 2020-04-24 DIAGNOSIS — M5414 Radiculopathy, thoracic region: Secondary | ICD-10-CM | POA: Diagnosis not present

## 2020-04-24 DIAGNOSIS — M5412 Radiculopathy, cervical region: Secondary | ICD-10-CM | POA: Diagnosis not present

## 2020-04-24 DIAGNOSIS — M5413 Radiculopathy, cervicothoracic region: Secondary | ICD-10-CM | POA: Diagnosis not present

## 2020-04-28 DIAGNOSIS — I639 Cerebral infarction, unspecified: Secondary | ICD-10-CM | POA: Diagnosis not present

## 2020-05-22 ENCOUNTER — Telehealth: Payer: Self-pay

## 2020-05-22 NOTE — Telephone Encounter (Signed)
Copied from [place] [id number]. Topic: General - Other >> May 22, 2020  2:40 PM Hinda Lenis D wrote: PT ned a call back from a nurse team to go over his medications post stroke, hes in constat pain / please advise

## 2020-05-23 NOTE — Telephone Encounter (Signed)
Patient scheduled a appointment for 10/22 to discuss post stroke symptoms including muscle weakness and to discuss medications.

## 2020-05-29 DIAGNOSIS — Z96653 Presence of artificial knee joint, bilateral: Secondary | ICD-10-CM | POA: Diagnosis not present

## 2020-05-29 NOTE — Progress Notes (Signed)
     Established patient visit   Patient: Rodney Rojas   DOB: [birth year]   59 y.o. Male  MRN: [id number] Visit Date: 05/30/2020  Today's healthcare provider: Lelon Huh, MD   Chief Complaint  Patient presents with  . Muscle Pain   Subjective    Muscle Pain This is a new problem. Episode onset: several months ago after starting statin  The problem occurs intermittently. The problem is unchanged. Associated symptoms include fatigue and weakness. Pertinent negatives include no abdominal pain, chest pain, joint swelling, nausea or wheezing. (Muscle soreness and achiness)   Muscle pain is worse an night time and early mornings and improves during the day, has also had a lot of joint aches.        Medications: Outpatient Medications Prior to Visit  Medication Sig  . amLODipine (NORVASC) 5 MG tablet Take 1 tablet (5 mg total) by mouth daily.  Marland Kitchen aspirin EC 81 MG tablet Take 1 tablet (81 mg total) by mouth daily.  Marland Kitchen atorvastatin (LIPITOR) 40 MG tablet TAKE 1 TABLET(40 MG) BY MOUTH DAILY (Patient taking differently: Take 40 mg by mouth daily. )  . hydrochlorothiazide (HYDRODIURIL) 12.5 MG tablet TAKE 1 TABLET(12.5 MG) BY MOUTH DAILY  . primidone (MYSOLINE) 50 MG tablet TAKE 1 TO 2 TABLETS(50 TO 100 MG) BY MOUTH AT BEDTIME (Patient taking differently: Take 50 mg by mouth in the morning and at bedtime. )   No facility-administered medications prior to visit.    Review of Systems  Constitutional: Positive for fatigue. Negative for appetite change and chills.  Respiratory: Negative for chest tightness and wheezing.   Cardiovascular: Negative for chest pain and palpitations.  Gastrointestinal: Negative for abdominal pain and nausea.  Musculoskeletal: Negative for joint swelling.       Muscle weakness  Neurological: Positive for weakness.       Objective    BP 132/80 (BP Location: Left Arm, Patient Position: Sitting, Cuff Size: Large)   Pulse 62   Temp 98.2 F (36.8 C) (Oral)    Resp 16   Wt 215 lb (97.5 kg)   BMI 31.75 kg/m     Physical Exam  General appearance: Obese male, cooperative and in no acute distress Head: Normocephalic, without obvious abnormality, atraumatic Respiratory: Respirations even and unlabored, normal respiratory rate Extremities: All extremities are intact.  Skin: Skin color, texture, turgor normal. No rashes seen  Psych: Appropriate mood and affect. Neurologic: Mental status: Alert, oriented to person, place, and time, thought content appropriate.     Assessment & Plan     1. Myalgia Likely statin induced. Recommend put atorvastatin on hold for about 3 weeks. Try Vitamin D and CoQ10 supplements. Consider changing statin if improves off of atorvastatin.   2. Mixed hyperlipidemia Much better on atorvastatin, but likely having statin induced myalgias.   3. History of CVA in adulthood Counseled on benefits of statins in reducing second cerebrovascular events.   4. Need for influenza vaccination  - Flu Vaccine QUAD 36+ mos IM (Fluarix/Fluzone)        The entirety of the information documented in the History of Present Illness, Review of Systems and Physical Exam were personally obtained by me. Portions of this information were initially documented by the CMA and reviewed by me for thoroughness and accuracy.      Lelon Huh, MD  [facility] [phone] (phone) [phone] (fax)  [place]

## 2020-05-30 ENCOUNTER — Encounter: Payer: Self-pay | Admitting: Family Medicine

## 2020-05-30 ENCOUNTER — Other Ambulatory Visit: Payer: Self-pay

## 2020-05-30 ENCOUNTER — Ambulatory Visit (INDEPENDENT_AMBULATORY_CARE_PROVIDER_SITE_OTHER): Payer: BC Managed Care – PPO | Admitting: Family Medicine

## 2020-05-30 VITALS — BP 132/80 | HR 62 | Temp 98.2°F | Resp 16 | Wt 215.0 lb

## 2020-05-30 DIAGNOSIS — Z8673 Personal history of transient ischemic attack (TIA), and cerebral infarction without residual deficits: Secondary | ICD-10-CM | POA: Diagnosis not present

## 2020-05-30 DIAGNOSIS — Z23 Encounter for immunization: Secondary | ICD-10-CM

## 2020-05-30 DIAGNOSIS — E782 Mixed hyperlipidemia: Secondary | ICD-10-CM

## 2020-05-30 DIAGNOSIS — M791 Myalgia, unspecified site: Secondary | ICD-10-CM

## 2020-05-30 NOTE — Patient Instructions (Addendum)
•   Please review the attached list of medications and notify my office if there are any errors.    You can stop taking the atorvastatin for 2-3 weeks. If your muscle aches get better, we can change to a different class of statins and try some vitamins to reduce side effects.  We'll call you in about 3 weeks to see if are getting better   Try OTC Vitamin D3 1000 units once a day and Coenzyme Q10 100 mg every day to help with muscle aches and pains.

## 2020-06-23 ENCOUNTER — Telehealth: Payer: Self-pay | Admitting: Family Medicine

## 2020-06-23 DIAGNOSIS — E782 Mixed hyperlipidemia: Secondary | ICD-10-CM

## 2020-06-23 NOTE — Telephone Encounter (Signed)
Patient was seen last month and atorvastatin was put on hold due to muscle pains. Please check with patient to see if doing any better since then. If so will changed to different cholesterol medication

## 2020-06-23 NOTE — Telephone Encounter (Signed)
I called and spoke with patient. He states he is doing much better since stopping the Atorvastatin. His muscle aches have resolved.

## 2020-06-24 DIAGNOSIS — G250 Essential tremor: Secondary | ICD-10-CM | POA: Diagnosis not present

## 2020-06-24 DIAGNOSIS — Z8673 Personal history of transient ischemic attack (TIA), and cerebral infarction without residual deficits: Secondary | ICD-10-CM | POA: Diagnosis not present

## 2020-06-24 MED ORDER — ROSUVASTATIN CALCIUM 5 MG PO TABS: 5.0000 mg | ORAL_TABLET | Freq: Every day | ORAL | 2 refills | Status: DC

## 2020-06-24 NOTE — Telephone Encounter (Signed)
Please advise, have sent prescription for very low dose of rosuvastatin to take in its place. Much less likely to have side effects than atorvastatin. Need to follow up in 2 months regarding medication and to check lipids.

## 2020-06-24 NOTE — Telephone Encounter (Signed)
Pt advised.  Apt made for 08/27/2020  Thanks,   -Mickel Baas

## 2020-07-04 ENCOUNTER — Other Ambulatory Visit: Payer: Self-pay | Admitting: Family Medicine

## 2020-07-04 DIAGNOSIS — I10 Essential (primary) hypertension: Secondary | ICD-10-CM

## 2020-07-09 ENCOUNTER — Encounter: Payer: Self-pay | Admitting: Family Medicine

## 2020-07-30 ENCOUNTER — Telehealth (INDEPENDENT_AMBULATORY_CARE_PROVIDER_SITE_OTHER): Payer: BC Managed Care – PPO | Admitting: Adult Health

## 2020-07-30 ENCOUNTER — Encounter: Payer: Self-pay | Admitting: Adult Health

## 2020-07-30 DIAGNOSIS — H66002 Acute suppurative otitis media without spontaneous rupture of ear drum, left ear: Secondary | ICD-10-CM

## 2020-07-30 DIAGNOSIS — J0140 Acute pansinusitis, unspecified: Secondary | ICD-10-CM | POA: Diagnosis not present

## 2020-07-30 MED ORDER — AZITHROMYCIN 250 MG PO TABS: ORAL_TABLET | ORAL | 0 refills | Status: DC

## 2020-07-30 MED ORDER — PREDNISONE 10 MG (21) PO TBPK: ORAL_TABLET | ORAL | 0 refills | Status: DC

## 2020-07-30 NOTE — Progress Notes (Signed)
MyChart Video Visit    Virtual Visit via Video Note   This visit type was conducted due to national recommendations for restrictions regarding the COVID-19 Pandemic (e.g. social distancing) in an effort to limit this patient's exposure and mitigate transmission in our community. This patient is at least at moderate risk for complications without adequate follow up. This format is felt to be most appropriate for this patient at this time. Physical exam was limited by quality of the video and audio technology used for the visit.  Parties involved in visit as below:    Patient location: at home  Provider location: Provider: Provider's office at  [facility], [place].     I discussed the limitations of evaluation and management by telemedicine and the availability of in person appointments. The patient expressed understanding and agreed to proceed.  Patient: Rodney Rojas   DOB: Jul 15, 1961   59 y.o. Male  MRN: [id number] Visit Date: 07/30/2020  Today's healthcare provider: Marcille Buffy, FNP   Chief Complaint  Patient presents with  . URI   Subjective    HPI HPI    URI    Associated symptoms inlclude achiness, congestion, chills, cough, headache, rhinorrhea, sinus pain, sore throat and vertigo (Patient states that he was tested for Covid on 07/29/20 at [facility] ).  Recent episode started in the past 7 days.  The problem has been gradually improving since onset.  The temperature has been with in normal range.  Patient  is drinking plenty of fluids.  Past hisotry is significant for  no history of pneumonia or bronchitis.       Last edited by Minette Headland, CMA on 07/30/2020  1:45 PM. (History)     he had covid test at [facility] yesterday no result.  He has ear pain, right ear. No drainage.  He has been in [place] and traveling last Sunday was negative for covid.   He has non productive cough.   Patient  denies any fever, body  aches,chills, rash, chest pain, shortness of breath, nausea, vomiting, or diarrhea.     Medications: Outpatient Medications Prior to Visit  Medication Sig  . amLODipine (NORVASC) 5 MG tablet Take 1 tablet (5 mg total) by mouth daily.  Marland Kitchen aspirin EC 81 MG tablet Take 1 tablet (81 mg total) by mouth daily.  . hydrochlorothiazide (HYDRODIURIL) 12.5 MG tablet TAKE 1 TABLET(12.5 MG) BY MOUTH DAILY  . primidone (MYSOLINE) 50 MG tablet TAKE 1 TO 2 TABLETS(50 TO 100 MG) BY MOUTH AT BEDTIME (Patient taking differently: Take 50 mg by mouth in the morning and at bedtime. )  . rosuvastatin (CRESTOR) 5 MG tablet Take 1 tablet (5 mg total) by mouth daily.   No facility-administered medications prior to visit.    Review of Systems    Objective     No vitals available.  Physical Exam    Patient is alert and oriented and responsive to questions Engages in conversation with provider. Speaks in full sentences without any pauses without any shortness of breath or distress.    Assessment & Plan    Non-recurrent acute suppurative otitis media of left ear without spontaneous rupture of tympanic membrane - Plan: azithromycin (ZITHROMAX) 250 MG tablet, predniSONE (STERAPRED UNI-PAK 21 TAB) 10 MG (21) TBPK tablet  Acute non-recurrent pansinusitis - Plan: azithromycin (ZITHROMAX) 250 MG tablet, predniSONE (STERAPRED UNI-PAK 21 TAB) 10 MG (21) TBPK tablet    No orders of the defined types were placed in  this encounter.   Meds ordered this encounter  Medications  . azithromycin (ZITHROMAX) 250 MG tablet    Sig: By mouth Take 2 tablets day 1 (500mg  total) and 1 tablet ( 250 mg ) on days 2,3,4,5.    Dispense:  6 tablet    Refill:  0  . predniSONE (STERAPRED UNI-PAK 21 TAB) 10 MG (21) TBPK tablet    Sig: PO: Take 6 tablets on day 1:Take 5 tablets day 2:Take 4 tablets day 3: Take 3 tablets day 4:Take 2 tablets day five: 5 Take 1 tablet day 6    Dispense:  21 tablet    Refill:  0     Return in about  3 days (around 08/02/2020), or if symptoms worsen or fail to improve, for at any time for any worsening symptoms, Go to Emergency room/ urgent care if worse.     I discussed the assessment and treatment plan with the patient. The patient was provided an opportunity to ask questions and all were answered. The patient agreed with the plan and demonstrated an understanding of the instructions.   The patient was advised to call back or seek an in-person evaluation if the symptoms worsen or if the condition fails to improve as anticipated.   The entirety of the information documented in the History of Present Illness, Review of Systems and Physical Exam were personally obtained by me. Portions of this information were initially documented by the CMA and reviewed by me for thoroughness and accuracy.     Marcille Buffy, [place] [phone] (phone) [phone] (fax)  [place]

## 2020-08-05 NOTE — Patient Instructions (Signed)

## 2020-08-21 DIAGNOSIS — Z23 Encounter for immunization: Secondary | ICD-10-CM | POA: Diagnosis not present

## 2020-08-21 DIAGNOSIS — I639 Cerebral infarction, unspecified: Secondary | ICD-10-CM | POA: Diagnosis not present

## 2020-08-21 DIAGNOSIS — E782 Mixed hyperlipidemia: Secondary | ICD-10-CM | POA: Diagnosis not present

## 2020-08-21 DIAGNOSIS — I10 Essential (primary) hypertension: Secondary | ICD-10-CM | POA: Diagnosis not present

## 2020-08-27 ENCOUNTER — Ambulatory Visit (INDEPENDENT_AMBULATORY_CARE_PROVIDER_SITE_OTHER): Payer: BC Managed Care – PPO | Admitting: Family Medicine

## 2020-08-27 ENCOUNTER — Encounter: Payer: Self-pay | Admitting: Family Medicine

## 2020-08-27 ENCOUNTER — Other Ambulatory Visit: Payer: Self-pay

## 2020-08-27 VITALS — BP 126/80 | HR 71 | Temp 98.4°F | Resp 16 | Ht 69.0 in | Wt 215.0 lb

## 2020-08-27 DIAGNOSIS — E782 Mixed hyperlipidemia: Secondary | ICD-10-CM

## 2020-08-27 DIAGNOSIS — Z125 Encounter for screening for malignant neoplasm of prostate: Secondary | ICD-10-CM | POA: Diagnosis not present

## 2020-08-27 DIAGNOSIS — Z8673 Personal history of transient ischemic attack (TIA), and cerebral infarction without residual deficits: Secondary | ICD-10-CM | POA: Diagnosis not present

## 2020-08-27 DIAGNOSIS — T466X5A Adverse effect of antihyperlipidemic and antiarteriosclerotic drugs, initial encounter: Secondary | ICD-10-CM

## 2020-08-27 DIAGNOSIS — M25511 Pain in right shoulder: Secondary | ICD-10-CM

## 2020-08-27 DIAGNOSIS — M791 Myalgia, unspecified site: Secondary | ICD-10-CM | POA: Insufficient documentation

## 2020-08-27 DIAGNOSIS — I10 Essential (primary) hypertension: Secondary | ICD-10-CM | POA: Diagnosis not present

## 2020-08-27 NOTE — Progress Notes (Signed)
Established patient visit   Patient: Rodney Rojas   DOB: November 30, 1960   60 y.o. Male  MRN: [id number] Visit Date: 08/27/2020  Today's healthcare provider: Lelon Huh, MD   No chief complaint on file.  Subjective    HPI  Lipid/Cholesterol, Follow-up  Last lipid panel Other pertinent labs  Lab Results  Component Value Date   CHOL 113 03/12/2020   HDL 49 03/12/2020   LDLCALC 46 03/12/2020   TRIG 94 03/12/2020   CHOLHDL 2.3 03/12/2020   Lab Results  Component Value Date   ALT 24 08/06/2019   AST 23 08/06/2019   PLT 212 03/12/2020   TSH 1.558 08/06/2019     He was last seen for this 3 months ago.  Management since that visit includes stopping rosuvastatin 5mg  due to muscle pain.  He reports good compliance with treatment. He is having side effects. Patient reports that he is still having muscle pain. He has been taking the CoQ10 and Vit D supplement, but has not seen much improvement in his symptoms. He has had improvement in muscle pains since stopping rosuvastatin. He was previously on atorvastatin with same side effects.   Symptoms: No chest pain No chest pressure/discomfort  No dyspnea No lower extremity edema  No numbness or tingling of extremity No orthopnea  No palpitations No paroxysmal nocturnal dyspnea  No speech difficulty No syncope   Current diet: well balanced Current exercise: walking  The ASCVD Risk score Rodney Rojas., et al., 2013) failed to calculate for the following reasons:   The patient has a prior MI or stroke diagnosis   He also complains of persistent right shoulder pain for the last few years and getting worse. Is painful to lift or raise arm, especially above the level of his shoulder. He has not had any specific injury, but did have to have a steroid shot in the same shoulder several years ago.     Medications: Outpatient Medications Prior to Visit  Medication Sig  . amLODipine (NORVASC) 5 MG tablet Take 1 tablet (5 mg total)  by mouth daily.  Marland Kitchen azithromycin (ZITHROMAX) 250 MG tablet By mouth Take 2 tablets day 1 (500mg  total) and 1 tablet ( 250 mg ) on days 2,3,4,5.  . hydrochlorothiazide (HYDRODIURIL) 12.5 MG tablet TAKE 1 TABLET(12.5 MG) BY MOUTH DAILY  . predniSONE (STERAPRED UNI-PAK 21 TAB) 10 MG (21) TBPK tablet PO: Take 6 tablets on day 1:Take 5 tablets day 2:Take 4 tablets day 3: Take 3 tablets day 4:Take 2 tablets day five: 5 Take 1 tablet day 6  . primidone (MYSOLINE) 50 MG tablet TAKE 1 TO 2 TABLETS(50 TO 100 MG) BY MOUTH AT BEDTIME (Patient taking differently: Take 50 mg by mouth in the morning and at bedtime. )  . rosuvastatin (CRESTOR) 5 MG tablet Take 1 tablet (5 mg total) by mouth daily.   No facility-administered medications prior to visit.    Review of Systems  Constitutional: Negative.   Respiratory: Negative.   Cardiovascular: Negative.   Musculoskeletal: Positive for arthralgias and myalgias.  Neurological: Negative.        Objective    BP 126/80   Pulse 71   Temp 98.4 F (36.9 C)   Resp 16   Ht 5\' 9"  (1.753 m)   Wt 215 lb (97.5 kg)   BMI 31.75 kg/m     Physical Exam   General appearance: Obese male, cooperative and in no acute distress Head: Normocephalic, without obvious  abnormality, atraumatic Respiratory: Respirations even and unlabored, normal respiratory rate Extremities: Tender over right subacromial, positive empty can test.     Assessment & Plan     1. Mixed hyperlipidemia Currently off rosuvastatin about 3 weeks due to myalgias which have improved.  - Comprehensive metabolic panel - Lipid panel  Consider ezetimibe after reviewing labs.   2. Myalgia due to statin Intolerant to rosuvastatin and atorvastatin.   3. History of CVA in adulthood Now doing well. On ECASA.  - Comprehensive metabolic panel - Lipid panel  4. Essential hypertension Well controlled.  Continue current medications.    5. Acute pain of right shoulder  - Ambulatory referral to  Orthopedic Surgery  6. Prostate cancer screening  - PSA Total (Reflex To Free) ([facility] only)   No follow-ups on file.      The entirety of the information documented in the History of Present Illness, Review of Systems and Physical Exam were personally obtained by me. Portions of this information were initially documented by the CMA and reviewed by me for thoroughness and accuracy.      Lelon Huh, MD  [facility] [phone] (phone) [phone] (fax)  [place]

## 2020-08-28 ENCOUNTER — Other Ambulatory Visit: Payer: Self-pay | Admitting: Family Medicine

## 2020-08-28 LAB — COMPREHENSIVE METABOLIC PANEL
ALT: 22 IU/L (ref 0–44)
AST: 19 IU/L (ref 0–40)
Albumin/Globulin Ratio: 2.1 (ref 1.2–2.2)
Albumin: 4.6 g/dL (ref 3.8–4.9)
Alkaline Phosphatase: 60 IU/L (ref 44–121)
BUN/Creatinine Ratio: 13 (ref 9–20)
BUN: 11 mg/dL (ref 6–24)
Bilirubin Total: 0.4 mg/dL (ref 0.0–1.2)
CO2: 25 mmol/L (ref 20–29)
Calcium: 9.2 mg/dL (ref 8.7–10.2)
Chloride: 104 mmol/L (ref 96–106)
Creatinine, Ser: 0.87 mg/dL (ref 0.76–1.27)
GFR calc Af Amer: 109 mL/min/{1.73_m2} (ref >59)
GFR calc non Af Amer: 94 mL/min/{1.73_m2} (ref >59)
Globulin, Total: 2.2 g/dL (ref 1.5–4.5)
Glucose: 96 mg/dL (ref 65–99)
Potassium: 4.5 mmol/L (ref 3.5–5.2)
Sodium: 145 mmol/L — ABNORMAL HIGH (ref 134–144)
Total Protein: 6.8 g/dL (ref 6.0–8.5)

## 2020-08-28 LAB — LIPID PANEL
Chol/HDL Ratio: 2.7 ratio (ref 0.0–5.0)
Cholesterol, Total: 137 mg/dL (ref 100–199)
HDL: 51 mg/dL (ref >39)
LDL Chol Calc (NIH): 68 mg/dL (ref 0–99)
Triglycerides: 96 mg/dL (ref 0–149)
VLDL Cholesterol Cal: 18 mg/dL (ref 5–40)

## 2020-08-28 LAB — PSA TOTAL (REFLEX TO FREE): Prostate Specific Ag, Serum: 2.4 ng/mL (ref 0.0–4.0)

## 2020-09-01 DIAGNOSIS — M7541 Impingement syndrome of right shoulder: Secondary | ICD-10-CM | POA: Diagnosis not present

## 2020-09-05 ENCOUNTER — Other Ambulatory Visit: Payer: Self-pay | Admitting: Family Medicine

## 2020-09-05 DIAGNOSIS — I10 Essential (primary) hypertension: Secondary | ICD-10-CM

## 2020-09-05 NOTE — Telephone Encounter (Signed)
Requested Prescriptions  Pending Prescriptions Disp Refills  . amLODipine (NORVASC) 5 MG tablet [Pharmacy Med Name: AMLODIPINE BESYLATE 5MG  TABLETS] 90 tablet 1    Sig: TAKE 1 TABLET(5 MG) BY MOUTH DAILY     Cardiovascular:  Calcium Channel Blockers Passed - 09/05/2020 10:46 AM      Passed - Last BP in normal range    BP Readings from Last 1 Encounters:  08/27/20 126/80         Passed - Valid encounter within last 6 months    Recent Outpatient Visits          1 week ago Mixed hyperlipidemia   [facility] Birdie Sons, MD   1 month ago Non-recurrent acute suppurative otitis media of left ear without spontaneous rupture of tympanic membrane   [facility] [place], Kelby Aline, FNP   3 months ago [place], MD   5 months ago Essential hypertension   [facility] Birdie Sons, MD   11 months ago Essential hypertension   [place], Kirstie Peri, MD

## 2020-10-02 ENCOUNTER — Other Ambulatory Visit: Payer: Self-pay | Admitting: Family Medicine

## 2020-10-02 DIAGNOSIS — I10 Essential (primary) hypertension: Secondary | ICD-10-CM

## 2020-10-06 ENCOUNTER — Other Ambulatory Visit: Payer: Self-pay | Admitting: Family Medicine

## 2020-10-06 DIAGNOSIS — G250 Essential tremor: Secondary | ICD-10-CM

## 2020-10-06 NOTE — Telephone Encounter (Signed)
Requested medication (s) are due for refill today:   Provider to review  Requested medication (s) are on the active medication list:   Yes  Future visit scheduled:   No   Last ordered: 12/31/2019 #180, 3 refills  Clinic note:  Returned because it's a non delegated refill   Requested Prescriptions  Pending Prescriptions Disp Refills   primidone (MYSOLINE) 50 MG tablet [Pharmacy Med Name: PRIMIDONE 50MG  TABLETS] 180 tablet 3    Sig: TAKE 1 TO 2 TABLETS(50 TO 100 MG) BY MOUTH AT BEDTIME      Not Delegated - Neurology:  Anticonvulsants Failed - 10/06/2020  8:00 AM      Failed - This refill cannot be delegated      Passed - HCT in normal range and within 360 days    Hematocrit  Date Value Ref Range Status  03/12/2020 39.4 37.5 - 51.0 % Final          Passed - HGB in normal range and within 360 days    Hemoglobin  Date Value Ref Range Status  03/12/2020 13.5 13.0 - 17.7 g/dL Final          Passed - PLT in normal range and within 360 days    Platelets  Date Value Ref Range Status  03/12/2020 212 150 - 450 x10E3/uL Final          Passed - WBC in normal range and within 360 days    WBC  Date Value Ref Range Status  03/12/2020 6.6 3.4 - 10.8 x10E3/uL Final  08/06/2019 7.2 4.0 - 10.5 K/uL Final          Passed - Valid encounter within last 12 months    Recent Outpatient Visits           1 month ago Mixed hyperlipidemia   [facility] Birdie Sons, MD   2 months ago Non-recurrent acute suppurative otitis media of left ear without spontaneous rupture of tympanic membrane   [place], Kelby Aline, FNP   4 months ago [place], Donald E, MD   6 months ago Essential hypertension   [facility] Birdie Sons, MD   1 year ago Essential hypertension   [place], Kirstie Peri, MD

## 2020-12-09 ENCOUNTER — Other Ambulatory Visit: Payer: Self-pay | Admitting: Family Medicine

## 2020-12-09 DIAGNOSIS — I10 Essential (primary) hypertension: Secondary | ICD-10-CM

## 2020-12-23 DIAGNOSIS — H579 Unspecified disorder of eye and adnexa: Secondary | ICD-10-CM | POA: Diagnosis not present

## 2020-12-23 DIAGNOSIS — G250 Essential tremor: Secondary | ICD-10-CM | POA: Diagnosis not present

## 2020-12-23 DIAGNOSIS — Z8673 Personal history of transient ischemic attack (TIA), and cerebral infarction without residual deficits: Secondary | ICD-10-CM | POA: Diagnosis not present

## 2020-12-24 ENCOUNTER — Other Ambulatory Visit (HOSPITAL_COMMUNITY): Payer: Self-pay | Admitting: Physician Assistant

## 2020-12-24 ENCOUNTER — Other Ambulatory Visit: Payer: Self-pay | Admitting: Physician Assistant

## 2020-12-24 DIAGNOSIS — I671 Cerebral aneurysm, nonruptured: Secondary | ICD-10-CM

## 2020-12-27 ENCOUNTER — Other Ambulatory Visit: Payer: Self-pay

## 2020-12-27 ENCOUNTER — Ambulatory Visit
Admission: RE | Admit: 2020-12-27 | Discharge: 2020-12-27 | Disposition: A | Discharge status: Home / Self Care | Payer: BC Managed Care – PPO | Source: Ambulatory Visit | Attending: Physician Assistant | Admitting: Physician Assistant

## 2020-12-27 DIAGNOSIS — I7789 Other specified disorders of arteries and arterioles: Secondary | ICD-10-CM | POA: Diagnosis not present

## 2020-12-27 DIAGNOSIS — I671 Cerebral aneurysm, nonruptured: Secondary | ICD-10-CM | POA: Diagnosis not present

## 2020-12-27 DIAGNOSIS — I6501 Occlusion and stenosis of right vertebral artery: Secondary | ICD-10-CM | POA: Diagnosis not present

## 2020-12-27 DIAGNOSIS — I6523 Occlusion and stenosis of bilateral carotid arteries: Secondary | ICD-10-CM | POA: Diagnosis not present

## 2020-12-27 DIAGNOSIS — I6611 Occlusion and stenosis of right anterior cerebral artery: Secondary | ICD-10-CM | POA: Diagnosis not present

## 2020-12-29 ENCOUNTER — Other Ambulatory Visit: Payer: Self-pay | Admitting: Family Medicine

## 2020-12-29 DIAGNOSIS — I10 Essential (primary) hypertension: Secondary | ICD-10-CM

## 2021-01-08 DIAGNOSIS — L538 Other specified erythematous conditions: Secondary | ICD-10-CM | POA: Diagnosis not present

## 2021-01-08 DIAGNOSIS — D2261 Melanocytic nevi of right upper limb, including shoulder: Secondary | ICD-10-CM | POA: Diagnosis not present

## 2021-01-08 DIAGNOSIS — D225 Melanocytic nevi of trunk: Secondary | ICD-10-CM | POA: Diagnosis not present

## 2021-01-08 DIAGNOSIS — D2262 Melanocytic nevi of left upper limb, including shoulder: Secondary | ICD-10-CM | POA: Diagnosis not present

## 2021-01-08 DIAGNOSIS — L570 Actinic keratosis: Secondary | ICD-10-CM | POA: Diagnosis not present

## 2021-01-08 DIAGNOSIS — D2271 Melanocytic nevi of right lower limb, including hip: Secondary | ICD-10-CM | POA: Diagnosis not present

## 2021-01-08 DIAGNOSIS — L820 Inflamed seborrheic keratosis: Secondary | ICD-10-CM | POA: Diagnosis not present

## 2021-01-08 DIAGNOSIS — X32XXXA Exposure to sunlight, initial encounter: Secondary | ICD-10-CM | POA: Diagnosis not present

## 2021-01-28 DIAGNOSIS — Z8673 Personal history of transient ischemic attack (TIA), and cerebral infarction without residual deficits: Secondary | ICD-10-CM | POA: Diagnosis not present

## 2021-02-13 ENCOUNTER — Encounter: Payer: Self-pay | Admitting: Family Medicine

## 2021-02-24 ENCOUNTER — Encounter: Payer: Self-pay | Admitting: Family Medicine

## 2021-02-24 MED ORDER — EPINEPHRINE 0.3 MG/0.3ML IJ SOAJ: 0.3000 mg | Freq: Once | INTRAMUSCULAR | 1 refills | Status: DC

## 2021-02-27 DIAGNOSIS — I10 Essential (primary) hypertension: Secondary | ICD-10-CM | POA: Diagnosis not present

## 2021-02-27 DIAGNOSIS — I639 Cerebral infarction, unspecified: Secondary | ICD-10-CM | POA: Diagnosis not present

## 2021-02-27 DIAGNOSIS — E782 Mixed hyperlipidemia: Secondary | ICD-10-CM | POA: Diagnosis not present

## 2021-03-23 ENCOUNTER — Other Ambulatory Visit: Payer: Self-pay | Admitting: Family Medicine

## 2021-03-29 ENCOUNTER — Other Ambulatory Visit: Payer: Self-pay | Admitting: Family Medicine

## 2021-03-29 DIAGNOSIS — I10 Essential (primary) hypertension: Secondary | ICD-10-CM

## 2021-03-29 NOTE — Telephone Encounter (Signed)
Requested medication (s) are due for refill today: yes  Requested medication (s) are on the active medication list: yes  Last refill:  12/29/20 #90  Future visit scheduled: no  Notes to clinic:  called pt and LM on VM to call office to make an appt for BP   Requested Prescriptions  Pending Prescriptions Disp Refills   hydrochlorothiazide (HYDRODIURIL) 12.5 MG tablet [Pharmacy Med Name: HYDROCHLOROTHIAZIDE 12.'5MG'$  TABLETS] 90 tablet 0    Sig: TAKE 1 TABLET(12.5 MG) BY MOUTH DAILY     Cardiovascular: Diuretics - Thiazide Failed - 03/29/2021 10:46 AM      Failed - Na in normal range and within 360 days    Sodium  Date Value Ref Range Status  08/27/2020 145 (H) 134 - 144 mmol/L Final  12/04/2013 141 136 - 145 mmol/L Final          Failed - Valid encounter within last 6 months    Recent Outpatient Visits           7 months ago Mixed hyperlipidemia   [facility] Birdie Sons, MD   8 months ago Non-recurrent acute suppurative otitis media of left ear without spontaneous rupture of tympanic membrane   [place], Kelby Aline, FNP   10 months ago [place], Donald E, MD   1 year ago Essential hypertension   [facility] Birdie Sons, MD   1 year ago Essential hypertension   [facility] Birdie Sons, MD              Passed - Ca in normal range and within 360 days    Calcium  Date Value Ref Range Status  08/27/2020 9.2 8.7 - 10.2 mg/dL Final   Calcium, Total  Date Value Ref Range Status  12/04/2013 9.2 8.5 - 10.1 mg/dL Final          Passed - Cr in normal range and within 360 days    Creatinine  Date Value Ref Range Status  12/04/2013 0.86 0.60 - 1.30 mg/dL Final   Creatinine, Ser  Date Value Ref Range Status  08/27/2020 0.87 0.76 - 1.27 mg/dL Final          Passed - K in normal range and within 360 days    Potassium  Date Value Ref Range Status   08/27/2020 4.5 3.5 - 5.2 mmol/L Final  12/04/2013 3.6 3.5 - 5.1 mmol/L Final          Passed - Last BP in normal range    BP Readings from Last 1 Encounters:  08/27/20 126/80

## 2021-03-30 ENCOUNTER — Other Ambulatory Visit: Payer: Self-pay | Admitting: Family Medicine

## 2021-03-30 DIAGNOSIS — I10 Essential (primary) hypertension: Secondary | ICD-10-CM

## 2021-03-30 NOTE — Telephone Encounter (Signed)
   Notes to clinic:   Patient requests 90 days supply  Requested Prescriptions  Pending Prescriptions Disp Refills   hydrochlorothiazide (HYDRODIURIL) 12.5 MG tablet [Pharmacy Med Name: HYDROCHLOROTHIAZIDE 12.'5MG'$  TABLETS] 90 tablet     Sig: TAKE 1 TABLET(12.5 MG) BY MOUTH DAILY NEED OFFICE VISIT     Cardiovascular: Diuretics - Thiazide Failed - 03/30/2021  9:30 AM      Failed - Na in normal range and within 360 days    Sodium  Date Value Ref Range Status  08/27/2020 145 (H) 134 - 144 mmol/L Final  12/04/2013 141 136 - 145 mmol/L Final          Failed - Valid encounter within last 6 months    Recent Outpatient Visits           7 months ago Mixed hyperlipidemia   [facility] Birdie Sons, MD   8 months ago Non-recurrent acute suppurative otitis media of left ear without spontaneous rupture of tympanic membrane   [facility] Flinch[place], Kelby Aline, FNP   10 months ago [place], Donald E, MD   1 year ago Essential hypertension   [facility] Birdie Sons, MD   1 year ago Essential hypertension   [facility] Birdie Sons, MD              Passed - Ca in normal range and within 360 days    Calcium  Date Value Ref Range Status  08/27/2020 9.2 8.7 - 10.2 mg/dL Final   Calcium, Total  Date Value Ref Range Status  12/04/2013 9.2 8.5 - 10.1 mg/dL Final          Passed - Cr in normal range and within 360 days    Creatinine  Date Value Ref Range Status  12/04/2013 0.86 0.60 - 1.30 mg/dL Final   Creatinine, Ser  Date Value Ref Range Status  08/27/2020 0.87 0.76 - 1.27 mg/dL Final          Passed - K in normal range and within 360 days    Potassium  Date Value Ref Range Status  08/27/2020 4.5 3.5 - 5.2 mmol/L Final  12/04/2013 3.6 3.5 - 5.1 mmol/L Final          Passed - Last BP in normal range    BP Readings from Last 1 Encounters:  08/27/20 126/80

## 2021-03-30 NOTE — Telephone Encounter (Signed)
Patient requesting a 90 day supply

## 2021-04-07 ENCOUNTER — Ambulatory Visit (INDEPENDENT_AMBULATORY_CARE_PROVIDER_SITE_OTHER): Payer: BC Managed Care – PPO

## 2021-04-07 ENCOUNTER — Encounter: Payer: Self-pay | Admitting: Podiatry

## 2021-04-07 ENCOUNTER — Other Ambulatory Visit: Payer: Self-pay

## 2021-04-07 ENCOUNTER — Ambulatory Visit: Payer: BC Managed Care – PPO | Admitting: Podiatry

## 2021-04-07 DIAGNOSIS — B351 Tinea unguium: Secondary | ICD-10-CM

## 2021-04-07 DIAGNOSIS — M722 Plantar fascial fibromatosis: Secondary | ICD-10-CM

## 2021-04-07 DIAGNOSIS — M79674 Pain in right toe(s): Secondary | ICD-10-CM | POA: Diagnosis not present

## 2021-04-07 MED ORDER — BETAMETHASONE SOD PHOS & ACET 6 (3-3) MG/ML IJ SUSP
3.0000 mg | Freq: Once | INTRAMUSCULAR | Status: AC
  Administered 2021-04-07: 3 mg via INTRA_ARTICULAR

## 2021-04-07 MED ORDER — TERBINAFINE HCL 250 MG PO TABS: 250.0000 mg | ORAL_TABLET | Freq: Every day | ORAL | 0 refills | Status: DC

## 2021-04-07 MED ORDER — MELOXICAM 15 MG PO TABS: 15.0000 mg | ORAL_TABLET | Freq: Every day | ORAL | 1 refills | Status: DC

## 2021-04-07 NOTE — Progress Notes (Signed)
   Subjective: 60 y.o. male presenting today as a new patient for evaluation of right heel pain and discoloration with thickening to the right great toenail plate.  He says that the right heel pain is been going on for a few months now it is very sensitive and tender.  He also says that he goes to [place] for work-related trips and most recently he believes that he has noticed some discoloration and thickening to the right hallux nail plate.  He does have a history of onychomycosis which was resolved with oral Lamisil.  He presents for further treatment evaluation   Past Medical History:  Diagnosis Date   Arthritis    Complication of anesthesia    slow to wake up after colonoscopy   GERD (gastroesophageal reflux disease)    Headache    History of chicken pox    History of kidney stones    Prostate enlargement    Tremors of nervous system      Objective: Physical Exam General: The patient is alert and oriented x3 in no acute distress.  Dermatology: Skin is warm, dry and supple bilateral lower extremities. Negative for open lesions or macerations bilateral.  Hyperkeratotic discolored dystrophic toenail noted to the right great toe  Vascular: Dorsalis Pedis and Posterior Tibial pulses palpable bilateral.  Capillary fill time is immediate to all digits.  Neurological: Epicritic and protective threshold intact bilateral.   Musculoskeletal: Tenderness to palpation to the plantar aspect of the right heel along the plantar fascia. All other joints range of motion within normal limits bilateral. Strength 5/5 in all groups bilateral.   Radiographic exam: Normal osseous mineralization. Joint spaces preserved. No fracture/dislocation/boney destruction. No other soft tissue abnormalities or radiopaque foreign bodies.   Assessment: 1. Plantar fasciitis right 2.  Onychomycosis of the toenail right hallux  Plan of Care:  1. Patient evaluated. Xrays reviewed.   2. Injection of 0.5cc Celestone  soluspan injected into the right plantar fascia  3.  Today we discussed different treatment options including oral, topical, and laser antifungal treatment modalities regarding onychomycosis of the toenail.  The patient has had good success in the past with oral antifungal medication.  He opts for the Lamisil.  Prescription for Lamisil 250 mg #90 daily.  He denies a history of liver pathology or symptoms 4. Rx for Meloxicam ordered for patient. 5. Plantar fascial band(s) dispensed 6. Instructed patient regarding therapies and modalities at home to alleviate symptoms.  7.  Recommend OTC power step insoles available on [employer]  8.  Return to clinic in 4 weeks.       Edrick Kins, DPM [facility]  Dr. Edrick Kins, DPM    [address], [place] [address]                Office [phone]  Fax [phone]

## 2021-04-14 ENCOUNTER — Other Ambulatory Visit: Payer: Self-pay | Admitting: Family Medicine

## 2021-04-14 DIAGNOSIS — I10 Essential (primary) hypertension: Secondary | ICD-10-CM

## 2021-04-14 NOTE — Telephone Encounter (Signed)
Copied from [place] [id number]. Topic: Quick Communication - Rx Refill/Question >> Apr 14, 2021  9:40 AM Leward Quan A wrote: Medication: hydrochlorothiazide (HYDRODIURIL) 12.5 MG tablet  Has the patient contacted their pharmacy? Yes.   (Agent: If no, request that the patient contact the pharmacy for the refill.) (Agent: If yes, when and what did the pharmacy advise?)  Preferred Pharmacy (with phone number or street name): [id number] - [place], [place] - [place]  Phone:  [phone] Fax:  [phone]     Agent: Please be advised that RX refills may take up to 3 business days. We ask that you follow-up with your pharmacy.

## 2021-04-15 ENCOUNTER — Encounter: Payer: Self-pay | Admitting: Family Medicine

## 2021-04-15 DIAGNOSIS — Z86010 Personal history of colonic polyps: Secondary | ICD-10-CM

## 2021-04-15 DIAGNOSIS — Z1211 Encounter for screening for malignant neoplasm of colon: Secondary | ICD-10-CM

## 2021-04-15 MED ORDER — HYDROCHLOROTHIAZIDE 12.5 MG PO TABS: ORAL_TABLET | ORAL | 0 refills | Status: DC

## 2021-04-15 NOTE — Telephone Encounter (Signed)
Please review.  Is it okay to refer? ° °Thanks,  ° °-Rollande Thursby  °

## 2021-04-15 NOTE — Telephone Encounter (Signed)
Patient needs to keep upcoming appointment. Requested Prescriptions  Pending Prescriptions Disp Refills  . hydrochlorothiazide (HYDRODIURIL) 12.5 MG tablet 30 tablet 0    Sig: TAKE 1 TABLET(12.5 MG) BY MOUTH DAILY     Cardiovascular: Diuretics - Thiazide Failed - 04/14/2021 10:41 PM      Failed - Na in normal range and within 360 days    Sodium  Date Value Ref Range Status  08/27/2020 145 (H) 134 - 144 mmol/L Final  12/04/2013 141 136 - 145 mmol/L Final         Failed - Valid encounter within last 6 months    Recent Outpatient Visits          7 months ago Mixed hyperlipidemia   [facility] Birdie Sons, MD   8 months ago Non-recurrent acute suppurative otitis media of left ear without spontaneous rupture of tympanic membrane   [place], Kelby Aline, FNP   10 months ago [place], Donald E, MD   1 year ago Essential hypertension   [facility] Birdie Sons, MD   1 year ago Essential hypertension   [facility] Birdie Sons, MD      Future Appointments            In 1 month [place], Kirstie Peri, MD [facility] [place] in normal range and within 360 days    Calcium  Date Value Ref Range Status  08/27/2020 9.2 8.7 - 10.2 mg/dL Final   Calcium, Total  Date Value Ref Range Status  12/04/2013 9.2 8.5 - 10.1 mg/dL Final         Passed - Cr in normal range and within 360 days    Creatinine  Date Value Ref Range Status  12/04/2013 0.86 0.60 - 1.30 mg/dL Final   Creatinine, Ser  Date Value Ref Range Status  08/27/2020 0.87 0.76 - 1.27 mg/dL Final         Passed - K in normal range and within 360 days    Potassium  Date Value Ref Range Status  08/27/2020 4.5 3.5 - 5.2 mmol/L Final  12/04/2013 3.6 3.5 - 5.1 mmol/L Final         Passed - Last BP in normal range    BP Readings from Last 1 Encounters:  08/27/20 126/80

## 2021-04-21 DIAGNOSIS — X32XXXA Exposure to sunlight, initial encounter: Secondary | ICD-10-CM | POA: Diagnosis not present

## 2021-04-21 DIAGNOSIS — L820 Inflamed seborrheic keratosis: Secondary | ICD-10-CM | POA: Diagnosis not present

## 2021-04-21 DIAGNOSIS — L570 Actinic keratosis: Secondary | ICD-10-CM | POA: Diagnosis not present

## 2021-04-21 DIAGNOSIS — L309 Dermatitis, unspecified: Secondary | ICD-10-CM | POA: Diagnosis not present

## 2021-04-21 DIAGNOSIS — L538 Other specified erythematous conditions: Secondary | ICD-10-CM | POA: Diagnosis not present

## 2021-04-21 DIAGNOSIS — R208 Other disturbances of skin sensation: Secondary | ICD-10-CM | POA: Diagnosis not present

## 2021-04-23 ENCOUNTER — Other Ambulatory Visit: Payer: Self-pay | Admitting: Family Medicine

## 2021-04-23 DIAGNOSIS — I10 Essential (primary) hypertension: Secondary | ICD-10-CM

## 2021-04-27 ENCOUNTER — Other Ambulatory Visit: Payer: Self-pay

## 2021-04-27 DIAGNOSIS — Z860100 Personal history of colon polyps, unspecified: Secondary | ICD-10-CM

## 2021-04-27 MED ORDER — NA SULFATE-K SULFATE-MG SULF 17.5-3.13-1.6 GM/177ML PO SOLN: 1.0000 | Freq: Once | ORAL | 0 refills | Status: AC

## 2021-04-27 NOTE — Progress Notes (Signed)
Gastroenterology Pre-Procedure Review  Request Date: 06/01/2021 Requesting Physician: Dr. Allen Norris  PATIENT REVIEW QUESTIONS: The patient responded to the following health history questions as indicated:    1. Are you having any GI issues? no 2. Do you have a personal history of Polyps? yes (01/09/2013) 3. Do you have a family history of Colon Cancer or Polyps? no 4. Diabetes Mellitus? no 5. Joint replacements in the past 12 months?no 6. Major health problems in the past 3 months?no 7. Any artificial heart valves, MVP, or defibrillator?yes (Loop recorder insertion 03/19/2020)    MEDICATIONS & ALLERGIES:    Patient reports the following regarding taking any anticoagulation/antiplatelet therapy:   Plavix, Coumadin, Eliquis, Xarelto, Lovenox, Pradaxa, Brilinta, or Effient? no Aspirin? yes (81 mg)  Patient confirms/reports the following medications:  Current Outpatient Medications  Medication Sig Dispense Refill   amLODipine (NORVASC) 5 MG tablet Take 1 tablet (5 mg total) by mouth daily. 90 tablet 3   aspirin 81 MG chewable tablet Chew by mouth.     Cholecalciferol 25 MCG (1000 UT) tablet Take by mouth.     Coenzyme Q10 100 MG capsule Take by mouth.     EPINEPHRINE 0.3 mg/0.3 mL IJ SOAJ injection ADMINISTER 0.3 MG IN THE MUSCLE 1 TIME FOR 1 DOSE 2 each 1   hydrochlorothiazide (HYDRODIURIL) 12.5 MG tablet TAKE 1 TABLET(12.5 MG) BY MOUTH DAILY KEEP APPOINTMENT 90 tablet 0   meloxicam (MOBIC) 15 MG tablet Take 1 tablet (15 mg total) by mouth daily. 30 tablet 1   primidone (MYSOLINE) 50 MG tablet Take 1-2 tablets (50-100 mg total) by mouth at bedtime. TAKE 1 TO 2 TABLETS(50 TO 100 MG) BY MOUTH AT BEDTIME 180 tablet 3   terbinafine (LAMISIL) 250 MG tablet Take 1 tablet (250 mg total) by mouth daily. 90 tablet 0   No current facility-administered medications for this visit.    Patient confirms/reports the following allergies:  Allergies  Allergen Reactions   Bee Venom Anaphylaxis, Itching  and Other (See Comments)    Bee stings   Atorvastatin     Muscles pains   Rosuvastatin     Muscle pain    No orders of the defined types were placed in this encounter.   AUTHORIZATION INFORMATION Primary Insurance: 1D#: Group #:  Secondary Insurance: 1D#: Group #:  SCHEDULE INFORMATION: Date: 06/01/21 Time: Location: [place]

## 2021-04-30 ENCOUNTER — Encounter: Payer: Self-pay | Admitting: Podiatry

## 2021-04-30 ENCOUNTER — Other Ambulatory Visit: Payer: Self-pay

## 2021-04-30 ENCOUNTER — Telehealth: Payer: Self-pay

## 2021-04-30 MED ORDER — PEG 3350-KCL-NA BICARB-NACL 420 G PO SOLR: 4000.0000 mL | Freq: Once | ORAL | 0 refills | Status: AC

## 2021-04-30 NOTE — Progress Notes (Signed)
Patient requested a cheaper prep for procedure. Prep sent to pharmacy of choice.

## 2021-04-30 NOTE — Telephone Encounter (Signed)
Now pt. Is calling back saying that prep is not covered by insurance. He is requesting a call back to discuss a different prep.

## 2021-04-30 NOTE — Telephone Encounter (Signed)
Returned patients call. Left instructions for how to drink bowel prep on voicemail.

## 2021-04-30 NOTE — Telephone Encounter (Signed)
Will send a cheaper prep to pharmacy.

## 2021-04-30 NOTE — Telephone Encounter (Signed)
Pt. Has question about prep

## 2021-05-03 ENCOUNTER — Other Ambulatory Visit: Payer: Self-pay | Admitting: Family Medicine

## 2021-05-03 DIAGNOSIS — I10 Essential (primary) hypertension: Secondary | ICD-10-CM

## 2021-05-06 ENCOUNTER — Other Ambulatory Visit: Payer: Self-pay | Admitting: Podiatry

## 2021-05-12 ENCOUNTER — Ambulatory Visit: Payer: BC Managed Care – PPO | Admitting: Podiatry

## 2021-05-12 ENCOUNTER — Encounter: Payer: Self-pay | Admitting: Gastroenterology

## 2021-05-12 ENCOUNTER — Other Ambulatory Visit: Payer: Self-pay

## 2021-05-26 ENCOUNTER — Encounter: Payer: Self-pay | Admitting: Family Medicine

## 2021-05-26 ENCOUNTER — Ambulatory Visit (INDEPENDENT_AMBULATORY_CARE_PROVIDER_SITE_OTHER): Payer: BC Managed Care – PPO | Admitting: Family Medicine

## 2021-05-26 ENCOUNTER — Other Ambulatory Visit: Payer: Self-pay

## 2021-05-26 VITALS — BP 134/94 | HR 63 | Temp 98.0°F | Wt 219.0 lb

## 2021-05-26 DIAGNOSIS — I10 Essential (primary) hypertension: Secondary | ICD-10-CM

## 2021-05-26 DIAGNOSIS — M2550 Pain in unspecified joint: Secondary | ICD-10-CM | POA: Diagnosis not present

## 2021-05-26 DIAGNOSIS — Z23 Encounter for immunization: Secondary | ICD-10-CM | POA: Diagnosis not present

## 2021-05-26 DIAGNOSIS — E782 Mixed hyperlipidemia: Secondary | ICD-10-CM | POA: Diagnosis not present

## 2021-05-26 NOTE — Addendum Note (Signed)
Addended by: Ashley Royalty E on: 05/26/2021 03:13 PM   Modules accepted: Orders

## 2021-05-26 NOTE — Progress Notes (Signed)
Established patient visit   Patient: Rodney Rojas   DOB: [birth year]   60 y.o. Male  MRN: [id number] Visit Date: 05/26/2021  Today's healthcare provider: Lelon Huh, MD   No chief complaint on file.  Subjective    HPI Hypertension, follow-up  BP Readings from Last 3 Encounters:  05/26/21 (!) 134/94  08/27/20 126/80  07/30/20 (!) 109/41   Wt Readings from Last 3 Encounters:  05/26/21 219 lb (99.3 kg)  08/27/20 215 lb (97.5 kg)  05/30/20 215 lb (97.5 kg)     He was last seen for hypertension 10 months ago.  BP at that visit was 126/80. Management since that visit includes no changes.  He reports excellent compliance with treatment. He is not having side effects.  He is following a Regular diet. He is exercising. He does not smoke.  Use of agents associated with hypertension: none.   Outside blood pressures are checked occasionally pt states is BP is normal at home. Symptoms: No chest pain No chest pressure  No palpitations No syncope  No dyspnea No orthopnea  No paroxysmal nocturnal dyspnea No lower extremity edema   Pertinent labs: Lab Results  Component Value Date   CHOL 137 08/27/2020   HDL 51 08/27/2020   LDLCALC 68 08/27/2020   TRIG 96 08/27/2020   CHOLHDL 2.7 08/27/2020   Lab Results  Component Value Date   NA 145 (H) 08/27/2020   K 4.5 08/27/2020   CREATININE 0.87 08/27/2020   GFRNONAA 94 08/27/2020   GLUCOSE 96 08/27/2020     The ASCVD Risk score (Arnett DK, et al., 2019) failed to calculate for the following reasons:   The patient has a prior MI or stroke diagnosis   --------------------------------------------------------------------------------------------------- Lipid/Cholesterol, Follow-up  Last lipid panel Other pertinent labs  Lab Results  Component Value Date   CHOL 137 08/27/2020   HDL 51 08/27/2020   LDLCALC 68 08/27/2020   TRIG 96 08/27/2020   CHOLHDL 2.7 08/27/2020   Lab Results  Component Value Date   ALT 22  08/27/2020   AST 19 08/27/2020   PLT 212 03/12/2020   TSH 1.558 08/06/2019     He was last seen for this 10 months ago.  Management since that visit includes discontinuing statin due to myalgias.  He reports excellent compliance with treatment. He is not having side effects.   Symptoms: No chest pain No chest pressure/discomfort  No dyspnea No lower extremity edema  No numbness or tingling of extremity No orthopnea  No palpitations No paroxysmal nocturnal dyspnea  No speech difficulty No syncope   Current diet: unhealthy, pt travels for work.  Current exercise: none  The ASCVD Risk score (Arnett DK, et al., 2019) failed to calculate for the following reasons:   The patient has a prior MI or stroke diagnosis  ---------------------------------------------------------------------------------------------------  Myalgias He has been having aching in shoulders and knees for the last few years. Was attributing to atorvastatin and rosuvastatin. However since stopping rosuvastatin earlier this year and he did not notice any improvement. However he was prescribed meloxicam for plantar fasciitis about 3 weeks ago and states pains have since dramatically improved.    Medications: Outpatient Medications Prior to Visit  Medication Sig   amLODipine (NORVASC) 5 MG tablet Take 1 tablet (5 mg total) by mouth daily.   aspirin 81 MG chewable tablet Chew by mouth.   Cholecalciferol 25 MCG (1000 UT) tablet Take by mouth.   Coenzyme Q10 100 MG capsule  Take by mouth.   EPINEPHRINE 0.3 mg/0.3 mL IJ SOAJ injection ADMINISTER 0.3 MG IN THE MUSCLE 1 TIME FOR 1 DOSE   hydrochlorothiazide (HYDRODIURIL) 12.5 MG tablet TAKE 1 TABLET(12.5 MG) BY MOUTH DAILY KEEP APPOINTMENT   meloxicam (MOBIC) 15 MG tablet TAKE 1 TABLET(15 MG) BY MOUTH DAILY   primidone (MYSOLINE) 50 MG tablet Take 1-2 tablets (50-100 mg total) by mouth at bedtime. TAKE 1 TO 2 TABLETS(50 TO 100 MG) BY MOUTH AT BEDTIME   terbinafine  (LAMISIL) 250 MG tablet Take 1 tablet (250 mg total) by mouth daily.   No facility-administered medications prior to visit.    Review of Systems  Constitutional: Negative.   Respiratory: Negative.    Cardiovascular: Negative.   Gastrointestinal: Negative.   Musculoskeletal:  Positive for arthralgias (Pt states meloxicam is helping with the pain.) and myalgias. Negative for back pain, gait problem, joint swelling, neck pain and neck stiffness.  Neurological:  Negative for dizziness, syncope, speech difficulty, weakness, light-headedness, numbness and headaches.      Objective    BP (!) 134/94 (BP Location: Right Arm, Patient Position: Sitting, Cuff Size: Large)   Pulse 63   Temp 98 F (36.7 C) (Oral)   Wt 219 lb (99.3 kg)   SpO2 99%   BMI 31.42 kg/m    Physical Exam  General appearance: Mildly obese male, cooperative and in no acute distress Head: Normocephalic, without obvious abnormality, atraumatic Respiratory: Respirations even and unlabored, normal respiratory rate Extremities: All extremities are intact.  Skin: Skin color, texture, turgor normal. No rashes seen  Psych: Appropriate mood and affect. Neurologic: Mental status: Alert, oriented to person, place, and time, thought content appropriate.     Assessment & Plan     1. Essential hypertension Doing well on hctz but not optimally improved. Consider adding ARB after reviewing labs.   2. Mixed hyperlipidemia Off statin due to myalgias, but did not significantly improve until recently started on meloxicam for plantar fasciitis.  - Lipid panel - Comprehensive metabolic panel  3. Arthralgia, unspecified joint Greatly improved on meloxicam as above. He is going to try OTC Tumeric and may need long term NSAID         The entirety of the information documented in the History of Present Illness, Review of Systems and Physical Exam were personally obtained by me. Portions of this information were initially  documented by the CMA and reviewed by me for thoroughness and accuracy.     Lelon Huh, MD  [facility] [phone] (phone) [phone] (fax)  [place]

## 2021-05-26 NOTE — Patient Instructions (Addendum)
Please review the attached list of medications and notify my office if there are any errors.   I strongly recommend a Covid bivalent (omicron) booster if it's been more than 2 months since your last covid booster or infection.     You can try over the counter Tumeric which is a natural anti-inflammatory supplement  I recommend that you get a flu vaccine this year. Please call our office at [phone] at your earliest convenience to schedule a flu shot.

## 2021-05-27 ENCOUNTER — Other Ambulatory Visit: Payer: Self-pay | Admitting: Family Medicine

## 2021-05-27 LAB — COMPREHENSIVE METABOLIC PANEL
ALT: 21 IU/L (ref 0–44)
AST: 15 IU/L (ref 0–40)
Albumin/Globulin Ratio: 2.2 (ref 1.2–2.2)
Albumin: 4.7 g/dL (ref 3.8–4.9)
Alkaline Phosphatase: 59 IU/L (ref 44–121)
BUN/Creatinine Ratio: 14 (ref 10–24)
BUN: 13 mg/dL (ref 8–27)
Bilirubin Total: 0.4 mg/dL (ref 0.0–1.2)
CO2: 25 mmol/L (ref 20–29)
Calcium: 9.5 mg/dL (ref 8.6–10.2)
Chloride: 103 mmol/L (ref 96–106)
Creatinine, Ser: 0.93 mg/dL (ref 0.76–1.27)
Globulin, Total: 2.1 g/dL (ref 1.5–4.5)
Glucose: 101 mg/dL — ABNORMAL HIGH (ref 70–99)
Potassium: 4.5 mmol/L (ref 3.5–5.2)
Sodium: 143 mmol/L (ref 134–144)
Total Protein: 6.8 g/dL (ref 6.0–8.5)
eGFR: 94 mL/min/{1.73_m2} (ref >59)

## 2021-05-27 LAB — LIPID PANEL
Chol/HDL Ratio: 3.3 ratio (ref 0.0–5.0)
Cholesterol, Total: 186 mg/dL (ref 100–199)
HDL: 56 mg/dL (ref >39)
LDL Chol Calc (NIH): 106 mg/dL — ABNORMAL HIGH (ref 0–99)
Triglycerides: 137 mg/dL (ref 0–149)
VLDL Cholesterol Cal: 24 mg/dL (ref 5–40)

## 2021-05-27 MED ORDER — PRAVASTATIN SODIUM 20 MG PO TABS: 20.0000 mg | ORAL_TABLET | Freq: Every day | ORAL | 0 refills | Status: DC

## 2021-05-27 MED ORDER — VALSARTAN 40 MG PO TABS: 40.0000 mg | ORAL_TABLET | Freq: Every day | ORAL | 0 refills | Status: DC

## 2021-05-28 ENCOUNTER — Other Ambulatory Visit: Payer: Self-pay | Admitting: Family Medicine

## 2021-06-01 ENCOUNTER — Encounter
Admission: RE | Disposition: A | Discharge status: Home / Self Care | Payer: Self-pay | Source: Home / Self Care | Attending: Gastroenterology

## 2021-06-01 ENCOUNTER — Ambulatory Visit: Payer: BC Managed Care – PPO | Admitting: Anesthesiology

## 2021-06-01 ENCOUNTER — Encounter: Payer: Self-pay | Admitting: Gastroenterology

## 2021-06-01 ENCOUNTER — Ambulatory Visit
Admission: RE | Admit: 2021-06-01 | Discharge: 2021-06-01 | Disposition: A | Discharge status: Home / Self Care | Payer: BC Managed Care – PPO | Attending: Gastroenterology | Admitting: Gastroenterology

## 2021-06-01 ENCOUNTER — Other Ambulatory Visit: Payer: Self-pay

## 2021-06-01 DIAGNOSIS — K5730 Diverticulosis of large intestine without perforation or abscess without bleeding: Secondary | ICD-10-CM | POA: Diagnosis not present

## 2021-06-01 DIAGNOSIS — Z86010 Personal history of colonic polyps: Secondary | ICD-10-CM | POA: Insufficient documentation

## 2021-06-01 DIAGNOSIS — K640 First degree hemorrhoids: Secondary | ICD-10-CM | POA: Insufficient documentation

## 2021-06-01 DIAGNOSIS — Z79899 Other long term (current) drug therapy: Secondary | ICD-10-CM | POA: Insufficient documentation

## 2021-06-01 DIAGNOSIS — Z888 Allergy status to other drugs, medicaments and biological substances status: Secondary | ICD-10-CM | POA: Insufficient documentation

## 2021-06-01 DIAGNOSIS — Z7982 Long term (current) use of aspirin: Secondary | ICD-10-CM | POA: Insufficient documentation

## 2021-06-01 DIAGNOSIS — Z1211 Encounter for screening for malignant neoplasm of colon: Secondary | ICD-10-CM | POA: Insufficient documentation

## 2021-06-01 DIAGNOSIS — D128 Benign neoplasm of rectum: Secondary | ICD-10-CM | POA: Diagnosis not present

## 2021-06-01 DIAGNOSIS — Z791 Long term (current) use of non-steroidal anti-inflammatories (NSAID): Secondary | ICD-10-CM | POA: Diagnosis not present

## 2021-06-01 DIAGNOSIS — K621 Rectal polyp: Secondary | ICD-10-CM | POA: Diagnosis not present

## 2021-06-01 HISTORY — PX: COLONOSCOPY WITH PROPOFOL: SHX5780

## 2021-06-01 HISTORY — PX: POLYPECTOMY: SHX5525

## 2021-06-01 SURGERY — COLONOSCOPY WITH PROPOFOL
Anesthesia: General | Site: Rectum

## 2021-06-01 MED ORDER — STERILE WATER FOR IRRIGATION IR SOLN
Status: DC | PRN
  Administered 2021-06-01: 1

## 2021-06-01 MED ORDER — PROPOFOL 10 MG/ML IV BOLUS
INTRAVENOUS | Status: DC | PRN
Start: 2021-06-01 — End: 2021-06-01
  Administered 2021-06-01: 40 mg via INTRAVENOUS
  Administered 2021-06-01: 90 mg via INTRAVENOUS
  Administered 2021-06-01 (×6): 20 mg via INTRAVENOUS

## 2021-06-01 MED ORDER — STERILE WATER FOR IRRIGATION IR SOLN
Status: DC | PRN
  Administered 2021-06-01: 60 mL

## 2021-06-01 MED ORDER — LIDOCAINE HCL (CARDIAC) PF 100 MG/5ML IV SOSY
PREFILLED_SYRINGE | INTRAVENOUS | Status: DC | PRN
  Administered 2021-06-01: 50 mg via INTRAVENOUS

## 2021-06-01 MED ORDER — SODIUM CHLORIDE 0.9 % IV SOLN: INTRAVENOUS | Status: DC

## 2021-06-01 MED ORDER — LACTATED RINGERS IV SOLN: INTRAVENOUS | Status: DC

## 2021-06-01 SURGICAL SUPPLY — 22 items
CLIP HMST 235XBRD CATH ROT (MISCELLANEOUS) IMPLANT
CLIP RESOLUTION 360 11X235 (MISCELLANEOUS)
ELECT REM PT RETURN 9FT ADLT (ELECTROSURGICAL)
ELECTRODE REM PT RTRN 9FT ADLT (ELECTROSURGICAL) IMPLANT
FORCEPS BIOP RAD 4 LRG CAP 4 (CUTTING FORCEPS) ×2 IMPLANT
GOWN CVR UNV OPN BCK APRN NK (MISCELLANEOUS) ×2 IMPLANT
GOWN ISOL THUMB LOOP REG UNIV (MISCELLANEOUS) ×4
INJECTOR VARIJECT VIN23 (MISCELLANEOUS) IMPLANT
KIT DEFENDO VALVE AND CONN (KITS) IMPLANT
KIT PRC NS LF DISP ENDO (KITS) ×1 IMPLANT
KIT PROCEDURE OLYMPUS (KITS) ×2
MANIFOLD NEPTUNE II (INSTRUMENTS) ×2 IMPLANT
MARKER SPOT ENDO TATTOO 5ML (MISCELLANEOUS) IMPLANT
PROBE APC STR FIRE (PROBE) IMPLANT
RETRIEVER NET ROTH 2.5X230 LF (MISCELLANEOUS) IMPLANT
SNARE COLD EXACTO (MISCELLANEOUS) IMPLANT
SNARE SHORT THROW 13M SML OVAL (MISCELLANEOUS) IMPLANT
SNARE SNG USE RND 15MM (INSTRUMENTS) IMPLANT
SPOT EX ENDOSCOPIC TATTOO (MISCELLANEOUS)
TRAP ETRAP POLY (MISCELLANEOUS) IMPLANT
VARIJECT INJECTOR VIN23 (MISCELLANEOUS)
WATER STERILE IRR 250ML POUR (IV SOLUTION) ×2 IMPLANT

## 2021-06-01 NOTE — H&P (Signed)
Lucilla Lame, MD [address] [place], [place] [address] Phone:[phone] Fax : [phone]  Primary Care Physician:  Birdie Sons, MD Primary Gastroenterologist:  Dr. Allen Norris  Pre-Procedure History & Physical: HPI:  Rodney Rojas is a 60 y.o. male is here for an colonoscopy.   Past Medical History:  Diagnosis Date   Arthritis    Complication of anesthesia    slow to wake up after colonoscopy   GERD (gastroesophageal reflux disease)    Headache    History of chicken pox    History of kidney stones    Prostate enlargement    Tremors of nervous system     Past Surgical History:  Procedure Laterality Date   CARDIOVASCULAR STRESS TEST  12/28/2013   Normal; [facility]   CATARACT EXTRACTION W/PHACO Left 10/11/2017   Procedure: CATARACT EXTRACTION PHACO AND INTRAOCULAR LENS PLACEMENT ([place]);  Surgeon: Birder Robson, MD;  Location: [facility] ORS;  Service: Ophthalmology;  Laterality: Left;  [place] 00:23.0 AP% 13.1 CDE 3.02 Fluid Pack Lot # [id number]   CYSTOSCOPY  1994   HERNIA REPAIR  50/04/3817   umbilical hernia repair; Dr. Pat Patrick   KNEE ARTHROPLASTY Right 02/25/2016   Procedure: COMPUTER ASSISTED TOTAL KNEE ARTHROPLASTY;  Surgeon: Dereck Leep, MD;  Location: [facility] ORS;  Service: Orthopedics;  Laterality: Right;   KNEE ARTHROPLASTY Left 06/05/2018   Procedure: COMPUTER ASSISTED TOTAL KNEE ARTHROPLASTY;  Surgeon: Dereck Leep, MD;  Location: [facility] ORS;  Service: Orthopedics;  Laterality: Left;   LOOP RECORDER INSERTION N/A 03/19/2020   Procedure: LOOP RECORDER INSERTION;  Surgeon: Isaias Cowman, MD;  Location: [place] [facility];  Service: Cardiovascular;  Laterality: N/A;   REPLACEMENT TOTAL KNEE Right    VASECTOMY  2000    Prior to Admission medications   Medication Sig Start Date End Date Taking? Authorizing Provider  amLODipine (NORVASC) 5 MG tablet Take 1 tablet (5 mg total) by mouth daily. 12/09/20  Yes Birdie Sons, MD  aspirin 81 MG chewable  tablet Chew by mouth.   Yes [provider]  hydrochlorothiazide (HYDRODIURIL) 12.5 MG tablet TAKE 1 TABLET(12.5 MG) BY MOUTH DAILY KEEP APPOINTMENT 04/23/21  Yes Birdie Sons, MD  meloxicam (MOBIC) 15 MG tablet TAKE 1 TABLET(15 MG) BY MOUTH DAILY 05/06/21  Yes McDonald, Adam R, DPM  pravastatin (PRAVACHOL) 20 MG tablet TAKE 1 TABLET(20 MG) BY MOUTH DAILY 05/28/21  Yes Birdie Sons, MD  primidone (MYSOLINE) 50 MG tablet Take 1-2 tablets (50-100 mg total) by mouth at bedtime. TAKE 1 TO 2 TABLETS(50 TO 100 MG) BY MOUTH AT BEDTIME 10/06/20  Yes Birdie Sons, MD  terbinafine (LAMISIL) 250 MG tablet Take 1 tablet (250 mg total) by mouth daily. 04/07/21  Yes Edrick Kins, DPM  valsartan (DIOVAN) 40 MG tablet TAKE 1 TABLET(40 MG) BY MOUTH DAILY 05/28/21  Yes Birdie Sons, MD  Cholecalciferol 25 MCG (1000 UT) tablet Take by mouth.    [provider]  Coenzyme Q10 100 MG capsule Take by mouth.    [provider]  EPINEPHRINE 0.3 mg/0.3 mL IJ SOAJ injection ADMINISTER 0.3 MG IN THE MUSCLE 1 TIME FOR 1 DOSE 03/23/21   Birdie Sons, MD    Allergies as of 04/27/2021 - Review Complete 04/07/2021  Allergen Reaction Noted   Bee venom Anaphylaxis, Itching, and Other (See Comments) 02/24/2015   Atorvastatin  07/09/2020   Rosuvastatin  07/09/2020    Family History  Problem Relation Age of Onset  Heart Problems Father        had 3V CABG in his 40's   Diabetes Father    Hypertension Father    Cirrhosis Father     Social History   Socioeconomic History   Marital status: Married    Spouse name: Not on file   Number of children: 1   Years of education: 12   Highest education level: Not on file  Occupational History   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  Tobacco Use   Smoking status: Never   Smokeless tobacco: Never  Vaping Use   Vaping Use: Never used  Substance and Sexual Activity   Alcohol use: Yes    Alcohol/week: 1.0 standard drink    Types: 1 Cans of beer per  week    Comment: occasional use   Drug use: No   Sexual activity: Yes  Other Topics Concern   Not on file  Social History Narrative   Not on file   Social Determinants of Health   Financial Resource Strain: Not on file  Food Insecurity: Not on file  Transportation Needs: Not on file  Physical Activity: Not on file  Stress: Not on file  Social Connections: Not on file  Intimate Partner Violence: Not on file    Review of Systems: See HPI, otherwise negative ROS  Physical Exam: BP 133/73   Pulse (!) 59   Temp 97.9 F (36.6 C) (Temporal)   Ht 5\' 10"  (1.778 m)   Wt 95.9 kg   SpO2 98%   BMI 30.35 kg/m  General:   Alert,  pleasant and cooperative in NAD Head:  Normocephalic and atraumatic. Neck:  Supple; no masses or thyromegaly. Lungs:  Clear throughout to auscultation.    Heart:  Regular rate and rhythm. Abdomen:  Soft, nontender and nondistended. Normal bowel sounds, without guarding, and without rebound.   Neurologic:  Alert and  oriented x4;  grossly normal neurologically.  Impression/Plan: Rodney Rojas is here for an colonoscopy to be performed for a history of adenomatous polyps on 2014   Risks, benefits, limitations, and alternatives regarding  colonoscopy have been reviewed with the patient.  Questions have been answered.  All parties agreeable.   Lucilla Lame, MD  06/01/2021, 11:40 AM

## 2021-06-01 NOTE — Anesthesia Preprocedure Evaluation (Signed)
Anesthesia Evaluation  Patient identified by MRN, date of birth, ID band Patient awake    History of Anesthesia Complications Negative for: history of anesthetic complications  Airway Mallampati: II  TM Distance: >3 FB Neck ROM: Full    Dental no notable dental hx.    Pulmonary neg pulmonary ROS,    Pulmonary exam normal        Cardiovascular hypertension, Pt. on medications Normal cardiovascular exam     Neuro/Psych CVA (2 years ago affecting R side and dizziness. fully resolved)    GI/Hepatic negative GI ROS, Neg liver ROS,   Endo/Other  BMI 30  Renal/GU negative Renal ROS     Musculoskeletal   Abdominal   Peds  Hematology negative hematology ROS (+)   Anesthesia Other Findings   Reproductive/Obstetrics                             Anesthesia Physical Anesthesia Plan  ASA: 2  Anesthesia Plan: General   Post-op Pain Management:    Induction: Intravenous  PONV Risk Score and Plan: 2 and Propofol infusion, TIVA and Treatment may vary due to age or medical condition  Airway Management Planned: Natural Airway and Nasal Cannula  Additional Equipment: None  Intra-op Plan:   Post-operative Plan:   Informed Consent: I have reviewed the patients History and Physical, chart, labs and discussed the procedure including the risks, benefits and alternatives for the proposed anesthesia with the patient or authorized representative who has indicated his/her understanding and acceptance.       Plan Discussed with: CRNA  Anesthesia Plan Comments:         Anesthesia Quick Evaluation

## 2021-06-01 NOTE — Transfer of Care (Addendum)
Immediate Anesthesia Transfer of Care Note  Patient: Rodney Rojas  Procedure(s) Performed: COLONOSCOPY WITH PROPOFOL (Rectum) POLYPECTOMY (Rectum)  Patient Location: PACU  Anesthesia Type: General  Level of Consciousness: awake, alert  and patient cooperative  Airway and Oxygen Therapy: Patient Spontanous Breathing and Patient connected to supplemental oxygen  Post-op Assessment: Post-op Vital signs reviewed, Patient's Cardiovascular Status Stable, Respiratory Function Stable, Patent Airway and No signs of Nausea or vomiting  Post-op Vital Signs: Reviewed and stable  Complications: No notable events documented.

## 2021-06-01 NOTE — Op Note (Signed)
[facility] Gastroenterology Patient Name: Limuel Nieblas Procedure Date: 06/01/2021 12:13 PM MRN: [id number] Account #: [id number] Date of Birth: 1961/01/16 Admit Type: Outpatient Age: 60 Room: [facility] OR ROOM 01 Gender: Male Note Status: Finalized Instrument Name: [id number] Procedure:             Colonoscopy Indications:           High risk colon cancer surveillance: Personal history                         of colonic polyps Providers:             Lucilla Lame MD, MD Referring MD:          Kirstie Peri. Caryn Section, MD (Referring MD) Medicines:             Propofol per Anesthesia Complications:         No immediate complications. Procedure:             Pre-Anesthesia Assessment:                        - Prior to the procedure, a History and Physical was                         performed, and patient medications and allergies were                         reviewed. The patient's tolerance of previous                         anesthesia was also reviewed. The risks and benefits                         of the procedure and the sedation options and risks                         were discussed with the patient. All questions were                         answered, and informed consent was obtained. Prior                         Anticoagulants: The patient has taken no previous                         anticoagulant or antiplatelet agents. ASA Grade                         Assessment: II - A patient with mild systemic disease.                         After reviewing the risks and benefits, the patient                         was deemed in satisfactory condition to undergo the                         procedure.  After obtaining informed consent, the colonoscope was                         passed under direct vision. Throughout the procedure,                         the patient's blood pressure, pulse, and oxygen                         saturations were monitored  continuously. The                         Colonoscope was introduced through the anus and                         advanced to the the cecum, identified by appendiceal                         orifice and ileocecal valve. The colonoscopy was                         performed without difficulty. The patient tolerated                         the procedure well. The quality of the bowel                         preparation was excellent. Findings:      The perianal and digital rectal examinations were normal.      A 3 mm polyp was found in the rectum. The polyp was sessile. The polyp       was removed with a cold biopsy forceps. Resection and retrieval were       complete.      Non-bleeding internal hemorrhoids were found during retroflexion. The       hemorrhoids were Grade I (internal hemorrhoids that do not prolapse).      Multiple small-mouthed diverticula were found in the sigmoid colon. Impression:            - One 3 mm polyp in the rectum, removed with a cold                         biopsy forceps. Resected and retrieved.                        - Non-bleeding internal hemorrhoids.                        - Diverticulosis in the sigmoid colon. Recommendation:        - Discharge patient to home.                        - Resume previous diet.                        - Continue present medications.                        - Await pathology results.                        -  If the pathology report reveals adenomatous tissue,                         then repeat the colonoscopy for surveillance in 7                         years. Procedure Code(s):     --- Professional ---                        [phone], Colonoscopy, flexible; with biopsy, single or                         multiple Diagnosis Code(s):     --- Professional ---                        Z86.010, Personal history of colonic polyps                        K62.1, Rectal polyp CPT copyright 2019 American Medical Association. All rights  reserved. The codes documented in this report are preliminary and upon coder review may  be revised to meet current compliance requirements. Lucilla Lame MD, MD 06/01/2021 12:38:15 PM This report has been signed electronically. Number of Addenda: 0 Note Initiated On: 06/01/2021 12:13 PM Scope Withdrawal Time: 0 hours 9 minutes 56 seconds  Total Procedure Duration: 0 hours 13 minutes 40 seconds  Estimated Blood Loss:  Estimated blood loss: none.      [facility]

## 2021-06-01 NOTE — Anesthesia Procedure Notes (Signed)
Date/Time: 06/01/2021 12:20 PM Performed by: Mayme Genta, CRNA Pre-anesthesia Checklist: Patient identified, Emergency Drugs available, Suction available, Timeout performed and Patient being monitored Patient Re-evaluated:Patient Re-evaluated prior to induction Oxygen Delivery Method: Nasal cannula Placement Confirmation: positive ETCO2

## 2021-06-01 NOTE — Anesthesia Postprocedure Evaluation (Signed)
Anesthesia Post Note  Patient: Rodney Rojas  Procedure(s) Performed: COLONOSCOPY WITH PROPOFOL (Rectum) POLYPECTOMY (Rectum)     Patient location during evaluation: PACU Anesthesia Type: General Level of consciousness: awake and alert Pain management: pain level controlled Vital Signs Assessment: post-procedure vital signs reviewed and stable Respiratory status: spontaneous breathing, nonlabored ventilation, respiratory function stable and patient connected to nasal cannula oxygen Cardiovascular status: blood pressure returned to baseline and stable Postop Assessment: no apparent nausea or vomiting Anesthetic complications: no   No notable events documented.  Adele Barthel Yunus [place]

## 2021-06-02 DIAGNOSIS — Z96653 Presence of artificial knee joint, bilateral: Secondary | ICD-10-CM | POA: Diagnosis not present

## 2021-06-02 DIAGNOSIS — M7061 Trochanteric bursitis, right hip: Secondary | ICD-10-CM | POA: Diagnosis not present

## 2021-06-03 ENCOUNTER — Encounter: Payer: Self-pay | Admitting: Gastroenterology

## 2021-06-03 LAB — SURGICAL PATHOLOGY

## 2021-06-04 ENCOUNTER — Encounter: Payer: Self-pay | Admitting: Gastroenterology

## 2021-06-09 ENCOUNTER — Encounter: Payer: Self-pay | Admitting: Family Medicine

## 2021-06-11 MED ORDER — MELOXICAM 15 MG PO TABS: 15.0000 mg | ORAL_TABLET | Freq: Every day | ORAL | 5 refills | Status: DC

## 2021-06-15 DIAGNOSIS — L538 Other specified erythematous conditions: Secondary | ICD-10-CM | POA: Diagnosis not present

## 2021-06-15 DIAGNOSIS — L309 Dermatitis, unspecified: Secondary | ICD-10-CM | POA: Diagnosis not present

## 2021-06-15 DIAGNOSIS — B078 Other viral warts: Secondary | ICD-10-CM | POA: Diagnosis not present

## 2021-07-07 ENCOUNTER — Encounter: Payer: Self-pay | Admitting: Family Medicine

## 2021-07-07 ENCOUNTER — Telehealth (INDEPENDENT_AMBULATORY_CARE_PROVIDER_SITE_OTHER): Payer: BC Managed Care – PPO | Admitting: Family Medicine

## 2021-07-07 DIAGNOSIS — I10 Essential (primary) hypertension: Secondary | ICD-10-CM | POA: Diagnosis not present

## 2021-07-07 DIAGNOSIS — J0110 Acute frontal sinusitis, unspecified: Secondary | ICD-10-CM

## 2021-07-07 DIAGNOSIS — Z8673 Personal history of transient ischemic attack (TIA), and cerebral infarction without residual deficits: Secondary | ICD-10-CM | POA: Diagnosis not present

## 2021-07-07 DIAGNOSIS — R251 Tremor, unspecified: Secondary | ICD-10-CM | POA: Diagnosis not present

## 2021-07-07 MED ORDER — AMOXICILLIN 500 MG PO CAPS: 1000.0000 mg | ORAL_CAPSULE | Freq: Two times a day (BID) | ORAL | 0 refills | Status: AC

## 2021-07-07 NOTE — Progress Notes (Signed)
MyChart Video Visit    Virtual Visit via Video Note   This visit type was conducted due to national recommendations for restrictions regarding the COVID-19 Pandemic (e.g. social distancing) in an effort to limit this patient's exposure and mitigate transmission in our community. This patient is at least at moderate risk for complications without adequate follow up. This format is felt to be most appropriate for this patient at this time. Physical exam was limited by quality of the video and audio technology used for the visit.   Patient location: home Provider location: [facility]  I discussed the limitations of evaluation and management by telemedicine and the availability of in person appointments. The patient expressed understanding and agreed to proceed.  Patient: Rodney Rojas   DOB: Apr 04, 1961   60 y.o. Male  MRN: [id number] Visit Date: 07/07/2021  Today's healthcare provider: Lelon Huh, MD   Chief Complaint  Patient presents with   Cough   Sinus Problem   Subjective    Sinus Problem This is a new problem. Episode onset: 2 weeks ago. The problem is unchanged. There has been no fever. Associated symptoms include congestion (in sinus), coughing (dry hacking cough), ear pain (both ears), sinus pressure and a sore throat (now resolved). Pertinent negatives include no chills, diaphoresis, headaches, hoarse voice, neck pain, shortness of breath or sneezing. Treatments tried: Mucinex D. The treatment provided no relief.      Medications: Outpatient Medications Prior to Visit  Medication Sig   amLODipine (NORVASC) 5 MG tablet Take 1 tablet (5 mg total) by mouth daily.   aspirin 81 MG chewable tablet Chew by mouth.   Cholecalciferol 25 MCG (1000 UT) tablet Take by mouth.   Coenzyme Q10 100 MG capsule Take by mouth.   EPINEPHRINE 0.3 mg/0.3 mL IJ SOAJ injection ADMINISTER 0.3 MG IN THE MUSCLE 1 TIME FOR 1 DOSE   hydrochlorothiazide (HYDRODIURIL) 12.5 MG tablet TAKE 1 TABLET(12.5  MG) BY MOUTH DAILY KEEP APPOINTMENT   meloxicam (MOBIC) 15 MG tablet Take 1 tablet (15 mg total) by mouth daily.   pravastatin (PRAVACHOL) 20 MG tablet TAKE 1 TABLET(20 MG) BY MOUTH DAILY   primidone (MYSOLINE) 50 MG tablet Take 1-2 tablets (50-100 mg total) by mouth at bedtime. TAKE 1 TO 2 TABLETS(50 TO 100 MG) BY MOUTH AT BEDTIME   terbinafine (LAMISIL) 250 MG tablet Take 1 tablet (250 mg total) by mouth daily.   valsartan (DIOVAN) 40 MG tablet TAKE 1 TABLET(40 MG) BY MOUTH DAILY   No facility-administered medications prior to visit.    Review of Systems  Constitutional:  Positive for fatigue. Negative for appetite change, chills, diaphoresis and fever.  HENT:  Positive for congestion (in sinus), ear pain (both ears), postnasal drip, sinus pressure and sore throat (now resolved). Negative for hoarse voice, nosebleeds, rhinorrhea and sneezing.   Respiratory:  Positive for cough (dry hacking cough). Negative for chest tightness, shortness of breath and wheezing.   Cardiovascular:  Negative for chest pain and palpitations.  Gastrointestinal:  Negative for abdominal pain, nausea and vomiting.  Musculoskeletal:  Negative for neck pain.  Neurological:  Negative for headaches.     Objective    There were no vitals taken for this visit.   Physical Exam   Awake, alert, oriented x 3. In no apparent distress   Assessment & Plan     1. Acute frontal sinusitis, recurrence not specified  - amoxicillin (AMOXIL) 500 MG capsule; Take 2 capsules (1,000 mg total) by mouth 2 (  two) times daily for 10 days.  Dispense: 40 capsule; Refill: 0      I discussed the assessment and treatment plan with the patient. The patient was provided an opportunity to ask questions and all were answered. The patient agreed with the plan and demonstrated an understanding of the instructions.   The patient was advised to call back or seek an in-person evaluation if the symptoms worsen or if the condition fails to  improve as anticipated.  I provided 12 minutes of non-face-to-face time during this encounter.  The entirety of the information documented in the History of Present Illness, Review of Systems and Physical Exam were personally obtained by me. Portions of this information were initially documented by the CMA and reviewed by me for thoroughness and accuracy.    Lelon Huh, MD [facility] [phone] (phone) [phone] (fax)  [place]

## 2021-07-08 ENCOUNTER — Telehealth: Payer: BC Managed Care – PPO | Admitting: Family Medicine

## 2021-07-15 ENCOUNTER — Ambulatory Visit (INDEPENDENT_AMBULATORY_CARE_PROVIDER_SITE_OTHER): Payer: BC Managed Care – PPO | Admitting: Family Medicine

## 2021-07-15 ENCOUNTER — Other Ambulatory Visit: Payer: Self-pay

## 2021-07-15 ENCOUNTER — Encounter: Payer: Self-pay | Admitting: Family Medicine

## 2021-07-15 VITALS — BP 132/80 | HR 63 | Temp 98.1°F | Resp 16 | Wt 220.0 lb

## 2021-07-15 DIAGNOSIS — E782 Mixed hyperlipidemia: Secondary | ICD-10-CM | POA: Diagnosis not present

## 2021-07-15 DIAGNOSIS — M791 Myalgia, unspecified site: Secondary | ICD-10-CM | POA: Diagnosis not present

## 2021-07-15 DIAGNOSIS — I10 Essential (primary) hypertension: Secondary | ICD-10-CM

## 2021-07-15 DIAGNOSIS — R051 Acute cough: Secondary | ICD-10-CM

## 2021-07-15 DIAGNOSIS — M2550 Pain in unspecified joint: Secondary | ICD-10-CM | POA: Diagnosis not present

## 2021-07-15 NOTE — Patient Instructions (Signed)
Please review the attached list of medications and notify my office if there are any errors.   The CDC recommends two doses of Shingrix (the shingles vaccine) separated by 2 to 6 months for adults age 60 years and older. I recommend checking with your insurance plan regarding coverage for this vaccine.   

## 2021-07-15 NOTE — Progress Notes (Signed)
Established patient visit   Patient: Rodney Rojas   DOB: 23-Jun-1961   60 y.o. Male  MRN: [id number] Visit Date: 07/15/2021  Today's healthcare provider: Lelon Huh, MD   Chief Complaint  Patient presents with   Hyperlipidemia   Hypertension   Subjective    HPI  Lipid/Cholesterol, Follow-up  Last lipid panel Other pertinent labs  Lab Results  Component Value Date   CHOL 186 05/26/2021   HDL 56 05/26/2021   LDLCALC 106 (H) 05/26/2021   TRIG 137 05/26/2021   CHOLHDL 3.3 05/26/2021   Lab Results  Component Value Date   ALT 21 05/26/2021   AST 15 05/26/2021   PLT 212 03/12/2020   TSH 1.558 08/06/2019     He was last seen for this on 05/26/2021.   Management since that visit includes changing to pravastatin 57m daily.  He reports good compliance with treatment. He is not having side effects.   Symptoms: No chest pain No chest pressure/discomfort  No dyspnea No lower extremity edema  No numbness or tingling of extremity No orthopnea  No palpitations No paroxysmal nocturnal dyspnea  No speech difficulty No syncope   Current diet: well balanced Current exercise: none  The ASCVD Risk score (Arnett DK, et al., 2019) failed to calculate for the following reasons:   The patient has a prior MI or stroke diagnosis  ---------------------------------------------------------------------------------------------------   Hypertension, follow-up  BP Readings from Last 3 Encounters:  07/15/21 132/80  06/01/21 132/81  05/26/21 (!) 134/94   Wt Readings from Last 3 Encounters:  07/15/21 220 lb (99.8 kg)  06/01/21 211 lb 8 oz (95.9 kg)  05/26/21 219 lb (99.3 kg)     He was last seen for hypertension on 05/26/2021.   BP at that visit was 134/94. Management since that visit includes adding valsartan.  He reports good compliance with treatment. He is not having side effects.  He is following a Regular diet. He is not exercising. He does not smoke.  Use of  agents associated with hypertension: NSAIDS.   Outside blood pressures are averaging 138/77. Symptoms: No chest pain No chest pressure  No palpitations No syncope  No dyspnea No orthopnea  No paroxysmal nocturnal dyspnea No lower extremity edema   Pertinent labs: Lab Results  Component Value Date   NA 143 05/26/2021   K 4.5 05/26/2021   CREATININE 0.93 05/26/2021   EGFR 94 05/26/2021   GLUCOSE 101 (H) 05/26/2021   TSH 1.558 08/06/2019     The ASCVD Risk score (Arnett DK, et al., 2019) failed to calculate for the following reasons:   The patient has a prior MI or stroke diagnosis   ---------------------------------------------------------------------------------------------------     Medications: Outpatient Medications Prior to Visit  Medication Sig   amLODipine (NORVASC) 5 MG tablet Take 1 tablet (5 mg total) by mouth daily.   amoxicillin (AMOXIL) 500 MG capsule Take 2 capsules (1,000 mg total) by mouth 2 (two) times daily for 10 days.   aspirin 81 MG chewable tablet Chew by mouth.   Cholecalciferol 25 MCG (1000 UT) tablet Take by mouth.   Coenzyme Q10 100 MG capsule Take by mouth.   EPINEPHRINE 0.3 mg/0.3 mL IJ SOAJ injection ADMINISTER 0.3 MG IN THE MUSCLE 1 TIME FOR 1 DOSE   hydrochlorothiazide (HYDRODIURIL) 12.5 MG tablet TAKE 1 TABLET(12.5 MG) BY MOUTH DAILY KEEP APPOINTMENT   meloxicam (MOBIC) 15 MG tablet Take 1 tablet (15 mg total) by mouth daily.   pravastatin (  PRAVACHOL) 20 MG tablet TAKE 1 TABLET(20 MG) BY MOUTH DAILY   primidone (MYSOLINE) 50 MG tablet Take 1-2 tablets (50-100 mg total) by mouth at bedtime. TAKE 1 TO 2 TABLETS(50 TO 100 MG) BY MOUTH AT BEDTIME   valsartan (DIOVAN) 40 MG tablet TAKE 1 TABLET(40 MG) BY MOUTH DAILY   terbinafine (LAMISIL) 250 MG tablet Take 1 tablet (250 mg total) by mouth daily.   No facility-administered medications prior to visit.    Review of Systems  Constitutional:  Negative for appetite change, chills and fever.  HENT:   Positive for congestion (chest congestion).   Respiratory:  Positive for cough (productive with yellow sputum). Negative for chest tightness, shortness of breath and wheezing.   Cardiovascular:  Negative for chest pain and palpitations.  Gastrointestinal:  Negative for abdominal pain, nausea and vomiting.  Musculoskeletal:  Positive for arthralgias (in shoulders and hips).      Objective    BP 132/80 (BP Location: Right Arm, Patient Position: Sitting, Cuff Size: Large)   Pulse 63   Temp 98.1 F (36.7 C) (Oral)   Resp 16   Wt 220 lb (99.8 kg)   SpO2 98% Comment: room air  BMI 31.57 kg/m  {Show previous vital signs (optional):23777}  Physical Exam   General appearance: Mildly obese male, cooperative and in no acute distress Head: Normocephalic, without obvious abnormality, atraumatic Respiratory: Respirations even and unlabored, normal respiratory rate Extremities: All extremities are intact.  Skin: Skin color, texture, turgor normal. No rashes seen  Psych: Appropriate mood and affect. Neurologic: Mental status: Alert, oriented to person, place, and time, thought content appropriate.     Assessment & Plan     1. Essential hypertension Better with addition of valsartan, check 'lytes today.   2. Mixed hyperlipidemia with history of CVA Off atorvastatin and rosuvastatin due to myalgias. Now on pravastatin but has had onset of aches in hips and shoulders since starting medication. He also continues to take meloxicam which had been helping arthralgias. Check sed rate and ck as below. Consider addition of Tumeric, CoQ10 or vitamin d supplements.   - Comprehensive metabolic panel - Lipid panel  3. Arthralgia, unspecified joint  - Sed Rate (ESR)  4. Myalgia  - CK  5. Acute cough         The entirety of the information documented in the History of Present Illness, Review of Systems and Physical Exam were personally obtained by me. Portions of this information were  initially documented by the CMA and reviewed by me for thoroughness and accuracy.     Lelon Huh, MD  [facility] [phone] (phone) [phone] (fax)  [place]

## 2021-07-16 LAB — SEDIMENTATION RATE: Sed Rate: 8 mm/hr (ref 0–30)

## 2021-07-16 LAB — COMPREHENSIVE METABOLIC PANEL
ALT: 22 IU/L (ref 0–44)
AST: 19 IU/L (ref 0–40)
Albumin/Globulin Ratio: 2 (ref 1.2–2.2)
Albumin: 4.5 g/dL (ref 3.8–4.9)
Alkaline Phosphatase: 58 IU/L (ref 44–121)
BUN/Creatinine Ratio: 16 (ref 10–24)
BUN: 14 mg/dL (ref 8–27)
Bilirubin Total: 0.4 mg/dL (ref 0.0–1.2)
CO2: 27 mmol/L (ref 20–29)
Calcium: 9.2 mg/dL (ref 8.6–10.2)
Chloride: 102 mmol/L (ref 96–106)
Creatinine, Ser: 0.88 mg/dL (ref 0.76–1.27)
Globulin, Total: 2.2 g/dL (ref 1.5–4.5)
Glucose: 96 mg/dL (ref 70–99)
Potassium: 4.3 mmol/L (ref 3.5–5.2)
Sodium: 141 mmol/L (ref 134–144)
Total Protein: 6.7 g/dL (ref 6.0–8.5)
eGFR: 98 mL/min/{1.73_m2} (ref >59)

## 2021-07-16 LAB — LIPID PANEL
Chol/HDL Ratio: 2.7 ratio (ref 0.0–5.0)
Cholesterol, Total: 139 mg/dL (ref 100–199)
HDL: 52 mg/dL (ref >39)
LDL Chol Calc (NIH): 65 mg/dL (ref 0–99)
Triglycerides: 122 mg/dL (ref 0–149)
VLDL Cholesterol Cal: 22 mg/dL (ref 5–40)

## 2021-07-16 LAB — CK: Total CK: 234 U/L (ref 41–331)

## 2021-08-06 ENCOUNTER — Encounter: Payer: Self-pay | Admitting: Family Medicine

## 2021-08-14 ENCOUNTER — Encounter: Payer: Self-pay | Admitting: Family Medicine

## 2021-08-14 DIAGNOSIS — I10 Essential (primary) hypertension: Secondary | ICD-10-CM

## 2021-08-14 IMAGING — MR MR MRA HEAD W/O CM
1 series · 19 of 48 positions shown · non-contrast
Comparison: No pertinent prior exam.

CLINICAL DATA: Pulsatile sensation behind eyes.

EXAM:
MRA HEAD WITHOUT CONTRAST
TECHNIQUE: Angiographic images of the Circle of Willis were acquired using MRA
technique without intravenous contrast.

[Series 5: TOF · axial · 0.5mm · 0.41mm/px · z∈[-96,+20]mm · 19 of 248 slices shown]
[im 1/248]
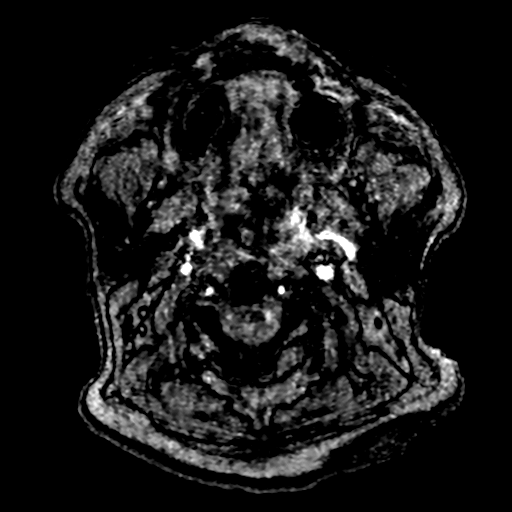
[im 6/248]
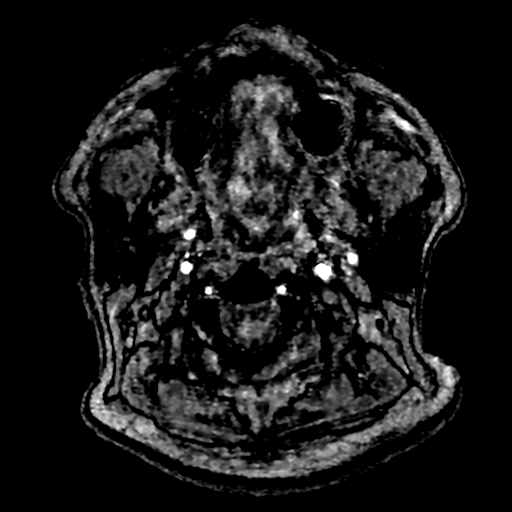
[im 11/248]
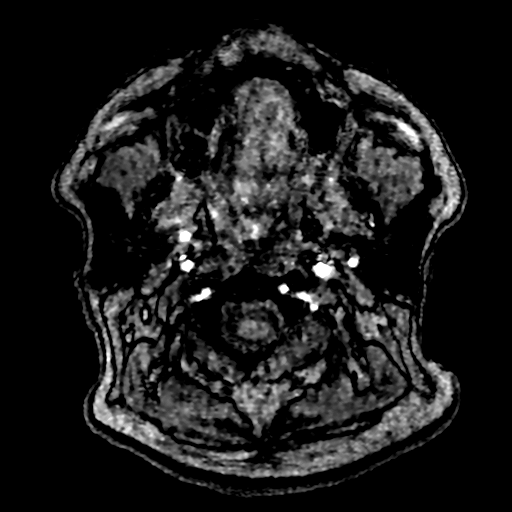
[im 16/248]
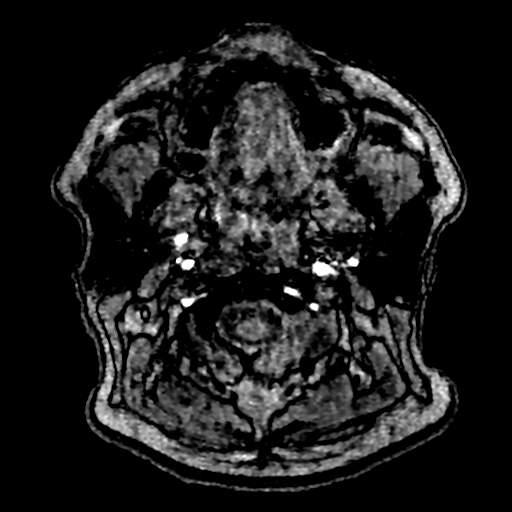
[im 22/248]
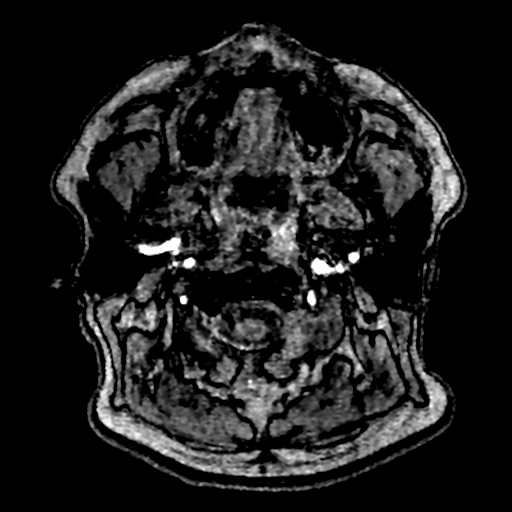
[im 27/248]
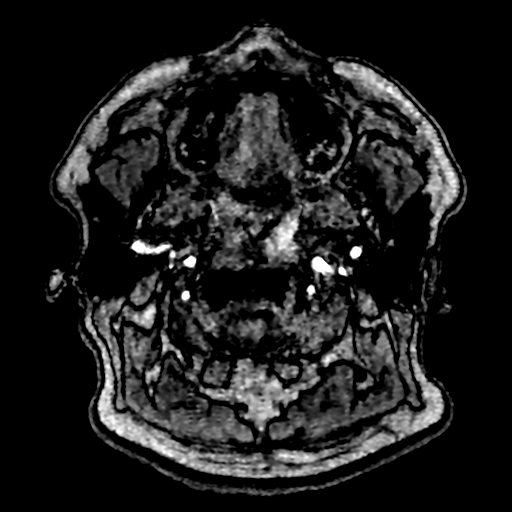
[im 32/248]
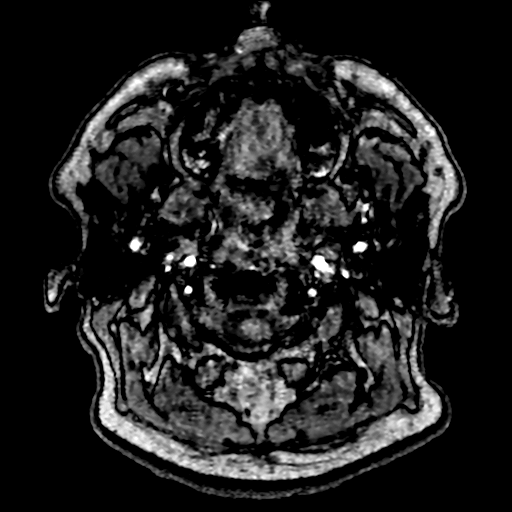
[im 37/248]
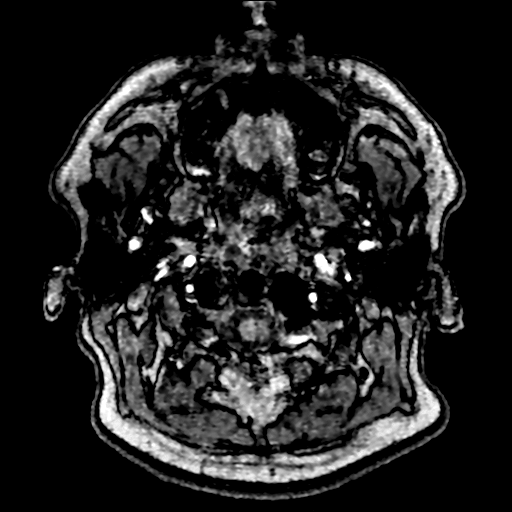
[im 43/248]
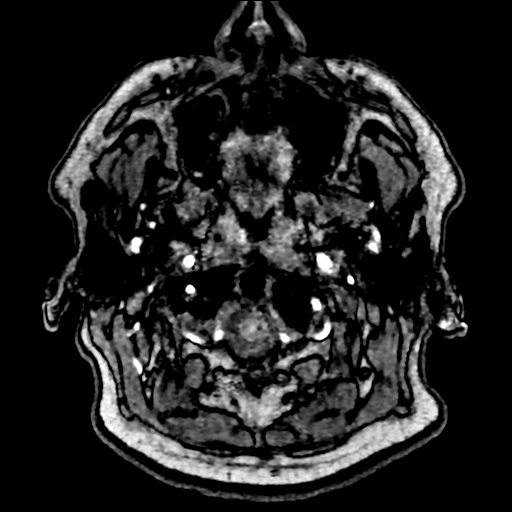
[im 48/248]
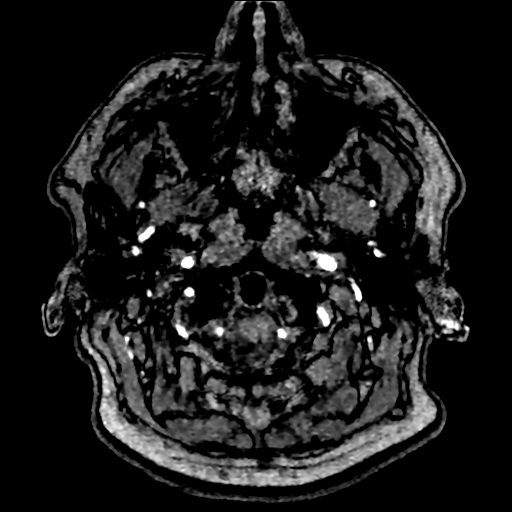
[im 53/248]
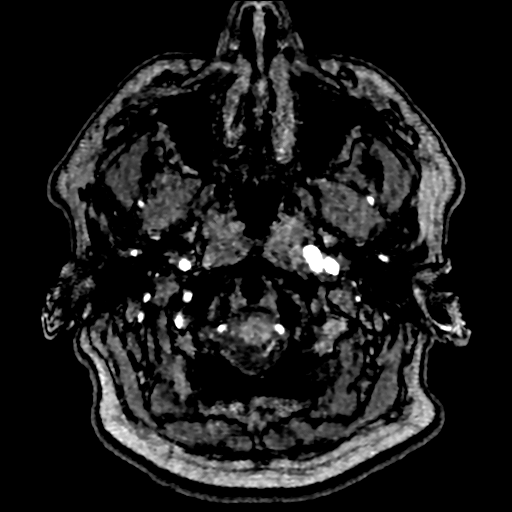
[im 79/248]
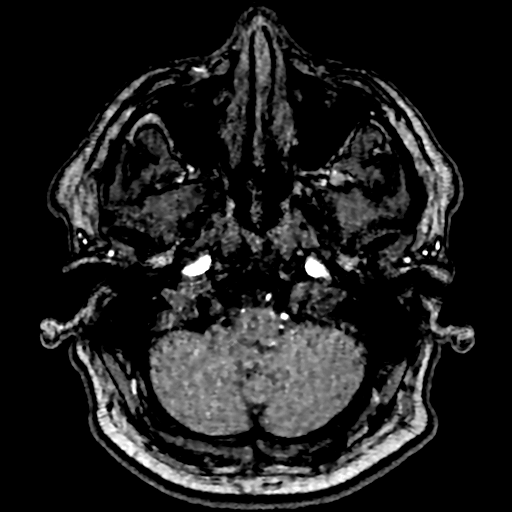
[im 111/248]
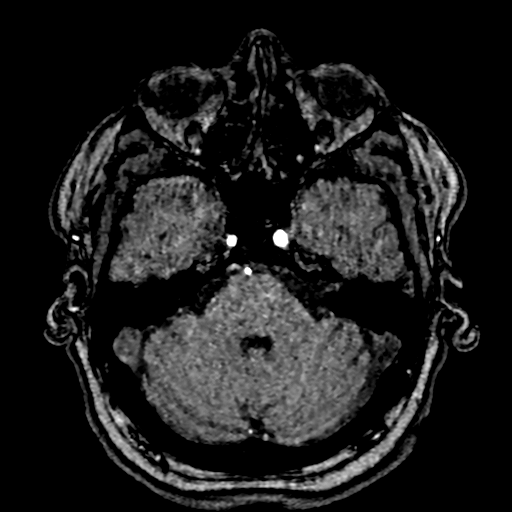
[im 127/248]
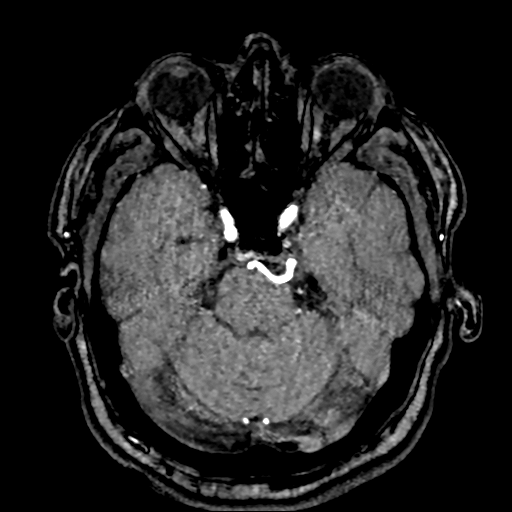
[im 142/248]
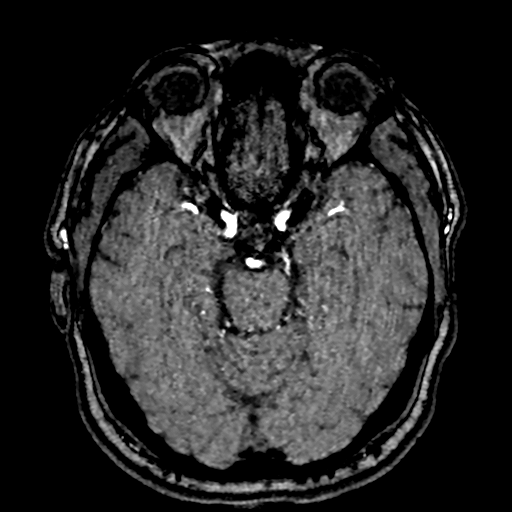
[im 174/248]
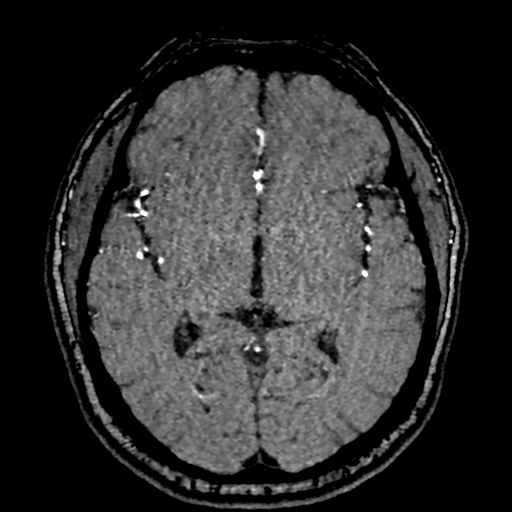
[im 205/248]
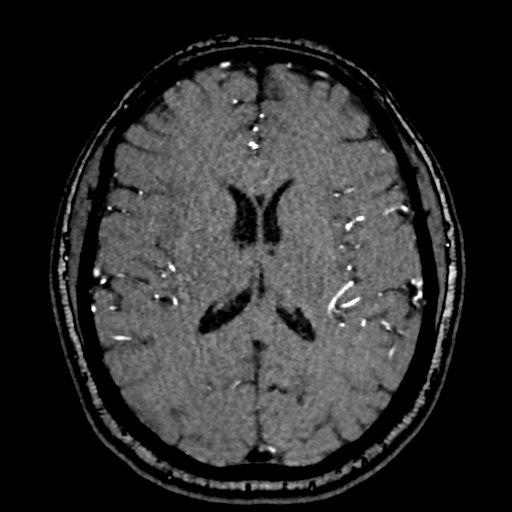
[im 211/248]
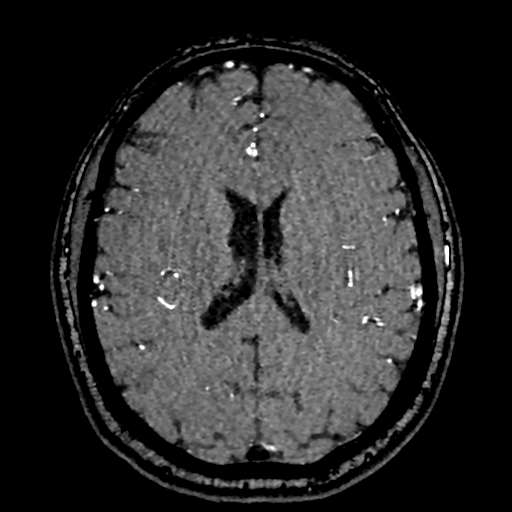
[im 237/248]
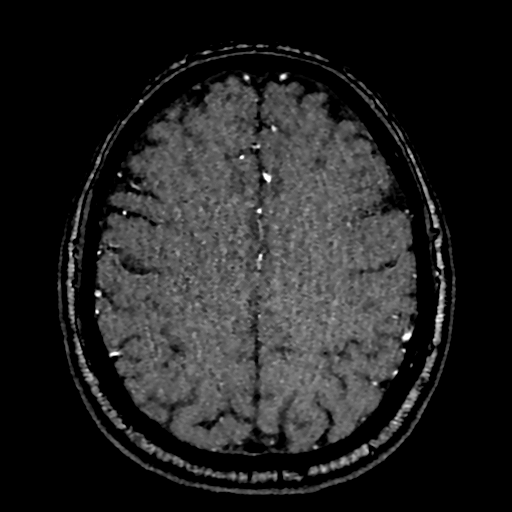

[19 of 48 positions shown; findings below may reference images not displayed]

FINDINGS: Anterior circulation: Tortuous ICAs bilaterally at the skull base.
The left internal carotid artery is larger than right, which is
likely related to the anatomic variant described below. No large
vessel occlusion or proximal hemodynamically significant stenosis.
Mild narrowing of the right A1 ACA. No aneurysm. Small (1-2 mm)
conical outpouching arising from the supraclinoid left ICA is
favored to represent an infundibulum given small vessel arising from
the tip. Mildly dysplastic appearance at the anterior communicating
artery and left MCA bifurcation without discrete aneurysm. No
evidence of arteriovenous malformation.

Posterior circulation: Intradural vertebral arteries, basilar artery
and bilateral posterior cerebral arteries are patent without
proximal hemodynamically significant stenosis. Mild narrowing of the
right intradural vertebral artery. No aneurysm. No evidence of
arteriovenous malformation.

Anatomic variants: A large vessel directly communicates between the
basilar artery and the left internal carotid artery, compatible with
a persistent trigeminal artery (anatomic variant).
IMPRESSION: 1. No large vessel occlusion or proximal hemodynamically significant
stenosis.
2. Persistent left trigeminal artery, anatomic variant.
3. Small (1-2 mm) left posterior communicating artery infundibulum.

## 2021-08-17 ENCOUNTER — Ambulatory Visit
Admission: RE | Admit: 2021-08-17 | Discharge: 2021-08-17 | Disposition: A | Discharge status: Home / Self Care | Payer: BC Managed Care – PPO | Attending: Family Medicine | Admitting: Family Medicine

## 2021-08-17 ENCOUNTER — Ambulatory Visit
Admission: RE | Admit: 2021-08-17 | Discharge: 2021-08-17 | Disposition: A | Discharge status: Home / Self Care | Payer: BC Managed Care – PPO | Source: Ambulatory Visit | Attending: Family Medicine | Admitting: Family Medicine

## 2021-08-17 ENCOUNTER — Encounter: Payer: Self-pay | Admitting: Family Medicine

## 2021-08-17 ENCOUNTER — Other Ambulatory Visit: Payer: Self-pay

## 2021-08-17 ENCOUNTER — Ambulatory Visit (INDEPENDENT_AMBULATORY_CARE_PROVIDER_SITE_OTHER): Payer: BC Managed Care – PPO | Admitting: Family Medicine

## 2021-08-17 VITALS — BP 149/80 | HR 66 | Temp 98.1°F | Resp 14 | Wt 219.0 lb

## 2021-08-17 DIAGNOSIS — M25551 Pain in right hip: Secondary | ICD-10-CM

## 2021-08-17 DIAGNOSIS — T466X5A Adverse effect of antihyperlipidemic and antiarteriosclerotic drugs, initial encounter: Secondary | ICD-10-CM

## 2021-08-17 DIAGNOSIS — G8929 Other chronic pain: Secondary | ICD-10-CM

## 2021-08-17 DIAGNOSIS — Z125 Encounter for screening for malignant neoplasm of prostate: Secondary | ICD-10-CM | POA: Diagnosis not present

## 2021-08-17 DIAGNOSIS — M545 Low back pain, unspecified: Secondary | ICD-10-CM

## 2021-08-17 DIAGNOSIS — M791 Myalgia, unspecified site: Secondary | ICD-10-CM

## 2021-08-17 DIAGNOSIS — M25552 Pain in left hip: Secondary | ICD-10-CM | POA: Diagnosis not present

## 2021-08-17 DIAGNOSIS — E782 Mixed hyperlipidemia: Secondary | ICD-10-CM | POA: Diagnosis not present

## 2021-08-17 DIAGNOSIS — Z8673 Personal history of transient ischemic attack (TIA), and cerebral infarction without residual deficits: Secondary | ICD-10-CM

## 2021-08-17 DIAGNOSIS — M1612 Unilateral primary osteoarthritis, left hip: Secondary | ICD-10-CM | POA: Diagnosis not present

## 2021-08-17 DIAGNOSIS — M5136 Other intervertebral disc degeneration, lumbar region: Secondary | ICD-10-CM | POA: Diagnosis not present

## 2021-08-17 NOTE — Progress Notes (Signed)
Established patient visit   Patient: Rodney Rojas   DOB: 04-29-1961   61 y.o. Male  MRN: [id number] Visit Date: 08/17/2021  Today's healthcare provider: Lelon Huh, MD   Chief Complaint  Patient presents with   Hyperlipidemia   Joint Pain   Subjective    HPI  Muscle and joint pains Was last seen in early December with persistent pains in muscles of upper body and leg, and pains in shoulder and hips. Has stopped pravastatin since then as below. He states the the muscle pains have resolved, but continues to have pain his his shoulders and hips when he lays down at night, interfering with restful sleep. Those pains mostly resolve after he gets up and around in the morning and are not affected by walking or going up stairs. He is still taking meloxicam originally prescribed by podiatry, but does not seem to be helping with shoulder and hip pains. He does volunteer that he had cortisone shots in one of his shoulder at orthopedist some time ago which helped briefly.    Lipid/Cholesterol, Follow-up  Last lipid panel Other pertinent labs  Lab Results  Component Value Date   CHOL 139 07/15/2021   HDL 52 07/15/2021   LDLCALC 65 07/15/2021   TRIG 122 07/15/2021   CHOLHDL 2.7 07/15/2021   Lab Results  Component Value Date   ALT 22 07/15/2021   AST 19 07/15/2021   PLT 212 03/12/2020   TSH 1.558 08/06/2019     He was last seen for this 1 months ago.  Management since that visit includes recommending OTC Coenzyme Q10 200 mg once a day, and OTC vitamin D3 1,000 units once a day for your joint and muscle aches. Also recommend stopping pravastatin for 2 weeks to see if aches improved.  He reports good compliance with treatment. Patient is still having joint pains after trying recommendations.  He is not having side effects.   Symptoms: No chest pain No chest pressure/discomfort  No dyspnea No lower extremity edema  No numbness or tingling of extremity No orthopnea  No  palpitations No paroxysmal nocturnal dyspnea  No speech difficulty No syncope   Current diet: well balanced Current exercise: walking  The ASCVD Risk score (Arnett DK, et al., 2019) failed to calculate for the following reasons:   The patient has a prior MI or stroke diagnosis  ---------------------------------------------------------------------------------------------------   Medications: Outpatient Medications Prior to Visit  Medication Sig   amLODipine (NORVASC) 5 MG tablet Take 1 tablet (5 mg total) by mouth daily.   aspirin 81 MG chewable tablet Chew by mouth.   Cholecalciferol 25 MCG (1000 UT) tablet Take by mouth.   Coenzyme Q10 100 MG capsule Take by mouth.   EPINEPHRINE 0.3 mg/0.3 mL IJ SOAJ injection ADMINISTER 0.3 MG IN THE MUSCLE 1 TIME FOR 1 DOSE   hydrochlorothiazide (HYDRODIURIL) 12.5 MG tablet TAKE 1 TABLET(12.5 MG) BY MOUTH DAILY KEEP APPOINTMENT   meloxicam (MOBIC) 15 MG tablet Take 1 tablet (15 mg total) by mouth daily.   primidone (MYSOLINE) 50 MG tablet Take 1-2 tablets (50-100 mg total) by mouth at bedtime. TAKE 1 TO 2 TABLETS(50 TO 100 MG) BY MOUTH AT BEDTIME   valsartan (DIOVAN) 40 MG tablet TAKE 1 TABLET(40 MG) BY MOUTH DAILY   pravastatin (PRAVACHOL) 20 MG tablet TAKE 1 TABLET(20 MG) BY MOUTH DAILY (Patient not taking: Reported on 08/17/2021)   [DISCONTINUED] terbinafine (LAMISIL) 250 MG tablet Take 1 tablet (250 mg total) by  mouth daily.   No facility-administered medications prior to visit.    Review of Systems  Constitutional:  Negative for appetite change, chills and fever.  Respiratory:  Negative for chest tightness, shortness of breath and wheezing.   Cardiovascular:  Negative for chest pain and palpitations.  Gastrointestinal:  Negative for abdominal pain, nausea and vomiting.  Musculoskeletal:  Positive for arthralgias (shoulder and hip pain).      Objective    BP (!) 149/80 (BP Location: Left Arm, Patient Position: Sitting)    Pulse 66    Temp  98.1 F (36.7 C) (Oral)    Resp 14    Wt 219 lb (99.3 kg)    SpO2 100% Comment: room air   BMI 31.42 kg/m  {Show previous vital signs (optional):23777}  Today's Vitals   08/17/21 0819 08/17/21 0823 08/17/21 0824  BP: (!) 151/84 (!) 165/86 (!) 149/80  Pulse: 66    Resp: 14    Temp: 98.1 F (36.7 C)    TempSrc: Oral    SpO2: 100%    Weight: 219 lb (99.3 kg)     Body mass index is 31.42 kg/m.   Physical Exam   Awake, alert, oriented x 3. In no apparent distress     Assessment & Plan     1. Chronic pain of both hips  - DG Lumbar Spine Complete; Future  2. Chronic bilateral low back pain without sciatica  - DG Hip Unilat W OR W/O Pelvis 2-3 Views Right; Future - DG Hip Unilat W OR W/O Pelvis 2-3 Views Left; Future  As with hip pain, this usually occurs after lying down at night, keeping him,despite taking meloxicam daily. Consider course of prednisone if not other pathology is seen on x-rays.   3. Myalgia due to statin Mostly resolved since stopping pravastatin. Previously tried atorvastatin and rosuvastatin with same side effects.   4. Prostate cancer screening  - PSA Total (Reflex To Free)  5. Mixed hyperlipidemia Intolerant to multiple statins.  - Lipid panel  Consider non-statin medication after reviewing labs.   6. History of CVA in adulthood Now on clopidogrel and aspirin. Was likely related to anatomy of cerebral arteries rather than atherosclerosis.         The entirety of the information documented in the History of Present Illness, Review of Systems and Physical Exam were personally obtained by me. Portions of this information were initially documented by the CMA and reviewed by me for thoroughness and accuracy.     Lelon Huh, MD  [facility] [phone] (phone) [phone] (fax)  [place]

## 2021-08-17 NOTE — Patient Instructions (Signed)
Please review the attached list of medications and notify my office if there are any errors.   Go to the [facility] on [facility] for back and hip Xray s

## 2021-08-18 ENCOUNTER — Other Ambulatory Visit: Payer: Self-pay | Admitting: Family Medicine

## 2021-08-18 DIAGNOSIS — M5136 Other intervertebral disc degeneration, lumbar region: Secondary | ICD-10-CM

## 2021-08-18 DIAGNOSIS — E782 Mixed hyperlipidemia: Secondary | ICD-10-CM

## 2021-08-18 DIAGNOSIS — G8929 Other chronic pain: Secondary | ICD-10-CM | POA: Insufficient documentation

## 2021-08-18 LAB — PSA TOTAL (REFLEX TO FREE): Prostate Specific Ag, Serum: 2.9 ng/mL (ref 0.0–4.0)

## 2021-08-18 LAB — LIPID PANEL
Chol/HDL Ratio: 3.3 ratio (ref 0.0–5.0)
Cholesterol, Total: 163 mg/dL (ref 100–199)
HDL: 50 mg/dL (ref >39)
LDL Chol Calc (NIH): 87 mg/dL (ref 0–99)
Triglycerides: 151 mg/dL — ABNORMAL HIGH (ref 0–149)
VLDL Cholesterol Cal: 26 mg/dL (ref 5–40)

## 2021-08-18 MED ORDER — PREDNISONE 10 MG PO TABS: ORAL_TABLET | ORAL | 0 refills | Status: AC

## 2021-08-18 MED ORDER — EZETIMIBE 10 MG PO TABS: 10.0000 mg | ORAL_TABLET | Freq: Every day | ORAL | 3 refills | Status: DC

## 2021-08-20 MED ORDER — VALSARTAN 80 MG PO TABS: 80.0000 mg | ORAL_TABLET | Freq: Every day | ORAL | 0 refills | Status: DC

## 2021-08-21 ENCOUNTER — Encounter: Payer: BC Managed Care – PPO | Admitting: Family Medicine

## 2021-08-24 ENCOUNTER — Other Ambulatory Visit: Payer: Self-pay | Admitting: Family Medicine

## 2021-08-24 DIAGNOSIS — I10 Essential (primary) hypertension: Secondary | ICD-10-CM

## 2021-09-01 ENCOUNTER — Other Ambulatory Visit: Payer: Self-pay | Admitting: Family Medicine

## 2021-09-01 DIAGNOSIS — I10 Essential (primary) hypertension: Secondary | ICD-10-CM

## 2021-09-01 MED ORDER — HYDROCHLOROTHIAZIDE 12.5 MG PO TABS: ORAL_TABLET | ORAL | 4 refills | Status: DC

## 2021-09-01 MED ORDER — VALSARTAN 80 MG PO TABS: 80.0000 mg | ORAL_TABLET | Freq: Every day | ORAL | 4 refills | Status: DC

## 2021-09-02 DIAGNOSIS — I639 Cerebral infarction, unspecified: Secondary | ICD-10-CM | POA: Diagnosis not present

## 2021-09-02 DIAGNOSIS — E782 Mixed hyperlipidemia: Secondary | ICD-10-CM | POA: Diagnosis not present

## 2021-09-02 DIAGNOSIS — I10 Essential (primary) hypertension: Secondary | ICD-10-CM | POA: Diagnosis not present

## 2021-09-02 DIAGNOSIS — Z8673 Personal history of transient ischemic attack (TIA), and cerebral infarction without residual deficits: Secondary | ICD-10-CM | POA: Diagnosis not present

## 2021-09-14 DIAGNOSIS — M7582 Other shoulder lesions, left shoulder: Secondary | ICD-10-CM | POA: Diagnosis not present

## 2021-09-14 DIAGNOSIS — M25511 Pain in right shoulder: Secondary | ICD-10-CM | POA: Diagnosis not present

## 2021-09-14 DIAGNOSIS — G8929 Other chronic pain: Secondary | ICD-10-CM | POA: Diagnosis not present

## 2021-09-14 DIAGNOSIS — M25512 Pain in left shoulder: Secondary | ICD-10-CM | POA: Diagnosis not present

## 2021-09-14 DIAGNOSIS — M7542 Impingement syndrome of left shoulder: Secondary | ICD-10-CM | POA: Diagnosis not present

## 2021-09-23 DIAGNOSIS — M25512 Pain in left shoulder: Secondary | ICD-10-CM | POA: Diagnosis not present

## 2021-09-23 DIAGNOSIS — G8929 Other chronic pain: Secondary | ICD-10-CM | POA: Diagnosis not present

## 2021-10-13 ENCOUNTER — Ambulatory Visit: Payer: BC Managed Care – PPO | Admitting: Family Medicine

## 2021-11-29 ENCOUNTER — Other Ambulatory Visit: Payer: Self-pay | Admitting: Family Medicine

## 2021-12-09 DIAGNOSIS — H43812 Vitreous degeneration, left eye: Secondary | ICD-10-CM | POA: Diagnosis not present

## 2022-01-11 DIAGNOSIS — D225 Melanocytic nevi of trunk: Secondary | ICD-10-CM | POA: Diagnosis not present

## 2022-01-11 DIAGNOSIS — D2262 Melanocytic nevi of left upper limb, including shoulder: Secondary | ICD-10-CM | POA: Diagnosis not present

## 2022-01-11 DIAGNOSIS — D2261 Melanocytic nevi of right upper limb, including shoulder: Secondary | ICD-10-CM | POA: Diagnosis not present

## 2022-01-11 DIAGNOSIS — L570 Actinic keratosis: Secondary | ICD-10-CM | POA: Diagnosis not present

## 2022-01-11 DIAGNOSIS — D2271 Melanocytic nevi of right lower limb, including hip: Secondary | ICD-10-CM | POA: Diagnosis not present

## 2022-01-11 DIAGNOSIS — X32XXXA Exposure to sunlight, initial encounter: Secondary | ICD-10-CM | POA: Diagnosis not present

## 2022-03-04 ENCOUNTER — Other Ambulatory Visit: Payer: Self-pay | Admitting: Family Medicine

## 2022-03-04 DIAGNOSIS — I10 Essential (primary) hypertension: Secondary | ICD-10-CM

## 2022-03-04 NOTE — Telephone Encounter (Signed)
Called pt, made appt 

## 2022-03-04 NOTE — Telephone Encounter (Signed)
Requested Prescriptions  Pending Prescriptions Disp Refills  . amLODipine (NORVASC) 5 MG tablet [Pharmacy Med Name: AMLODIPINE BESYLATE '5MG'$  TABLETS] 90 tablet 3    Sig: TAKE 1 TABLET(5 MG) BY MOUTH DAILY     Cardiovascular: Calcium Channel Blockers 2 Failed - 03/04/2022  6:35 AM      Failed - Last BP in normal range    BP Readings from Last 1 Encounters:  08/17/21 (!) 149/80         Failed - Valid encounter within last 6 months    Recent Outpatient Visits          6 months ago Chronic pain of both hips   [facility] Birdie Sons, MD   7 months ago Essential hypertension   [facility] Birdie Sons, MD   8 months ago Acute frontal sinusitis, recurrence not specified   [facility] Birdie Sons, MD   9 months ago Essential hypertension   [facility] Birdie Sons, MD   1 year ago Mixed hyperlipidemia   [place], [place]      Future Appointments            In 1 month Fisher, Kirstie Peri, MD [facility]           Passed - Last Heart Rate in normal range    Pulse Readings from Last 1 Encounters:  08/17/21 66

## 2022-03-11 DIAGNOSIS — E782 Mixed hyperlipidemia: Secondary | ICD-10-CM | POA: Diagnosis not present

## 2022-03-11 DIAGNOSIS — I639 Cerebral infarction, unspecified: Secondary | ICD-10-CM | POA: Diagnosis not present

## 2022-03-11 DIAGNOSIS — I10 Essential (primary) hypertension: Secondary | ICD-10-CM | POA: Diagnosis not present

## 2022-03-12 DIAGNOSIS — Z0100 Encounter for examination of eyes and vision without abnormal findings: Secondary | ICD-10-CM | POA: Diagnosis not present

## 2022-03-12 DIAGNOSIS — H43812 Vitreous degeneration, left eye: Secondary | ICD-10-CM | POA: Diagnosis not present

## 2022-03-12 DIAGNOSIS — Z961 Presence of intraocular lens: Secondary | ICD-10-CM | POA: Diagnosis not present

## 2022-03-22 DIAGNOSIS — H2511 Age-related nuclear cataract, right eye: Secondary | ICD-10-CM | POA: Diagnosis not present

## 2022-04-15 ENCOUNTER — Other Ambulatory Visit: Payer: Self-pay

## 2022-04-15 ENCOUNTER — Encounter: Payer: Self-pay | Admitting: Ophthalmology

## 2022-04-15 NOTE — Progress Notes (Unsigned)
Established patient visit  I,April Miller,acting as a scribe for Lelon Huh, MD.,have documented all relevant documentation on the behalf of Lelon Huh, MD,as directed by  Lelon Huh, MD while in the presence of Lelon Huh, MD.   Patient: Rodney Rojas   DOB: Dec 03, 1960   61 y.o. Male  MRN: [id number] Visit Date: 04/16/2022  Today's healthcare provider: Lelon Huh, MD   Chief Complaint  Patient presents with   Follow-up   Hyperlipidemia   Subjective    HPI    Lipid/Cholesterol, Follow-up  Last lipid panel Other pertinent labs  Lab Results  Component Value Date   CHOL 163 08/17/2021   HDL 50 08/17/2021   LDLCALC 87 08/17/2021   TRIG 151 (H) 08/17/2021   CHOLHDL 3.3 08/17/2021   Lab Results  Component Value Date   ALT 22 07/15/2021   AST 19 07/15/2021   PLT 212 03/12/2020   TSH 1.558 08/06/2019     He was last seen for this 7 months ago.  Management since that visit includes starting Zetia '10mg'$  daily.  He reports good compliance with treatment. He is not having side effects.   ---------------------------------------------------------------------------------------------------   Hypertension, follow-up  BP Readings from Last 3 Encounters:  04/16/22 (!) 149/84  08/17/21 (!) 149/80  07/15/21 132/80   Wt Readings from Last 3 Encounters:  04/16/22 223 lb (101.2 kg)  08/17/21 219 lb (99.3 kg)  07/15/21 220 lb (99.8 kg)     He was last seen for hypertension 9 months ago.  BP at that visit was 132/80. Management since that visit includes continuing same medication.  He reports good compliance with treatment. He is not having side effects.  He is following a Regular diet.  Use of agents associated with hypertension: NSAIDS.   Outside blood pressures are usually .    ---------------------------------------------------------------------------------------------------   He also complains of fatigue and wondering if his testosterone  levels are low. He had low testosterone in the past, but had problems with topical treatment. He is interested in testosterone injections. He also states he has been told he snores loudly and does not feel rested when he gets up in the morning.    Medications: Outpatient Medications Prior to Visit  Medication Sig   amLODipine (NORVASC) 5 MG tablet TAKE 1 TABLET(5 MG) BY MOUTH DAILY   aspirin 81 MG chewable tablet Chew by mouth.   Cholecalciferol 25 MCG (1000 UT) tablet Take by mouth.   Coenzyme Q10 100 MG capsule Take by mouth.   EPINEPHRINE 0.3 mg/0.3 mL IJ SOAJ injection ADMINISTER 0.3 MG IN THE MUSCLE 1 TIME FOR 1 DOSE   ezetimibe (ZETIA) 10 MG tablet Take 1 tablet (10 mg total) by mouth daily.   hydrochlorothiazide (HYDRODIURIL) 12.5 MG tablet TAKE 1 TABLET(12.5 MG) BY MOUTH DAILY KEEP APPOINTMENT   meloxicam (MOBIC) 15 MG tablet TAKE 1 TABLET(15 MG) BY MOUTH DAILY   primidone (MYSOLINE) 50 MG tablet Take 1-2 tablets (50-100 mg total) by mouth at bedtime. TAKE 1 TO 2 TABLETS(50 TO 100 MG) BY MOUTH AT BEDTIME   valsartan (DIOVAN) 80 MG tablet Take 1 tablet (80 mg total) by mouth daily.   No facility-administered medications prior to visit.    Review of Systems  Constitutional:  Positive for fatigue. Negative for appetite change, chills and fever.  Respiratory:  Negative for chest tightness, shortness of breath and wheezing.   Cardiovascular:  Negative for chest pain and palpitations.  Gastrointestinal:  Negative for abdominal pain,  nausea and vomiting.        Objective    BP (!) 149/84 (BP Location: Left Arm, Patient Position: Sitting, Cuff Size: Large)   Pulse (!) 58   Resp 16   Wt 223 lb (101.2 kg)   SpO2 98%   BMI 32.00 kg/m   Physical Exam   General: Appearance:    Mildly obese male in no acute distress  Eyes:    PERRL, conjunctiva/corneas clear, EOM's intact       Lungs:     Clear to auscultation bilaterally, respirations unlabored  Heart:    Bradycardic. Normal  rhythm. No murmurs, rubs, or gallops.    MS:   All extremities are intact.    Neurologic:   Awake, alert, oriented x 3. No apparent focal neurological defect.          Assessment & Plan     1. Essential hypertension Doing well current medications although systolic nott at goal today. Suspect he may have OSA which is contributing to Htn.  2. Other fatigue  - CBC - TSH  3. Hypogonadism in male  - Testosterone,Free and Total He did not do well with topical testosterone in the past, but is interested in trying injections if still low.   4. Mixed hyperlipidemia Doing well with ezetimibe.   5. Myalgia due to statin   6. Snores  - Ambulatory referral to Sleep Studies  7. Hypersomnia  - Ambulatory referral to Sleep Studies  8. Need for influenza vaccination  - Flu Vaccine QUAD 53moIM (Fluarix, Fluzone & Alfiuria Quad PF)      The entirety of the information documented in the History of Present Illness, Review of Systems and Physical Exam were personally obtained by me. Portions of this information were initially documented by the CMA and reviewed by me for thoroughness and accuracy.     DLelon Huh MD  [phone](phone) [phone](fax)  [place]

## 2022-04-16 ENCOUNTER — Ambulatory Visit (INDEPENDENT_AMBULATORY_CARE_PROVIDER_SITE_OTHER): Payer: BC Managed Care – PPO | Admitting: Family Medicine

## 2022-04-16 ENCOUNTER — Encounter: Payer: Self-pay | Admitting: Ophthalmology

## 2022-04-16 ENCOUNTER — Encounter: Payer: Self-pay | Admitting: Family Medicine

## 2022-04-16 VITALS — BP 149/84 | HR 58 | Resp 16 | Wt 223.0 lb

## 2022-04-16 DIAGNOSIS — T466X5A Adverse effect of antihyperlipidemic and antiarteriosclerotic drugs, initial encounter: Secondary | ICD-10-CM

## 2022-04-16 DIAGNOSIS — E291 Testicular hypofunction: Secondary | ICD-10-CM

## 2022-04-16 DIAGNOSIS — M791 Myalgia, unspecified site: Secondary | ICD-10-CM

## 2022-04-16 DIAGNOSIS — Z23 Encounter for immunization: Secondary | ICD-10-CM

## 2022-04-16 DIAGNOSIS — R0683 Snoring: Secondary | ICD-10-CM

## 2022-04-16 DIAGNOSIS — G4710 Hypersomnia, unspecified: Secondary | ICD-10-CM

## 2022-04-16 DIAGNOSIS — E782 Mixed hyperlipidemia: Secondary | ICD-10-CM

## 2022-04-16 DIAGNOSIS — R5383 Other fatigue: Secondary | ICD-10-CM

## 2022-04-16 DIAGNOSIS — I10 Essential (primary) hypertension: Secondary | ICD-10-CM | POA: Diagnosis not present

## 2022-04-19 NOTE — Discharge Instructions (Signed)

## 2022-04-20 ENCOUNTER — Other Ambulatory Visit: Payer: Self-pay | Admitting: Family Medicine

## 2022-04-20 ENCOUNTER — Encounter: Payer: Self-pay | Admitting: Family Medicine

## 2022-04-20 DIAGNOSIS — E291 Testicular hypofunction: Secondary | ICD-10-CM

## 2022-04-20 MED ORDER — "SYRINGE/NEEDLE (DISP) 25G X 1"" 1 ML MISC": 1 refills | Status: AC

## 2022-04-20 MED ORDER — TESTOSTERONE CYPIONATE 100 MG/ML IJ SOLN: 1.0000 mL | INTRAMUSCULAR | 1 refills | Status: DC

## 2022-04-21 ENCOUNTER — Ambulatory Visit: Payer: BC Managed Care – PPO | Admitting: Anesthesiology

## 2022-04-21 ENCOUNTER — Ambulatory Visit
Admission: RE | Admit: 2022-04-21 | Discharge: 2022-04-21 | Disposition: A | Discharge status: Home / Self Care | Payer: BC Managed Care – PPO | Attending: Ophthalmology | Admitting: Ophthalmology

## 2022-04-21 ENCOUNTER — Other Ambulatory Visit: Payer: Self-pay

## 2022-04-21 ENCOUNTER — Encounter
Admission: RE | Disposition: A | Discharge status: Home / Self Care | Payer: Self-pay | Source: Home / Self Care | Attending: Ophthalmology

## 2022-04-21 DIAGNOSIS — I10 Essential (primary) hypertension: Secondary | ICD-10-CM | POA: Insufficient documentation

## 2022-04-21 DIAGNOSIS — H2511 Age-related nuclear cataract, right eye: Secondary | ICD-10-CM | POA: Insufficient documentation

## 2022-04-21 DIAGNOSIS — K219 Gastro-esophageal reflux disease without esophagitis: Secondary | ICD-10-CM | POA: Diagnosis not present

## 2022-04-21 DIAGNOSIS — Z87891 Personal history of nicotine dependence: Secondary | ICD-10-CM | POA: Insufficient documentation

## 2022-04-21 DIAGNOSIS — Z79899 Other long term (current) drug therapy: Secondary | ICD-10-CM | POA: Insufficient documentation

## 2022-04-21 HISTORY — PX: CATARACT EXTRACTION W/PHACO: SHX586

## 2022-04-21 HISTORY — DX: Cerebral infarction, unspecified: I63.9

## 2022-04-21 SURGERY — PHACOEMULSIFICATION, CATARACT, WITH IOL INSERTION
Anesthesia: Monitor Anesthesia Care | Site: Eye | Laterality: Right

## 2022-04-21 MED ORDER — SIGHTPATH DOSE#1 BSS IO SOLN
INTRAOCULAR | Status: DC | PRN
  Administered 2022-04-21: 1 mL via INTRAMUSCULAR

## 2022-04-21 MED ORDER — MIDAZOLAM HCL 2 MG/2ML IJ SOLN
INTRAMUSCULAR | Status: DC | PRN
  Administered 2022-04-21: 1 mg via INTRAVENOUS

## 2022-04-21 MED ORDER — SIGHTPATH DOSE#1 BSS IO SOLN
INTRAOCULAR | Status: DC | PRN
  Administered 2022-04-21: 15 mL

## 2022-04-21 MED ORDER — TETRACAINE HCL 0.5 % OP SOLN
1.0000 [drp] | OPHTHALMIC | Status: DC | PRN
  Administered 2022-04-21 (×3): 1 [drp] via OPHTHALMIC

## 2022-04-21 MED ORDER — ARMC OPHTHALMIC DILATING DROPS
1.0000 | OPHTHALMIC | Status: DC | PRN
  Administered 2022-04-21 (×3): 1 via OPHTHALMIC

## 2022-04-21 MED ORDER — BRIMONIDINE TARTRATE-TIMOLOL 0.2-0.5 % OP SOLN
OPHTHALMIC | Status: DC | PRN
  Administered 2022-04-21: 1 [drp] via OPHTHALMIC

## 2022-04-21 MED ORDER — FENTANYL CITRATE (PF) 100 MCG/2ML IJ SOLN
INTRAMUSCULAR | Status: DC | PRN
  Administered 2022-04-21 (×2): 50 ug via INTRAVENOUS

## 2022-04-21 MED ORDER — CEFUROXIME OPHTHALMIC INJECTION 1 MG/0.1 ML
INJECTION | OPHTHALMIC | Status: DC | PRN
  Administered 2022-04-21: 0.1 mL via INTRACAMERAL

## 2022-04-21 MED ORDER — SIGHTPATH DOSE#1 NA HYALUR & NA CHOND-NA HYALUR IO KIT
PACK | INTRAOCULAR | Status: DC | PRN
  Administered 2022-04-21: 1 via OPHTHALMIC

## 2022-04-21 MED ORDER — SIGHTPATH DOSE#1 BSS IO SOLN
INTRAOCULAR | Status: DC | PRN
  Administered 2022-04-21: 51 mL via OPHTHALMIC

## 2022-04-21 MED ORDER — LACTATED RINGERS IV SOLN: INTRAVENOUS | Status: DC

## 2022-04-21 MED ORDER — ONDANSETRON HCL 4 MG/2ML IJ SOLN: 4.0000 mg | Freq: Once | INTRAMUSCULAR | Status: DC | PRN

## 2022-04-21 SURGICAL SUPPLY — 11 items
CATARACT SUITE SIGHTPATH (MISCELLANEOUS) ×1 IMPLANT
FEE CATARACT SUITE SIGHTPATH (MISCELLANEOUS) ×1 IMPLANT
GLOVE SRG 8 PF TXTR STRL LF DI (GLOVE) ×1 IMPLANT
GLOVE SURG ENC TEXT LTX SZ7.5 (GLOVE) ×1 IMPLANT
GLOVE SURG UNDER POLY LF SZ8 (GLOVE) ×1
LENS IOL TECNIS EYHANCE 21.0 (Intraocular Lens) IMPLANT
NDL FILTER BLUNT 18X1 1/2 (NEEDLE) ×1 IMPLANT
NEEDLE FILTER BLUNT 18X1 1/2 (NEEDLE) ×1 IMPLANT
RING MALYGIN 7.0 (MISCELLANEOUS) IMPLANT
SYR 3ML LL SCALE MARK (SYRINGE) ×1 IMPLANT
WATER STERILE IRR 250ML POUR (IV SOLUTION) ×1 IMPLANT

## 2022-04-21 NOTE — Op Note (Signed)
  OPERATIVE NOTE  Rodney Rojas [id number] 04/21/2022   PREOPERATIVE DIAGNOSIS:    Nuclear Sclerotic Cataract Right eye with miotic pupil.        H25.11  POSTOPERATIVE DIAGNOSIS: Nuclear Sclerotic Cataract Right eye with miotic pupil.          PROCEDURE:  Phacoemusification with posterior chamber intraocular lens placement of the right eye which required pupil stretching with the Malyugin pupil expansion device. Ultrasound time: Procedure(s): CATARACT EXTRACTION PHACO AND INTRAOCULAR LENS PLACEMENT (IOC) RIGHT 5.84 00:53.1 (Right)  LENS:   Implant Name Type Inv. Item Serial No. Manufacturer Lot No. LRB No. Used Action  LENS IOL TECNIS EYHANCE 21.0 - [id number] Intraocular Lens LENS IOL TECNIS EYHANCE 21.0 [id number] [employer]  Right 1 Implanted       SURGEON:  Wyonia Hough, MD   ANESTHESIA:  Topical with tetracaine drops and 2% Xylocaine jelly, augmented with 1% preservative-free intracameral lidocaine.   COMPLICATIONS:  None.   DESCRIPTION OF PROCEDURE:  The patient was identified in the holding room and transported to the operating room and placed in the supine position under the operating microscope. Theright eye was identified as the operative eye and it was prepped and draped in the usual sterile ophthalmic fashion.   A 1 millimeter clear-corneal paracentesis was made at the 12:00 position.  0.5 ml of preservative-free 1% lidocaine was injected into the anterior chamber. The anterior chamber was filled with Viscoat viscoelastic.  A 2.4 millimeter keratome was used to make a near-clear corneal incision at the 9:00 position. A Malyugin pupil expander was then placed through the main incision and into the anterior chamber of the eye.  The edge of the iris was secured on the lip of the pupil expander and it was released, thereby expanding the pupil to approximately 7 millimeters for completion of the cataract surgery.  Additional Viscoat was placed in the anterior chamber.   A cystotome and capsulorrhexis forceps were used to make a curvilinear capsulorrhexis.   Balanced salt solution was used to hydrodissect and hydrodelineate the lens nucleus.   Phacoemulsification was used in stop and chop fashion to remove the lens, nucleus and epinucleus.  The remaining cortex was aspirated using the irrigation aspiration handpiece.  Additional Provisc was placed into the eye to distend the capsular bag for lens placement.  A lens was then injected into the capsular bag.  The pupil expanding ring was removed using a Kuglen hook and insertion device. The remaining viscoelastic was aspirated from the capsular bag and the anterior chamber.  The anterior chamber was filled with balanced salt solution to inflate to a physiologic pressure.  Wounds were hydrated with balanced salt solution.  The anterior chamber was inflated to a physiologic pressure with balanced salt solution.  No wound leaks were noted.Cefuroxime 0.1 ml of a '10mg'$ /ml solution was injected into the anterior chamber for a dose of 1 mg of intracameral antibiotic at the completion of the case. Timolol and Brimonidine drops were applied to the eye.  The patient was taken to the recovery room in stable condition without complications of anesthesia or surgery.  [name] 04/21/2022, 1:37 PM

## 2022-04-21 NOTE — Anesthesia Preprocedure Evaluation (Signed)
Anesthesia Evaluation  Patient identified by MRN, date of birth, ID band Patient awake    Reviewed: Allergy & Precautions, NPO status , Patient's Chart, lab work & pertinent test results  History of Anesthesia Complications Negative for: history of anesthetic complications  Airway Mallampati: II  TM Distance: >3 FB Neck ROM: Full    Dental no notable dental hx. (+) Teeth Intact   Pulmonary neg pulmonary ROS, neg sleep apnea, neg COPD, Patient abstained from smoking.Not current smoker,    Pulmonary exam normal breath sounds clear to auscultation       Cardiovascular Exercise Tolerance: Good METShypertension, Pt. on medications (-) CAD and (-) Past MI negative cardio ROS Normal cardiovascular exam(-) dysrhythmias  Rhythm:Regular Rate:Normal - Systolic murmurs    Neuro/Psych  Headaches, CVA (2 years ago affecting R side and dizziness. fully resolved), No Residual Symptoms negative psych ROS   GI/Hepatic negative GI ROS, Neg liver ROS, neg GERD  ,  Endo/Other  neg diabetesBMI 30  Renal/GU negative Renal ROS     Musculoskeletal   Abdominal   Peds  Hematology negative hematology ROS (+)   Anesthesia Other Findings Past Medical History: No date: Arthritis No date: Complication of anesthesia     Comment:  slow to wake up after colonoscopy No date: GERD (gastroesophageal reflux disease) No date: Headache No date: History of chicken pox No date: History of kidney stones No date: Prostate enlargement No date: Stroke ([place]) No date: Tremors of nervous system  Reproductive/Obstetrics                             Anesthesia Physical  Anesthesia Plan  ASA: 2  Anesthesia Plan: MAC   Post-op Pain Management: Minimal or no pain anticipated   Induction: Intravenous  PONV Risk Score and Plan: 1 and Midazolam  Airway Management Planned: Nasal Cannula  Additional Equipment:   Intra-op Plan:    Post-operative Plan:   Informed Consent: I have reviewed the patients History and Physical, chart, labs and discussed the procedure including the risks, benefits and alternatives for the proposed anesthesia with the patient or authorized representative who has indicated his/her understanding and acceptance.       Plan Discussed with: CRNA and Surgeon  Anesthesia Plan Comments: (Explained risks of anesthesia, including PONV, and rare emergencies such as cardiac events, respiratory problems, and allergic reactions, requiring invasive intervention. Discussed the role of CRNA in patient's perioperative care. Patient understands. )        Anesthesia Quick Evaluation

## 2022-04-21 NOTE — H&P (Signed)
[facility]   Primary Care Physician:  Birdie Sons, MD Ophthalmologist: Dr. Leandrew Koyanagi  Pre-Procedure History & Physical: HPI:  Rodney Rojas is a 61 y.o. male here for ophthalmic surgery.   Past Medical History:  Diagnosis Date   Arthritis    Complication of anesthesia    slow to wake up after colonoscopy   GERD (gastroesophageal reflux disease)    Headache    History of chicken pox    History of kidney stones    Prostate enlargement    Stroke [facility])    Tremors of nervous system     Past Surgical History:  Procedure Laterality Date   CARDIOVASCULAR STRESS TEST  12/28/2013   Normal; [facility]   CATARACT EXTRACTION W/PHACO Left 10/11/2017   Procedure: CATARACT EXTRACTION PHACO AND INTRAOCULAR LENS PLACEMENT ([place]);  Surgeon: Birder Robson, MD;  Location: [facility] ORS;  Service: Ophthalmology;  Laterality: Left;  [place] 00:23.0 AP% 13.1 CDE 3.02 Fluid Pack Lot # [id number]   COLONOSCOPY WITH PROPOFOL N/A 06/01/2021   Procedure: COLONOSCOPY WITH PROPOFOL;  Surgeon: Lucilla Lame, MD;  Location: [place];  Service: Endoscopy;  Laterality: N/A;   [place] REPAIR  53/61/4431   umbilical hernia repair; Dr. Pat Patrick   KNEE ARTHROPLASTY Right 02/25/2016   Procedure: COMPUTER ASSISTED TOTAL KNEE ARTHROPLASTY;  Surgeon: Dereck Leep, MD;  Location: [facility] ORS;  Service: Orthopedics;  Laterality: Right;   KNEE ARTHROPLASTY Left 06/05/2018   Procedure: COMPUTER ASSISTED TOTAL KNEE ARTHROPLASTY;  Surgeon: Dereck Leep, MD;  Location: [facility] ORS;  Service: Orthopedics;  Laterality: Left;   LOOP RECORDER INSERTION N/A 03/19/2020   Procedure: LOOP RECORDER INSERTION;  Surgeon: Isaias Cowman, MD;  Location: [place] CV LAB;  Service: Cardiovascular;  Laterality: N/A;   POLYPECTOMY N/A 06/01/2021   Procedure: POLYPECTOMY;  Surgeon: Lucilla Lame, MD;  Location: [place];  Service: Endoscopy;  Laterality: N/A;   REPLACEMENT TOTAL KNEE  Right    VASECTOMY  2000    Prior to Admission medications   Medication Sig Start Date End Date Taking? Authorizing Provider  amLODipine (NORVASC) 5 MG tablet TAKE 1 TABLET(5 MG) BY MOUTH DAILY 03/04/22  Yes Birdie Sons, MD  aspirin 81 MG chewable tablet Chew by mouth.   Yes [provider]  Cholecalciferol 25 MCG (1000 UT) tablet Take by mouth.   Yes [provider]  Coenzyme Q10 100 MG capsule Take by mouth.   Yes [provider]  ezetimibe (ZETIA) 10 MG tablet Take 1 tablet (10 mg total) by mouth daily. 08/18/21  Yes Birdie Sons, MD  hydrochlorothiazide (HYDRODIURIL) 12.5 MG tablet TAKE 1 TABLET(12.5 MG) BY MOUTH DAILY KEEP APPOINTMENT 09/01/21  Yes Birdie Sons, MD  meloxicam (MOBIC) 15 MG tablet TAKE 1 TABLET(15 MG) BY MOUTH DAILY 11/30/21  Yes Birdie Sons, MD  primidone (MYSOLINE) 50 MG tablet Take 1-2 tablets (50-100 mg total) by mouth at bedtime. TAKE 1 TO 2 TABLETS(50 TO 100 MG) BY MOUTH AT BEDTIME 10/06/20  Yes Birdie Sons, MD  valsartan (DIOVAN) 80 MG tablet Take 1 tablet (80 mg total) by mouth daily. 09/01/21  Yes Birdie Sons, MD  EPINEPHRINE 0.3 mg/0.3 mL IJ SOAJ injection ADMINISTER 0.3 MG IN THE MUSCLE 1 TIME FOR 1 DOSE 03/23/21   Birdie Sons, MD  Syringe/Needle, Disp, 25G X 1" 1 ML MISC Use to inject 1 ml testosterone every 2 weeks 04/20/22   Birdie Sons,  MD  Testosterone Cypionate 100 MG/ML SOLN Inject 1 mL as directed every 14 (fourteen) days. 04/20/22   Birdie Sons, MD    Allergies as of 03/15/2022 - Review Complete 08/17/2021  Allergen Reaction Noted   Bee venom Anaphylaxis, Itching, and Other (See Comments) 02/24/2015   Atorvastatin  07/09/2020   Rosuvastatin  07/09/2020    Family History  Problem Relation Age of Onset   Heart Problems Father        had 3V CABG in his 2's   Diabetes Father    Hypertension Father    Cirrhosis Father     Social History   Socioeconomic History   Marital status:  Married    Spouse name: Not on file   Number of children: 1   Years of education: 12   Highest education level: Not on file  Occupational History   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  Tobacco Use   Smoking status: Never   Smokeless tobacco: Never  Vaping Use   Vaping Use: Never used  Substance and Sexual Activity   Alcohol use: Not Currently    Comment: occasional use   Drug use: No   Sexual activity: Yes  Other Topics Concern   Not on file  Social History Narrative   Not on file   Social Determinants of Health   Financial Resource Strain: Not on file  Food Insecurity: Not on file  Transportation Needs: Not on file  Physical Activity: Not on file  Stress: Not on file  Social Connections: Not on file  Intimate Partner Violence: Not on file    Review of Systems: See HPI, otherwise negative ROS  Physical Exam: BP (!) 146/80   Pulse 64   Temp 98.1 F (36.7 C) (Temporal)   Ht '5\' 10"'$  (1.778 m)   Wt 98.9 kg   SpO2 98%   BMI 31.28 kg/m  General:   Alert,  pleasant and cooperative in NAD Head:  Normocephalic and atraumatic. Lungs:  Clear to auscultation.    Heart:  Regular rate and rhythm.   Impression/Plan: Rodney Rojas is here for ophthalmic surgery.  Risks, benefits, limitations, and alternatives regarding ophthalmic surgery have been reviewed with the patient.  Questions have been answered.  All parties agreeable.   Leandrew Koyanagi, MD  04/21/2022, 12:51 PM

## 2022-04-21 NOTE — Anesthesia Postprocedure Evaluation (Signed)
Anesthesia Post Note  Patient: Rodney Rojas  Procedure(s) Performed: CATARACT EXTRACTION PHACO AND INTRAOCULAR LENS PLACEMENT ([facility]) RIGHT 5.84 00:53.1 (Right: Eye)     Patient location during evaluation: PACU Anesthesia Type: MAC Level of consciousness: awake and alert Pain management: pain level controlled Vital Signs Assessment: post-procedure vital signs reviewed and stable Respiratory status: spontaneous breathing, nonlabored ventilation, respiratory function stable and patient connected to nasal cannula oxygen Cardiovascular status: stable and blood pressure returned to baseline Postop Assessment: no apparent nausea or vomiting Anesthetic complications: no   There were no known notable events for this encounter.  Arita Miss

## 2022-04-21 NOTE — Transfer of Care (Signed)
Immediate Anesthesia Transfer of Care Note  Patient: Rodney Rojas  Procedure(s) Performed: CATARACT EXTRACTION PHACO AND INTRAOCULAR LENS PLACEMENT ([facility]) RIGHT 5.84 00:53.1 (Right: Eye)  Patient Location: PACU  Anesthesia Type: MAC  Level of Consciousness: awake, alert  and patient cooperative  Airway and Oxygen Therapy: Patient Spontanous Breathing and Patient connected to supplemental oxygen  Post-op Assessment: Post-op Vital signs reviewed, Patient's Cardiovascular Status Stable, Respiratory Function Stable, Patent Airway and No signs of Nausea or vomiting  Post-op Vital Signs: Reviewed and stable  Complications: There were no known notable events for this encounter.

## 2022-04-22 ENCOUNTER — Encounter: Payer: Self-pay | Admitting: Ophthalmology

## 2022-04-23 LAB — CBC
Hematocrit: 38.3 % (ref 37.5–51.0)
Hemoglobin: 12.7 g/dL — ABNORMAL LOW (ref 13.0–17.7)
MCH: 30.2 pg (ref 26.6–33.0)
MCHC: 33.2 g/dL (ref 31.5–35.7)
MCV: 91 fL (ref 79–97)
Platelets: 193 10*3/uL (ref 150–450)
RBC: 4.2 x10E6/uL (ref 4.14–5.80)
RDW: 12.6 % (ref 11.6–15.4)
WBC: 7.4 10*3/uL (ref 3.4–10.8)

## 2022-04-23 LAB — TESTOSTERONE,FREE AND TOTAL
Testosterone, Free: 5.9 pg/mL — ABNORMAL LOW (ref 6.6–18.1)
Testosterone: 222 ng/dL — ABNORMAL LOW (ref 264–916)

## 2022-04-23 LAB — TSH: TSH: 2.05 u[IU]/mL (ref 0.450–4.500)

## 2022-05-11 ENCOUNTER — Ambulatory Visit: Payer: BC Managed Care – PPO | Admitting: Family Medicine

## 2022-05-28 ENCOUNTER — Other Ambulatory Visit: Payer: Self-pay | Admitting: Family Medicine

## 2022-05-28 ENCOUNTER — Other Ambulatory Visit: Payer: Self-pay | Admitting: Podiatry

## 2022-05-28 DIAGNOSIS — I10 Essential (primary) hypertension: Secondary | ICD-10-CM

## 2022-05-30 ENCOUNTER — Other Ambulatory Visit: Payer: Self-pay | Admitting: Family Medicine

## 2022-06-08 DIAGNOSIS — Z96653 Presence of artificial knee joint, bilateral: Secondary | ICD-10-CM | POA: Diagnosis not present

## 2022-06-28 ENCOUNTER — Ambulatory Visit: Admit: 2022-06-28 | Payer: BC Managed Care – PPO | Admitting: Ophthalmology

## 2022-06-28 SURGERY — PHACOEMULSIFICATION, CATARACT, WITH IOL INSERTION
Anesthesia: Topical | Laterality: Right

## 2022-07-01 ENCOUNTER — Other Ambulatory Visit: Payer: Self-pay | Admitting: Family Medicine

## 2022-07-01 DIAGNOSIS — E782 Mixed hyperlipidemia: Secondary | ICD-10-CM

## 2022-07-07 DIAGNOSIS — R42 Dizziness and giddiness: Secondary | ICD-10-CM | POA: Diagnosis not present

## 2022-07-07 DIAGNOSIS — Z8673 Personal history of transient ischemic attack (TIA), and cerebral infarction without residual deficits: Secondary | ICD-10-CM | POA: Diagnosis not present

## 2022-07-07 DIAGNOSIS — I10 Essential (primary) hypertension: Secondary | ICD-10-CM | POA: Diagnosis not present

## 2022-07-07 DIAGNOSIS — R251 Tremor, unspecified: Secondary | ICD-10-CM | POA: Diagnosis not present

## 2022-07-19 DIAGNOSIS — R42 Dizziness and giddiness: Secondary | ICD-10-CM | POA: Diagnosis not present

## 2022-07-19 DIAGNOSIS — Z8673 Personal history of transient ischemic attack (TIA), and cerebral infarction without residual deficits: Secondary | ICD-10-CM | POA: Diagnosis not present

## 2022-07-29 ENCOUNTER — Telehealth: Payer: BC Managed Care – PPO | Admitting: Emergency Medicine

## 2022-07-29 DIAGNOSIS — J069 Acute upper respiratory infection, unspecified: Secondary | ICD-10-CM

## 2022-07-29 MED ORDER — BENZONATATE 100 MG PO CAPS: 100.0000 mg | ORAL_CAPSULE | Freq: Two times a day (BID) | ORAL | 0 refills | Status: DC | PRN

## 2022-07-29 MED ORDER — AZELASTINE HCL 0.1 % NA SOLN: 2.0000 | Freq: Two times a day (BID) | NASAL | 0 refills | Status: DC

## 2022-07-29 NOTE — Progress Notes (Signed)
E-Visit for Upper Respiratory Infection   We are sorry you are not feeling well.  Here is how we plan to help!  Based on what you have shared with me, it looks like you may have a viral upper respiratory infection.  Upper respiratory infections are caused by a large number of viruses; however, rhinovirus is the most common cause.   Symptoms vary from person to person, with common symptoms including sore throat, cough, fatigue or lack of energy and feeling of general discomfort.  A low-grade fever of up to 100.4 may present, but is often uncommon.  Symptoms vary however, and are closely related to a person's age or underlying illnesses.  The most common symptoms associated with an upper respiratory infection are nasal discharge or congestion, cough, sneezing, headache and pressure in the ears and face.  These symptoms usually persist for about 3 to 10 days, but can last up to 2 weeks.  It is important to know that upper respiratory infections do not cause serious illness or complications in most cases.    Antibiotics are not recommended by the Infectious Disease Society of [place] unless you have severe symptoms (including high fever) or you have symptoms for more than 10 days. If you still have symptoms after 10 days, antibiotics should be considered.   Upper respiratory infections can be transmitted from person to person, with the most common method of transmission being a person's hands.  The virus is able to live on the skin and can infect other persons for up to 2 hours after direct contact.  Also, these can be transmitted when someone coughs or sneezes; thus, it is important to cover the mouth to reduce this risk.  To keep the spread of the illness at [place], good hand hygiene is very important.  This is an infection that is most likely caused by a virus. There are no specific treatments other than to help you with the symptoms until the infection runs its course.  We are sorry you are not feeling  well.  Here is how we plan to help!   For nasal congestion, you may use an oral decongestants such as Mucinex D or if you have glaucoma or high blood pressure use plain Mucinex. I recommend you use plain Mucinex.   Saline nasal spray or nasal drops can help and can safely be used as often as needed for congestion.  Try using saline irrigation, such as with a neti pot, several times a day while you are sick. Many neti pots come with salt packets premeasured to use to make saline. If you use your own salt, make sure it is kosher salt or sea salt (don't use table salt as it has iodine in it and you don't need that in your nose). Use distilled water to make saline. If you mix your own saline using your own salt, the recipe is 1/4 teaspoon salt in 1 cup warm water. Using saline irrigation can help prevent and treat sinus infections.   For your congestion, I have prescribed Azelastine nasal spray two sprays in each nostril twice a day  If you do not have a history of heart disease, hypertension, diabetes or thyroid disease, prostate/bladder issues or glaucoma, you may also use Sudafed to treat nasal congestion.  It is highly recommended that you consult with a pharmacist or your primary care physician to ensure this medication is safe for you to take.     If you have a cough, you may use  cough suppressants such as Delsym and Robitussin.  If you have glaucoma or high blood pressure, you can also use Coricidin HBP.   For cough I have prescribed for you A prescription cough medication called Tessalon Perles 100 mg. You may take 1-2 capsules every 8 hours as needed for cough  If you have a sore or scratchy throat, use a saltwater gargle-  to  teaspoon of salt dissolved in a 4-ounce to 8-ounce glass of warm water.  Gargle the solution for approximately 15-30 seconds and then spit.  It is important not to swallow the solution.  You can also use throat lozenges/cough drops and Chloraseptic spray to help with  throat pain or discomfort.  Warm or cold liquids can also be helpful in relieving throat pain.  For headache, pain or general discomfort, you can use Ibuprofen or Tylenol as directed.   Some authorities believe that zinc sprays or the use of Echinacea may shorten the course of your symptoms.   HOME CARE Only take medications as instructed by your medical team. Be sure to drink plenty of fluids. Water is fine as well as fruit juices, sodas and electrolyte beverages. You may want to stay away from caffeine or alcohol. If you are nauseated, try taking small sips of liquids. How do you know if you are getting enough fluid? Your urine should be a pale yellow or almost colorless. Get rest. Taking a steamy shower or using a humidifier may help nasal congestion and ease sore throat pain. You can place a towel over your head and breathe in the steam from hot water coming from a faucet. Using a saline nasal spray works much the same way. Cough drops, hard candies and sore throat lozenges may ease your cough. Avoid close contacts especially the very young and the elderly Cover your mouth if you cough or sneeze Always remember to wash your hands.   GET HELP RIGHT AWAY IF: You develop worsening fever. If your symptoms do not improve within 10 days You develop yellow or green discharge from your nose over 3 days. You have coughing fits You develop a severe head ache or visual changes. You develop shortness of breath, difficulty breathing or start having chest pain Your symptoms persist after you have completed your treatment plan  MAKE SURE YOU  Understand these instructions. Will watch your condition. Will get help right away if you are not doing well or get worse.  Thank you for choosing an e-visit.  Your e-visit answers were reviewed by a board certified advanced clinical practitioner to complete your personal care plan. Depending upon the condition, your plan could have included both over the  counter or prescription medications.  Please review your pharmacy choice. Make sure the pharmacy is open so you can pick up prescription now. If there is a problem, you may contact your provider through [employer] and have the prescription routed to another pharmacy.  Your safety is important to [place]. If you have drug allergies check your prescription carefully.   For the next 24 hours you can use MyChart to ask questions about today's visit, request a non-urgent call back, or ask for a work or school excuse. You will get an email in the next two days asking about your experience. I hope that your e-visit has been valuable and will speed your recovery.  I have spent 5 minutes in review of e-visit questionnaire, review and updating patient chart, medical decision making and response to patient.   Levada Dy  Jonah Blue, PhD, FNP-BC

## 2022-09-22 ENCOUNTER — Other Ambulatory Visit: Payer: Self-pay | Admitting: Family Medicine

## 2022-09-22 NOTE — Telephone Encounter (Signed)
Requested medication (s) are due for refill today: yes  Requested medication (s) are on the active medication list: yes  Last refill:  05/31/22  Future visit scheduled: yes  Notes to clinic:  Unable to refill per protocol due to failed labs, no updated results.      Requested Prescriptions  Pending Prescriptions Disp Refills   meloxicam (MOBIC) 15 MG tablet [Pharmacy Med Name: MELOXICAM 15MG TABLETS] 30 tablet 3    Sig: TAKE 1 TABLET(15 MG) BY MOUTH DAILY     Analgesics:  COX2 Inhibitors Failed - 09/22/2022  6:46 AM      Failed - Manual Review: Labs are only required if the patient has taken medication for more than 8 weeks.      Failed - HGB in normal range and within 360 days    Hemoglobin  Date Value Ref Range Status  04/16/2022 12.7 (L) 13.0 - 17.7 g/dL Final         Failed - Cr in normal range and within 360 days    Creatinine  Date Value Ref Range Status  12/04/2013 0.86 0.60 - 1.30 mg/dL Final   Creatinine, Ser  Date Value Ref Range Status  07/15/2021 0.88 0.76 - 1.27 mg/dL Final         Failed - AST in normal range and within 360 days    AST  Date Value Ref Range Status  07/15/2021 19 0 - 40 IU/L Final   SGOT(AST)  Date Value Ref Range Status  12/04/2013 17 15 - 37 Unit/L Final         Failed - ALT in normal range and within 360 days    ALT  Date Value Ref Range Status  07/15/2021 22 0 - 44 IU/L Final   SGPT (ALT)  Date Value Ref Range Status  12/04/2013 28 12 - 78 U/L Final         Failed - eGFR is 30 or above and within 360 days    EGFR (African American)  Date Value Ref Range Status  12/04/2013 >60  Final   GFR calc Af Amer  Date Value Ref Range Status  08/27/2020 109 >59 mL/min/1.73 Final    Comment:    **In accordance with recommendations from the NKF-ASN Task force,**   [facility] is in the process of updating its eGFR calculation to the   2021 CKD-EPI creatinine equation that estimates kidney function   without a race variable.     EGFR (Non-African Amer.)  Date Value Ref Range Status  12/04/2013 >60  Final    Comment:    eGFR values <99m/min/1.73 m2 may be an indication of chronic kidney disease (CKD). Calculated eGFR is useful in patients with stable renal function. The eGFR calculation will not be reliable in acutely ill patients when serum creatinine is changing rapidly. It is not useful in  patients on dialysis. The eGFR calculation may not be applicable to patients at the low and high extremes of body sizes, pregnant women, and vegetarians.    GFR calc non Af Amer  Date Value Ref Range Status  08/27/2020 94 >59 mL/min/1.73 Final   eGFR  Date Value Ref Range Status  07/15/2021 98 >59 mL/min/1.73 Final         Passed - HCT in normal range and within 360 days    Hematocrit  Date Value Ref Range Status  04/16/2022 38.3 37.5 - 51.0 % Final         Passed - Patient is not pregnant  Passed - Valid encounter within last 12 months    Recent Outpatient Visits           5 months ago Need for influenza vaccination   [facility] Birdie Sons, MD   1 year ago Chronic pain of both hips   [facility] Birdie Sons, MD   1 year ago Essential hypertension   [place] Birdie Sons, MD   1 year ago Acute frontal sinusitis, recurrence not specified   [place], Donald E, MD   1 year ago Essential hypertension   [place], Donald E, MD

## 2022-09-28 ENCOUNTER — Encounter: Payer: Self-pay | Admitting: Family Medicine

## 2022-09-28 NOTE — Progress Notes (Deleted)
MyChart Video Visit    Virtual Visit via Video Note   This format is felt to be most appropriate for this patient at this time. Physical exam was limited by quality of the video and audio technology used for the visit.   Patient location: *** Provider location: ***  I discussed the limitations of evaluation and management by telemedicine and the availability of in person appointments. The patient expressed understanding and agreed to proceed.  Patient: Rodney Rojas   DOB: Aug 20, 1960   62 y.o. Male  MRN: [id number] Visit Date: 09/29/2022  Today's healthcare provider: Lelon Huh, MD   No chief complaint on file.  Subjective    HPI  Upper respiratory symptoms He complains of {uri sx's' brief:15453}.with {systemic_sx:15294}. Onset of symptoms was  4 days ago and {progression:119226}.He {hydration history:15378}.  Past history is significant for {respiratory illness:412}. Patient is {smoker?:15292} ---------------------------------------------------------------------------------------------------    Medications: Outpatient Medications Prior to Visit  Medication Sig  . amLODipine (NORVASC) 5 MG tablet TAKE 1 TABLET(5 MG) BY MOUTH DAILY  . aspirin 81 MG chewable tablet Chew by mouth.  Marland Kitchen azelastine (ASTELIN) 0.1 % nasal spray Place 2 sprays into both nostrils 2 (two) times daily. Use in each nostril as directed  . benzonatate (TESSALON) 100 MG capsule Take 1 capsule (100 mg total) by mouth 2 (two) times daily as needed for cough.  . Cholecalciferol 25 MCG (1000 UT) tablet Take by mouth.  . Coenzyme Q10 100 MG capsule Take by mouth.  . EPINEPHRINE 0.3 mg/0.3 mL IJ SOAJ injection ADMINISTER 0.3 MG IN THE MUSCLE 1 TIME FOR 1 DOSE  . ezetimibe (ZETIA) 10 MG tablet TAKE 1 TABLET(10 MG) BY MOUTH DAILY  . hydrochlorothiazide (HYDRODIURIL) 12.5 MG tablet TAKE 1 TABLET(12.5 MG) BY MOUTH DAILY KEEP APPOINTMENT  . meloxicam (MOBIC) 15 MG tablet TAKE 1 TABLET(15 MG) BY MOUTH DAILY  .  primidone (MYSOLINE) 50 MG tablet Take 1-2 tablets (50-100 mg total) by mouth at bedtime. TAKE 1 TO 2 TABLETS(50 TO 100 MG) BY MOUTH AT BEDTIME  . Syringe/Needle, Disp, 25G X 1" 1 ML MISC Use to inject 1 ml testosterone every 2 weeks  . Testosterone Cypionate 100 MG/ML SOLN Inject 1 mL as directed every 14 (fourteen) days.  . valsartan (DIOVAN) 80 MG tablet Take 1 tablet (80 mg total) by mouth daily.   No facility-administered medications prior to visit.    Review of Systems  Constitutional:  Positive for chills and fever.  HENT:  Positive for congestion.   Eyes:        Swollen eyes  Respiratory:  Positive for cough.   Musculoskeletal:  Positive for myalgias.  Neurological:  Positive for headaches.    {Labs  Heme  Chem  Endocrine  Serology  Results Review (optional):23779}   Objective    There were no vitals taken for this visit.  {Show previous vital signs (optional):23777}   Physical Exam     Assessment & Plan     ***  No follow-ups on file.     I discussed the assessment and treatment plan with the patient. The patient was provided an opportunity to ask questions and all were answered. The patient agreed with the plan and demonstrated an understanding of the instructions.   The patient was advised to call back or seek an in-person evaluation if the symptoms worsen or if the condition fails to improve as anticipated.  I provided *** minutes of non-face-to-face time during this encounter.  {provider attestation***:1}  Lelon Huh, MD [place] [phone] (phone) [phone] (fax)  [place]

## 2022-09-29 ENCOUNTER — Telehealth: Payer: BC Managed Care – PPO | Admitting: Family Medicine

## 2022-10-04 DIAGNOSIS — Z8673 Personal history of transient ischemic attack (TIA), and cerebral infarction without residual deficits: Secondary | ICD-10-CM | POA: Diagnosis not present

## 2022-10-04 DIAGNOSIS — I10 Essential (primary) hypertension: Secondary | ICD-10-CM | POA: Diagnosis not present

## 2022-10-04 DIAGNOSIS — E782 Mixed hyperlipidemia: Secondary | ICD-10-CM | POA: Diagnosis not present

## 2022-10-04 DIAGNOSIS — I639 Cerebral infarction, unspecified: Secondary | ICD-10-CM | POA: Diagnosis not present

## 2022-10-22 ENCOUNTER — Other Ambulatory Visit: Payer: Self-pay | Admitting: Family Medicine

## 2022-10-22 DIAGNOSIS — I10 Essential (primary) hypertension: Secondary | ICD-10-CM

## 2022-10-23 ENCOUNTER — Telehealth: Payer: BC Managed Care – PPO | Admitting: Family Medicine

## 2022-10-23 ENCOUNTER — Encounter: Payer: Self-pay | Admitting: Family Medicine

## 2022-10-23 DIAGNOSIS — J4541 Moderate persistent asthma with (acute) exacerbation: Secondary | ICD-10-CM

## 2022-10-23 MED ORDER — PREDNISONE 20 MG PO TABS: 20.0000 mg | ORAL_TABLET | Freq: Two times a day (BID) | ORAL | 0 refills | Status: AC

## 2022-10-23 MED ORDER — AZITHROMYCIN 250 MG PO TABS: ORAL_TABLET | ORAL | 0 refills | Status: AC

## 2022-10-23 MED ORDER — BENZONATATE 100 MG PO CAPS: 100.0000 mg | ORAL_CAPSULE | Freq: Two times a day (BID) | ORAL | 0 refills | Status: DC | PRN

## 2022-10-23 NOTE — Progress Notes (Signed)
We are sorry that you are not feeling well.  Here is how we plan to help!  Based on your presentation I believe you most likely have A cough due to bacteria.  When patients have a fever and a productive cough with a change in color or increased sputum production, we are concerned about bacterial bronchitis.  If left untreated it can progress to pneumonia.  If your symptoms do not improve with your treatment plan it is important that you contact your provider.   I have prescribed Azithromyin 250 mg: two tablets now and then one tablet daily for 4 additonal days    In addition you may use A prescription cough medication called Tessalon Perles 100mg . You may take 1-2 capsules every 8 hours as needed for your cough.  Prednisone 20 mg po bid for 5 days.   From your responses in the eVisit questionnaire you describe inflammation in the upper respiratory tract which is causing a significant cough.  This is commonly called Bronchitis and has four common causes:   Allergies Viral Infections Acid Reflux Bacterial Infection Allergies, viruses and acid reflux are treated by controlling symptoms or eliminating the cause. An example might be a cough caused by taking certain blood pressure medications. You stop the cough by changing the medication. Another example might be a cough caused by acid reflux. Controlling the reflux helps control the cough.  USE OF BRONCHODILATOR ("RESCUE") INHALERS: There is a risk from using your bronchodilator too frequently.  The risk is that over-reliance on a medication which only relaxes the muscles surrounding the breathing tubes can reduce the effectiveness of medications prescribed to reduce swelling and congestion of the tubes themselves.  Although you feel brief relief from the bronchodilator inhaler, your asthma may actually be worsening with the tubes becoming more swollen and filled with mucus.  This can delay other crucial treatments, such as oral steroid medications. If  you need to use a bronchodilator inhaler daily, several times per day, you should discuss this with your provider.  There are probably better treatments that could be used to keep your asthma under control.     HOME CARE Only take medications as instructed by your medical team. Complete the entire course of an antibiotic. Drink plenty of fluids and get plenty of rest. Avoid close contacts especially the very young and the elderly Cover your mouth if you cough or cough into your sleeve. Always remember to wash your hands A steam or ultrasonic humidifier can help congestion.   GET HELP RIGHT AWAY IF: You develop worsening fever. You become short of breath You cough up blood. Your symptoms persist after you have completed your treatment plan MAKE SURE YOU  Understand these instructions. Will watch your condition. Will get help right away if you are not doing well or get worse.    Thank you for choosing an e-visit.  Your e-visit answers were reviewed by a board certified advanced clinical practitioner to complete your personal care plan. Depending upon the condition, your plan could have included both over the counter or prescription medications.  Please review your pharmacy choice. Make sure the pharmacy is open so you can pick up prescription now. If there is a problem, you may contact your provider through [employer] and have the prescription routed to another pharmacy.  Your safety is important to [place]. If you have drug allergies check your prescription carefully.   For the next 24 hours you can use MyChart to ask questions about  today's visit, request a non-urgent call back, or ask for a work or school excuse. You will get an email in the next two days asking about your experience. I hope that your e-visit has been valuable and will speed your recovery.    have provided 5 minutes of non face to face time during this encounter for chart review and documentation.

## 2022-10-25 ENCOUNTER — Other Ambulatory Visit: Payer: Self-pay

## 2022-10-25 DIAGNOSIS — I10 Essential (primary) hypertension: Secondary | ICD-10-CM

## 2022-10-25 MED ORDER — VALSARTAN 80 MG PO TABS: 80.0000 mg | ORAL_TABLET | Freq: Every day | ORAL | 0 refills | Status: DC

## 2022-10-25 MED ORDER — HYDROCHLOROTHIAZIDE 12.5 MG PO TABS: ORAL_TABLET | ORAL | 0 refills | Status: DC

## 2022-11-05 ENCOUNTER — Telehealth: Payer: BC Managed Care – PPO | Admitting: Physician Assistant

## 2022-11-05 DIAGNOSIS — J019 Acute sinusitis, unspecified: Secondary | ICD-10-CM

## 2022-11-05 DIAGNOSIS — B9689 Other specified bacterial agents as the cause of diseases classified elsewhere: Secondary | ICD-10-CM

## 2022-11-05 MED ORDER — DOXYCYCLINE HYCLATE 100 MG PO TABS: 100.0000 mg | ORAL_TABLET | Freq: Two times a day (BID) | ORAL | 0 refills | Status: DC

## 2022-11-05 NOTE — Progress Notes (Signed)
E-Visit for Sinus Problems  We are sorry that you are not feeling well.  Here is how we plan to help!  Based on what you have shared with me it looks like you have sinusitis.  Sinusitis is inflammation and infection in the sinus cavities of the head.  Based on your presentation I believe you most likely have Acute Bacterial Sinusitis.  This is an infection caused by bacteria and is treated with antibiotics. I have prescribed Doxycycline 100mg by mouth twice a day for 10 days. You may use an oral decongestant such as Mucinex D or if you have glaucoma or high blood pressure use plain Mucinex. Saline nasal spray help and can safely be used as often as needed for congestion.  If you develop worsening sinus pain, fever or notice severe headache and vision changes, or if symptoms are not better after completion of antibiotic, please schedule an appointment with a health care provider.    Sinus infections are not as easily transmitted as other respiratory infection, however we still recommend that you avoid close contact with loved ones, especially the very young and elderly.  Remember to wash your hands thoroughly throughout the day as this is the number one way to prevent the spread of infection!  Home Care: Only take medications as instructed by your medical team. Complete the entire course of an antibiotic. Do not take these medications with alcohol. A steam or ultrasonic humidifier can help congestion.  You can place a towel over your head and breathe in the steam from hot water coming from a faucet. Avoid close contacts especially the very young and the elderly. Cover your mouth when you cough or sneeze. Always remember to wash your hands.  Get Help Right Away If: You develop worsening fever or sinus pain. You develop a severe head ache or visual changes. Your symptoms persist after you have completed your treatment plan.  Make sure you Understand these instructions. Will watch your  condition. Will get help right away if you are not doing well or get worse.  Thank you for choosing an e-visit.  Your e-visit answers were reviewed by a board certified advanced clinical practitioner to complete your personal care plan. Depending upon the condition, your plan could have included both over the counter or prescription medications.  Please review your pharmacy choice. Make sure the pharmacy is open so you can pick up prescription now. If there is a problem, you may contact your provider through MyChart messaging and have the prescription routed to another pharmacy.  Your safety is important to us. If you have drug allergies check your prescription carefully.   For the next 24 hours you can use MyChart to ask questions about today's visit, request a non-urgent call back, or ask for a work or school excuse. You will get an email in the next two days asking about your experience. I hope that your e-visit has been valuable and will speed your recovery.  I have spent 5 minutes in review of e-visit questionnaire, review and updating patient chart, medical decision making and response to patient.   Ryu Cerreta M Lynelle Weiler, PA-C  

## 2022-11-11 NOTE — Progress Notes (Signed)
I,Rodney Rojas,acting as a scribe for Rodney Merry, MD.,have documented all relevant documentation on the behalf of Rodney Merry, MD,as directed by  Rodney Merry, MD while in the presence of Rodney Merry, MD.     Established patient visit   Patient: Rodney Rojas   DOB: Dec 01, 1960   62 y.o. Male  MRN: [id number] Visit Date: 11/12/2022  Today's healthcare provider: [name], MD   Chief Complaint  Patient presents with   Hypertension   Subjective    HPI  Patient is a 62 year old male who presents today for follow up of low testosterone.  He was last seen in September and was prescribed injectable Testosterone.  He is feeling more energetic since then. He does report some long standing issues with urinary hesitancy and states he was prescribed tamsulosin in the past by Dr. Sheppard Penton which was effective. He is also due to follow up on lipids and blood pressure, intolerant to multiple statins but doing well with ezetimibe which he takes consistently every day.   Testosterone  Date Value Ref Range Status  04/16/2022 222 (L) 264 - 916 ng/dL Final  90/24/0973 532 (L) 264 - 916 ng/dL Final  99/24/2683 419 (L) 264 - 916 ng/dL Final   Testosterone, Free  Date Value Ref Range Status  04/16/2022 5.9 (L) 6.6 - 18.1 pg/mL Final  01/22/2019 10.0 7.2 - 24.0 pg/mL Final  01/10/2019 7.7 7.2 - 24.0 pg/mL Final   Lab Results  Component Value Date   CHOL 163 08/17/2021   HDL 50 08/17/2021   LDLCALC 87 08/17/2021   TRIG 151 (H) 08/17/2021   CHOLHDL 3.3 08/17/2021   Last metabolic panel Lab Results  Component Value Date   GLUCOSE 96 07/15/2021   NA 141 07/15/2021   K 4.3 07/15/2021   CL 102 07/15/2021   CO2 27 07/15/2021   BUN 14 07/15/2021   CREATININE 0.88 07/15/2021   EGFR 98 07/15/2021   CALCIUM 9.2 07/15/2021   PROT 6.7 07/15/2021   ALBUMIN 4.5 07/15/2021   LABGLOB 2.2 07/15/2021   AGRATIO 2.0 07/15/2021   BILITOT 0.4 07/15/2021   ALKPHOS 58 07/15/2021   AST 19  07/15/2021   ALT 22 07/15/2021   ANIONGAP 10 08/06/2019  \  Medications: Outpatient Medications Prior to Visit  Medication Sig   amLODipine (NORVASC) 5 MG tablet TAKE 1 TABLET(5 MG) BY MOUTH DAILY   aspirin 81 MG chewable tablet Chew by mouth.   Cholecalciferol 25 MCG (1000 UT) tablet Take by mouth.   Coenzyme Q10 100 MG capsule Take by mouth.   doxycycline (VIBRA-TABS) 100 MG tablet Take 1 tablet (100 mg total) by mouth 2 (two) times daily.   EPINEPHRINE 0.3 mg/0.3 mL IJ SOAJ injection ADMINISTER 0.3 MG IN THE MUSCLE 1 TIME FOR 1 DOSE   ezetimibe (ZETIA) 10 MG tablet TAKE 1 TABLET(10 MG) BY MOUTH DAILY   hydrochlorothiazide (HYDRODIURIL) 12.5 MG tablet TAKE 1 TABLET(12.5 MG) BY MOUTH DAILY KEEP APPOINTMENT   meloxicam (MOBIC) 15 MG tablet TAKE 1 TABLET(15 MG) BY MOUTH DAILY   primidone (MYSOLINE) 50 MG tablet Take 1-2 tablets (50-100 mg total) by mouth at bedtime. TAKE 1 TO 2 TABLETS(50 TO 100 MG) BY MOUTH AT BEDTIME   Syringe/Needle, Disp, 25G X 1" 1 ML MISC Use to inject 1 ml testosterone every 2 weeks   tamsulosin (FLOMAX) 0.4 MG CAPS capsule Take 0.4 mg by mouth as needed.   Testosterone Cypionate 100 MG/ML SOLN Inject 1 mL as directed  every 14 (fourteen) days.   valsartan (DIOVAN) 80 MG tablet Take 1 tablet (80 mg total) by mouth daily.   [DISCONTINUED] azelastine (ASTELIN) 0.1 % nasal spray Place 2 sprays into both nostrils 2 (two) times daily. Use in each nostril as directed   [DISCONTINUED] benzonatate (TESSALON) 100 MG capsule Take 1 capsule (100 mg total) by mouth 2 (two) times daily as needed for cough.   No facility-administered medications prior to visit.    Review of Systems  Constitutional:  Negative for appetite change, chills and fever.  Respiratory:  Negative for chest tightness, shortness of breath and wheezing.   Cardiovascular:  Negative for chest pain and palpitations.  Gastrointestinal:  Negative for abdominal pain, nausea and vomiting.       Objective     BP 135/72 (BP Location: Left Arm, Patient Position: Sitting, Cuff Size: Large)   Pulse 67   Temp 98 F (36.7 C) (Temporal)   Resp 16   Wt 220 lb (99.8 kg)   BMI 31.57 kg/m    Physical Exam   General: Appearance:    Mildly obese male in no acute distress  Eyes:    PERRL, conjunctiva/corneas clear, EOM's intact       Lungs:     Clear to auscultation bilaterally, respirations unlabored  Heart:    Normal heart rate. Normal rhythm. No murmurs, rubs, or gallops.    MS:   All extremities are intact.    Neurologic:   Awake, alert, oriented x 3. No apparent focal neurological defect.         Assessment & Plan     1. Essential hypertension Well controlled.  Continue current medications.    2. Mixed hyperlipidemia  intolerant to multiple statins but doing well with ezetimibe which he takes consistently every day - CBC - Comprehensive metabolic panel - Lipid panel  3. History of CVA in adulthood Asymptomatic. Compliant with medication.  Continue aggressive risk factor modification.    4. Statin myopathy [G72.0, T46.6X5A]   5. Prostate cancer screening  - PSA Total (Reflex To Free) ([facility] only)  6. Hypogonadism, male Feels more energetic since starting testosterone injections last fall - Testosterone,Free and Total  7. Urinary hesitancy Did well with tamsulosin prescribed by Dr. Sheppard Penton in the past.  - tamsulosin (FLOMAX) 0.4 MG CAPS capsule; Take 1 capsule (0.4 mg total) by mouth as needed.  Dispense: 30 capsule; Refill: 3  8. Need for shingles vaccine  - Varicella-zoster vaccine IM #1      The entirety of the information documented in the History of Present Illness, Review of Systems and Physical Exam were personally obtained by me. Portions of this information were initially documented by the CMA and reviewed by me for thoroughness and accuracy.     Rodney Merry, MD  [facility] [phone] (phone) [phone] (fax)  [facility]

## 2022-11-12 ENCOUNTER — Encounter: Payer: Self-pay | Admitting: Family Medicine

## 2022-11-12 ENCOUNTER — Ambulatory Visit: Payer: BC Managed Care – PPO | Admitting: Family Medicine

## 2022-11-12 VITALS — BP 135/72 | HR 67 | Temp 98.0°F | Resp 16 | Wt 220.0 lb

## 2022-11-12 DIAGNOSIS — E291 Testicular hypofunction: Secondary | ICD-10-CM

## 2022-11-12 DIAGNOSIS — Z125 Encounter for screening for malignant neoplasm of prostate: Secondary | ICD-10-CM

## 2022-11-12 DIAGNOSIS — Z23 Encounter for immunization: Secondary | ICD-10-CM | POA: Diagnosis not present

## 2022-11-12 DIAGNOSIS — G720 Drug-induced myopathy: Secondary | ICD-10-CM

## 2022-11-12 DIAGNOSIS — Z8673 Personal history of transient ischemic attack (TIA), and cerebral infarction without residual deficits: Secondary | ICD-10-CM

## 2022-11-12 DIAGNOSIS — E782 Mixed hyperlipidemia: Secondary | ICD-10-CM

## 2022-11-12 DIAGNOSIS — T466X5A Adverse effect of antihyperlipidemic and antiarteriosclerotic drugs, initial encounter: Secondary | ICD-10-CM

## 2022-11-12 DIAGNOSIS — R3911 Hesitancy of micturition: Secondary | ICD-10-CM

## 2022-11-12 DIAGNOSIS — I10 Essential (primary) hypertension: Secondary | ICD-10-CM | POA: Diagnosis not present

## 2022-11-12 MED ORDER — TAMSULOSIN HCL 0.4 MG PO CAPS: 0.4000 mg | ORAL_CAPSULE | ORAL | 3 refills | Status: DC | PRN

## 2022-11-15 DIAGNOSIS — E291 Testicular hypofunction: Secondary | ICD-10-CM | POA: Diagnosis not present

## 2022-11-15 DIAGNOSIS — E782 Mixed hyperlipidemia: Secondary | ICD-10-CM | POA: Diagnosis not present

## 2022-11-15 DIAGNOSIS — Z125 Encounter for screening for malignant neoplasm of prostate: Secondary | ICD-10-CM | POA: Diagnosis not present

## 2022-11-16 DIAGNOSIS — H43813 Vitreous degeneration, bilateral: Secondary | ICD-10-CM | POA: Diagnosis not present

## 2022-11-16 DIAGNOSIS — Z961 Presence of intraocular lens: Secondary | ICD-10-CM | POA: Diagnosis not present

## 2022-11-19 LAB — COMPREHENSIVE METABOLIC PANEL
ALT: 25 IU/L (ref 0–44)
AST: 19 IU/L (ref 0–40)
Albumin/Globulin Ratio: 2.2 (ref 1.2–2.2)
Albumin: 4.4 g/dL (ref 3.9–4.9)
Alkaline Phosphatase: 53 IU/L (ref 44–121)
BUN/Creatinine Ratio: 17 (ref 10–24)
BUN: 16 mg/dL (ref 8–27)
Bilirubin Total: 0.5 mg/dL (ref 0.0–1.2)
CO2: 25 mmol/L (ref 20–29)
Calcium: 9.8 mg/dL (ref 8.6–10.2)
Chloride: 102 mmol/L (ref 96–106)
Creatinine, Ser: 0.96 mg/dL (ref 0.76–1.27)
Globulin, Total: 2 g/dL (ref 1.5–4.5)
Glucose: 99 mg/dL (ref 70–99)
Potassium: 4.3 mmol/L (ref 3.5–5.2)
Sodium: 141 mmol/L (ref 134–144)
Total Protein: 6.4 g/dL (ref 6.0–8.5)
eGFR: 90 mL/min/{1.73_m2} (ref >59)

## 2022-11-19 LAB — LIPID PANEL
Chol/HDL Ratio: 2.8 ratio (ref 0.0–5.0)
Cholesterol, Total: 149 mg/dL (ref 100–199)
HDL: 54 mg/dL (ref >39)
LDL Chol Calc (NIH): 76 mg/dL (ref 0–99)
Triglycerides: 105 mg/dL (ref 0–149)
VLDL Cholesterol Cal: 19 mg/dL (ref 5–40)

## 2022-11-19 LAB — TESTOSTERONE,FREE AND TOTAL
Testosterone, Free: 8.5 pg/mL (ref 6.6–18.1)
Testosterone: 393 ng/dL (ref 264–916)

## 2022-11-19 LAB — CBC
Hematocrit: 41.4 % (ref 37.5–51.0)
Hemoglobin: 13.8 g/dL (ref 13.0–17.7)
MCH: 30.1 pg (ref 26.6–33.0)
MCHC: 33.3 g/dL (ref 31.5–35.7)
MCV: 90 fL (ref 79–97)
Platelets: 212 10*3/uL (ref 150–450)
RBC: 4.58 x10E6/uL (ref 4.14–5.80)
RDW: 13.2 % (ref 11.6–15.4)
WBC: 5.8 10*3/uL (ref 3.4–10.8)

## 2022-11-19 LAB — PSA TOTAL (REFLEX TO FREE): Prostate Specific Ag, Serum: 3.2 ng/mL (ref 0.0–4.0)

## 2022-11-21 ENCOUNTER — Other Ambulatory Visit: Payer: Self-pay | Admitting: Family Medicine

## 2023-01-13 DIAGNOSIS — D225 Melanocytic nevi of trunk: Secondary | ICD-10-CM | POA: Diagnosis not present

## 2023-01-13 DIAGNOSIS — L570 Actinic keratosis: Secondary | ICD-10-CM | POA: Diagnosis not present

## 2023-01-13 DIAGNOSIS — D2271 Melanocytic nevi of right lower limb, including hip: Secondary | ICD-10-CM | POA: Diagnosis not present

## 2023-01-13 DIAGNOSIS — D2261 Melanocytic nevi of right upper limb, including shoulder: Secondary | ICD-10-CM | POA: Diagnosis not present

## 2023-01-13 DIAGNOSIS — D2262 Melanocytic nevi of left upper limb, including shoulder: Secondary | ICD-10-CM | POA: Diagnosis not present

## 2023-01-13 DIAGNOSIS — X32XXXA Exposure to sunlight, initial encounter: Secondary | ICD-10-CM | POA: Diagnosis not present

## 2023-01-20 ENCOUNTER — Other Ambulatory Visit: Payer: Self-pay | Admitting: Family Medicine

## 2023-01-20 DIAGNOSIS — I10 Essential (primary) hypertension: Secondary | ICD-10-CM

## 2023-01-20 NOTE — Telephone Encounter (Signed)
Requested Prescriptions  Pending Prescriptions Disp Refills   valsartan (DIOVAN) 80 MG tablet [Pharmacy Med Name: VALSARTAN 80MG  TABLETS] 90 tablet 0    Sig: TAKE 1 TABLET(80 MG) BY MOUTH DAILY     Cardiovascular:  Angiotensin Receptor Blockers Passed - 01/20/2023  6:34 AM      Passed - Cr in normal range and within 180 days    Creatinine  Date Value Ref Range Status  12/04/2013 0.86 0.60 - 1.30 mg/dL Final   Creatinine, Ser  Date Value Ref Range Status  11/15/2022 0.96 0.76 - 1.27 mg/dL Final         Passed - K in normal range and within 180 days    Potassium  Date Value Ref Range Status  11/15/2022 4.3 3.5 - 5.2 mmol/L Final  12/04/2013 3.6 3.5 - 5.1 mmol/L Final         Passed - Patient is not pregnant      Passed - Last BP in normal range    BP Readings from Last 1 Encounters:  11/12/22 135/72         Passed - Valid encounter within last 6 months    Recent Outpatient Visits           2 months ago Essential hypertension   [place] [facility] Malva Limes, MD   9 months ago Need for influenza vaccination   [facility] Malva Limes, MD   1 year ago Chronic pain of both hips   [facility] L[facility], MD   1 year ago Essential hypertension   [place] [facility] Malva Limes, MD   1 year ago Acute frontal sinusitis, recurrence not specified   [place] [facility] Malva Limes, MD               hydrochlorothiazide (HYDRODIURIL) 12.5 MG tablet [Pharmacy Med Name: HYDROCHLOROTHIAZIDE 12.5MG  TABLETS] 90 tablet 0    Sig: TAKE 1 TABLET(12.5 MG) BY MOUTH DAILY     Cardiovascular: Diuretics - Thiazide Passed - 01/20/2023  6:34 AM      Passed - Cr in normal range and within 180 days    Creatinine  Date Value Ref Range Status  12/04/2013 0.86 0.60 - 1.30 mg/dL Final   Creatinine, Ser  Date Value Ref Range Status  11/15/2022 0.96 0.76 -  1.27 mg/dL Final         Passed - K in normal range and within 180 days    Potassium  Date Value Ref Range Status  11/15/2022 4.3 3.5 - 5.2 mmol/L Final  12/04/2013 3.6 3.5 - 5.1 mmol/L Final         Passed - Na in normal range and within 180 days    Sodium  Date Value Ref Range Status  11/15/2022 141 134 - 144 mmol/L Final  12/04/2013 141 136 - 145 mmol/L Final         Passed - Last BP in normal range    BP Readings from Last 1 Encounters:  11/12/22 135/72         Passed - Valid encounter within last 6 months    Recent Outpatient Visits           2 months ago Essential hypertension   [place] [facility] Malva Limes, MD   9 months ago Need for influenza vaccination   [facility] Malva Limes, MD  1 year ago Chronic pain of both hips   [facility] Limes, MD   1 year ago Essential hypertension   [place] [facility] Malva Limes, MD   1 year ago Acute frontal sinusitis, recurrence not specified   [place] [facility] Malva Limes, MD               meloxicam (MOBIC) 15 MG tablet [Pharmacy Med Name: MELOXICAM 15MG  TABLETS] 30 tablet 1    Sig: TAKE 1 TABLET(15 MG) BY MOUTH DAILY     Analgesics:  COX2 Inhibitors Failed - 01/20/2023  6:34 AM      Failed - Manual Review: Labs are only required if the patient has taken medication for more than 8 weeks.      Passed - HGB in normal range and within 360 days    Hemoglobin  Date Value Ref Range Status  11/15/2022 13.8 13.0 - 17.7 g/dL Final         Passed - Cr in normal range and within 360 days    Creatinine  Date Value Ref Range Status  12/04/2013 0.86 0.60 - 1.30 mg/dL Final   Creatinine, Ser  Date Value Ref Range Status  11/15/2022 0.96 0.76 - 1.27 mg/dL Final         Passed - HCT in normal range and within 360 days    Hematocrit  Date Value Ref Range Status  11/15/2022  41.4 37.5 - 51.0 % Final         Passed - AST in normal range and within 360 days    AST  Date Value Ref Range Status  11/15/2022 19 0 - 40 IU/L Final   SGOT(AST)  Date Value Ref Range Status  12/04/2013 17 15 - 37 Unit/L Final         Passed - ALT in normal range and within 360 days    ALT  Date Value Ref Range Status  11/15/2022 25 0 - 44 IU/L Final   SGPT (ALT)  Date Value Ref Range Status  12/04/2013 28 12 - 78 U/L Final         Passed - eGFR is 30 or above and within 360 days    EGFR (African American)  Date Value Ref Range Status  12/04/2013 >60  Final   GFR calc Af Amer  Date Value Ref Range Status  08/27/2020 109 >59 mL/min/1.73 Final    Comment:    **In accordance with recommendations from the NKF-ASN Task force,**   [facility] is in the process of updating its eGFR calculation to the   2021 CKD-EPI creatinine equation that estimates kidney function   without a race variable.    EGFR (Non-African Amer.)  Date Value Ref Range Status  12/04/2013 >60  Final    Comment:    eGFR values <47mL/min/1.73 m2 may be an indication of chronic kidney disease (CKD). Calculated eGFR is useful in patients with stable renal function. The eGFR calculation will not be reliable in acutely ill patients when serum creatinine is changing rapidly. It is not useful in  patients on dialysis. The eGFR calculation may not be applicable to patients at the low and high extremes of body sizes, pregnant women, and vegetarians.    GFR calc non Af Amer  Date Value Ref Range Status  08/27/2020 94 >59 mL/min/1.73 Final   eGFR  Date Value Ref Range Status  11/15/2022 90 >59 mL/min/1.73 Final  Passed - Patient is not pregnant      Passed - Valid encounter within last 12 months    Recent Outpatient Visits           2 months ago Essential hypertension   [place] [facility] Malva Limes, MD   9 months ago Need for influenza vaccination   [facility] L[facility], [facility]   1 year ago Chronic pain of both hips   [facility] Malva Limes, MD   1 year ago Essential hypertension   [place] [facility] Malva Limes, MD   1 year ago Acute frontal sinusitis, recurrence not specified   [facility] Malva Limes, MD

## 2023-01-24 ENCOUNTER — Other Ambulatory Visit: Payer: Self-pay | Admitting: Family Medicine

## 2023-01-24 DIAGNOSIS — E291 Testicular hypofunction: Secondary | ICD-10-CM

## 2023-01-24 NOTE — Telephone Encounter (Signed)
Please advise 

## 2023-03-11 ENCOUNTER — Encounter: Payer: Self-pay | Admitting: Family Medicine

## 2023-03-11 ENCOUNTER — Telehealth (INDEPENDENT_AMBULATORY_CARE_PROVIDER_SITE_OTHER): Payer: BC Managed Care – PPO | Admitting: Family Medicine

## 2023-03-11 VITALS — BP 133/70 | HR 60

## 2023-03-11 DIAGNOSIS — J069 Acute upper respiratory infection, unspecified: Secondary | ICD-10-CM

## 2023-03-11 DIAGNOSIS — R051 Acute cough: Secondary | ICD-10-CM | POA: Diagnosis not present

## 2023-03-11 MED ORDER — HYDROCODONE BIT-HOMATROP MBR 5-1.5 MG/5ML PO SOLN: 5.0000 mL | Freq: Three times a day (TID) | ORAL | 0 refills | Status: DC | PRN

## 2023-03-11 NOTE — Progress Notes (Signed)
MyChart Video Visit    Virtual Visit via Video Note   This format is felt to be most appropriate for this patient at this time. Physical exam was limited by quality of the video and audio technology used for the visit.   Patient location: home Provider location: [facility]  I discussed the limitations of evaluation and management by telemedicine and the availability of in person appointments. The patient expressed understanding and agreed to proceed.  Patient: Rodney Rojas   DOB: [birth year]   62 y.o. Male  MRN: [id number] Visit Date: 03/11/2023  Today's healthcare provider: [name], MD   Chief Complaint  Patient presents with   Cough   Subjective    Discussed the use of AI scribe software for clinical note transcription with the patient, who gave verbal consent to proceed.  History of Present Illness   Mr. Rodney Rojas presents with a week-long history of a dry, hacking cough that is primarily located in the throat and chest area. The cough is intermittent, lasting for 10-15 minutes at a time, and is not associated with any shortness of breath. The patient denies any productive cough.  The patient has also been experiencing difficulty sleeping, which he attributes to the use of over-the-counter Mucinex SinusMax. Despite this, he reports a gradual improvement in his overall condition.  In addition to the cough, the patient has been dealing with watery eyes and pressure behind the eyes, particularly during coughing episodes. There is also a mention of some sinus congestion. The patient has not been tested for COVID-19. He has no known allergies except for bee stings.       Medications: Outpatient Medications Prior to Visit  Medication Sig   amLODipine (NORVASC) 5 MG tablet TAKE 1 TABLET(5 MG) BY MOUTH DAILY   aspirin 81 MG chewable tablet Chew by mouth.   Cholecalciferol 25 MCG (1000 UT) tablet Take by mouth.   Coenzyme Q10 100 MG capsule Take by mouth.   doxycycline  (VIBRA-TABS) 100 MG tablet Take 1 tablet (100 mg total) by mouth 2 (two) times daily.   EPINEPHRINE 0.3 mg/0.3 mL IJ SOAJ injection ADMINISTER 0.3 MG IN THE MUSCLE 1 TIME FOR 1 DOSE   ezetimibe (ZETIA) 10 MG tablet TAKE 1 TABLET(10 MG) BY MOUTH DAILY   hydrochlorothiazide (HYDRODIURIL) 12.5 MG tablet TAKE 1 TABLET(12.5 MG) BY MOUTH DAILY   meloxicam (MOBIC) 15 MG tablet TAKE 1 TABLET(15 MG) BY MOUTH DAILY   primidone (MYSOLINE) 50 MG tablet Take 1-2 tablets (50-100 mg total) by mouth at bedtime. TAKE 1 TO 2 TABLETS(50 TO 100 MG) BY MOUTH AT BEDTIME   Syringe/Needle, Disp, 25G X 1" 1 ML MISC Use to inject 1 ml testosterone every 2 weeks   tamsulosin (FLOMAX) 0.4 MG CAPS capsule Take 1 capsule (0.4 mg total) by mouth as needed.   testosterone cypionate (DEPOTESTOTERONE CYPIONATE) 100 MG/ML injection INJECT 1 ML EVERY 14 DAYS   valsartan (DIOVAN) 80 MG tablet TAKE 1 TABLET(80 MG) BY MOUTH DAILY   No facility-administered medications prior to visit.       Objective     Physical Exam  Awake, alert, oriented x 3. In no apparent distress      Assessment & Plan     Assessment and Plan    Upper Respiratory Infection Persistent dry cough, watery eyes, and sinus congestion. No shortness of breath or productive cough. Likely viral etiology. -Prescribe hydrocodone/antihistamine cough syrup for symptomatic relief, particularly at night. -Continue over-the-counter Mucinex SinusMax as tolerated. -  Advise patient to contact office if symptoms worsen or new symptoms develop, particularly shortness of breath.         I discussed the assessment and treatment plan with the patient. The patient was provided an opportunity to ask questions and all were answered. The patient agreed with the plan and demonstrated an understanding of the instructions.   The patient was advised to call back or seek an in-person evaluation if the symptoms worsen or if the condition fails to improve as anticipated.  I  provided 8 minutes of non-face-to-face time during this encounter.   [name], MD [facility] [phone] (phone) [phone] (fax)  [facility]

## 2023-03-18 ENCOUNTER — Other Ambulatory Visit: Payer: Self-pay | Admitting: Family Medicine

## 2023-03-18 DIAGNOSIS — R3911 Hesitancy of micturition: Secondary | ICD-10-CM

## 2023-03-21 ENCOUNTER — Other Ambulatory Visit: Payer: Self-pay | Admitting: Family Medicine

## 2023-03-21 NOTE — Telephone Encounter (Signed)
Requested Prescriptions  Pending Prescriptions Disp Refills   tamsulosin (FLOMAX) 0.4 MG CAPS capsule [Pharmacy Med Name: TAMSULOSIN 0.4MG  CAPSULES] 30 capsule 3    Sig: TAKE 1 CAPSULE BY MOUTH EVERY DAY AS NEEDED     Urology: Alpha-Adrenergic Blocker Passed - 03/18/2023 10:20 AM      Passed - PSA in normal range and within 360 days    Prostate Specific Ag, Serum  Date Value Ref Range Status  11/15/2022 3.2 0.0 - 4.0 ng/mL Final    Comment:    Roche ECLIA methodology. According to the American Urological Association, Serum PSA should decrease and remain at undetectable levels after radical prostatectomy. The AUA defines biochemical recurrence as an initial PSA value 0.2 ng/mL or greater followed by a subsequent confirmatory PSA value 0.2 ng/mL or greater. Values obtained with different assay methods or kits cannot be used interchangeably. Results cannot be interpreted as absolute evidence of the presence or absence of malignant disease.          Passed - Last BP in normal range    BP Readings from Last 1 Encounters:  03/11/23 133/70         Passed - Valid encounter within last 12 months    Recent Outpatient Visits           1 week ago Viral upper respiratory tract infection   [place] [facility] Malva Limes, MD   4 months ago Essential hypertension   [place] [facility] Mal[facility] Limes, MD   11 months ago Need for influenza vaccination   [facility] Mal[facility] L[facility], MD   1 year ago Chronic pain of both hips   [facility] Malva Limes, MD   1 year ago Essential hypertension   [facility] Malva Limes, MD

## 2023-03-22 NOTE — Telephone Encounter (Signed)
Requested Prescriptions  Pending Prescriptions Disp Refills   meloxicam (MOBIC) 15 MG tablet [Pharmacy Med Name: MELOXICAM 15MG  TABLETS] 30 tablet 2    Sig: TAKE 1 TABLET(15 MG) BY MOUTH DAILY     Analgesics:  COX2 Inhibitors Failed - 03/21/2023  6:29 AM      Failed - Manual Review: Labs are only required if the patient has taken medication for more than 8 weeks.      Passed - HGB in normal range and within 360 days    Hemoglobin  Date Value Ref Range Status  11/15/2022 13.8 13.0 - 17.7 g/dL Final         Passed - Cr in normal range and within 360 days    Creatinine  Date Value Ref Range Status  12/04/2013 0.86 0.60 - 1.30 mg/dL Final   Creatinine, Ser  Date Value Ref Range Status  11/15/2022 0.96 0.76 - 1.27 mg/dL Final         Passed - HCT in normal range and within 360 days    Hematocrit  Date Value Ref Range Status  11/15/2022 41.4 37.5 - 51.0 % Final         Passed - AST in normal range and within 360 days    AST  Date Value Ref Range Status  11/15/2022 19 0 - 40 IU/L Final   SGOT(AST)  Date Value Ref Range Status  12/04/2013 17 15 - 37 Unit/L Final         Passed - ALT in normal range and within 360 days    ALT  Date Value Ref Range Status  11/15/2022 25 0 - 44 IU/L Final   SGPT (ALT)  Date Value Ref Range Status  12/04/2013 28 12 - 78 U/L Final         Passed - eGFR is 30 or above and within 360 days    EGFR (African American)  Date Value Ref Range Status  12/04/2013 >60  Final   GFR calc Af Amer  Date Value Ref Range Status  08/27/2020 109 >59 mL/min/1.73 Final    Comment:    **In accordance with recommendations from the NKF-ASN Task force,**   [facility] is in the process of updating its eGFR calculation to the   2021 CKD-EPI creatinine equation that estimates kidney function   without a race variable.    EGFR (Non-African Amer.)  Date Value Ref Range Status  12/04/2013 >60  Final    Comment:    eGFR values <42mL/min/1.73 m2 may be an  indication of chronic kidney disease (CKD). Calculated eGFR is useful in patients with stable renal function. The eGFR calculation will not be reliable in acutely ill patients when serum creatinine is changing rapidly. It is not useful in  patients on dialysis. The eGFR calculation may not be applicable to patients at the low and high extremes of body sizes, pregnant women, and vegetarians.    GFR calc non Af Amer  Date Value Ref Range Status  08/27/2020 94 >59 mL/min/1.73 Final   eGFR  Date Value Ref Range Status  11/15/2022 90 >59 mL/min/1.73 Final         Passed - Patient is not pregnant      Passed - Valid encounter within last 12 months    Recent Outpatient Visits           1 week ago Viral upper respiratory tract infection   [facility]   4 months ago  Essential hypertension    [facility] Malva Limes, MD   11 months ago Need for influenza vaccination   [facility] Malva Limes, MD   1 year ago Chronic pain of both hips   [place] Malva Limes, MD   1 year ago Essential hypertension   [facility] Malva Limes, MD

## 2023-04-20 ENCOUNTER — Other Ambulatory Visit: Payer: Self-pay | Admitting: Family Medicine

## 2023-04-20 DIAGNOSIS — I10 Essential (primary) hypertension: Secondary | ICD-10-CM

## 2023-05-30 DIAGNOSIS — M9904 Segmental and somatic dysfunction of sacral region: Secondary | ICD-10-CM | POA: Diagnosis not present

## 2023-05-30 DIAGNOSIS — M5441 Lumbago with sciatica, right side: Secondary | ICD-10-CM | POA: Diagnosis not present

## 2023-05-30 DIAGNOSIS — M7918 Myalgia, other site: Secondary | ICD-10-CM | POA: Diagnosis not present

## 2023-05-30 DIAGNOSIS — M9903 Segmental and somatic dysfunction of lumbar region: Secondary | ICD-10-CM | POA: Diagnosis not present

## 2023-05-31 DIAGNOSIS — M5441 Lumbago with sciatica, right side: Secondary | ICD-10-CM | POA: Diagnosis not present

## 2023-05-31 DIAGNOSIS — M9904 Segmental and somatic dysfunction of sacral region: Secondary | ICD-10-CM | POA: Diagnosis not present

## 2023-05-31 DIAGNOSIS — M7918 Myalgia, other site: Secondary | ICD-10-CM | POA: Diagnosis not present

## 2023-05-31 DIAGNOSIS — M9903 Segmental and somatic dysfunction of lumbar region: Secondary | ICD-10-CM | POA: Diagnosis not present

## 2023-06-01 DIAGNOSIS — M9903 Segmental and somatic dysfunction of lumbar region: Secondary | ICD-10-CM | POA: Diagnosis not present

## 2023-06-01 DIAGNOSIS — M5441 Lumbago with sciatica, right side: Secondary | ICD-10-CM | POA: Diagnosis not present

## 2023-06-01 DIAGNOSIS — M7918 Myalgia, other site: Secondary | ICD-10-CM | POA: Diagnosis not present

## 2023-06-01 DIAGNOSIS — M9904 Segmental and somatic dysfunction of sacral region: Secondary | ICD-10-CM | POA: Diagnosis not present

## 2023-06-02 DIAGNOSIS — M7918 Myalgia, other site: Secondary | ICD-10-CM | POA: Diagnosis not present

## 2023-06-02 DIAGNOSIS — M9903 Segmental and somatic dysfunction of lumbar region: Secondary | ICD-10-CM | POA: Diagnosis not present

## 2023-06-02 DIAGNOSIS — M9904 Segmental and somatic dysfunction of sacral region: Secondary | ICD-10-CM | POA: Diagnosis not present

## 2023-06-02 DIAGNOSIS — M5441 Lumbago with sciatica, right side: Secondary | ICD-10-CM | POA: Diagnosis not present

## 2023-06-06 DIAGNOSIS — M9903 Segmental and somatic dysfunction of lumbar region: Secondary | ICD-10-CM | POA: Diagnosis not present

## 2023-06-06 DIAGNOSIS — M7061 Trochanteric bursitis, right hip: Secondary | ICD-10-CM | POA: Diagnosis not present

## 2023-06-06 DIAGNOSIS — M47816 Spondylosis without myelopathy or radiculopathy, lumbar region: Secondary | ICD-10-CM | POA: Diagnosis not present

## 2023-06-06 DIAGNOSIS — M5441 Lumbago with sciatica, right side: Secondary | ICD-10-CM | POA: Diagnosis not present

## 2023-06-06 DIAGNOSIS — M9904 Segmental and somatic dysfunction of sacral region: Secondary | ICD-10-CM | POA: Diagnosis not present

## 2023-06-06 DIAGNOSIS — M7918 Myalgia, other site: Secondary | ICD-10-CM | POA: Diagnosis not present

## 2023-06-06 DIAGNOSIS — M1611 Unilateral primary osteoarthritis, right hip: Secondary | ICD-10-CM | POA: Diagnosis not present

## 2023-06-08 DIAGNOSIS — M9904 Segmental and somatic dysfunction of sacral region: Secondary | ICD-10-CM | POA: Diagnosis not present

## 2023-06-08 DIAGNOSIS — M5441 Lumbago with sciatica, right side: Secondary | ICD-10-CM | POA: Diagnosis not present

## 2023-06-08 DIAGNOSIS — M9903 Segmental and somatic dysfunction of lumbar region: Secondary | ICD-10-CM | POA: Diagnosis not present

## 2023-06-08 DIAGNOSIS — M7918 Myalgia, other site: Secondary | ICD-10-CM | POA: Diagnosis not present

## 2023-06-09 DIAGNOSIS — Z96653 Presence of artificial knee joint, bilateral: Secondary | ICD-10-CM | POA: Diagnosis not present

## 2023-06-16 ENCOUNTER — Other Ambulatory Visit: Payer: Self-pay | Admitting: Family Medicine

## 2023-06-16 NOTE — Telephone Encounter (Signed)
Requested Prescriptions  Pending Prescriptions Disp Refills   meloxicam (MOBIC) 15 MG tablet [Pharmacy Med Name: MELOXICAM 15MG  TABLETS] 30 tablet 2    Sig: TAKE 1 TABLET(15 MG) BY MOUTH DAILY     Analgesics:  COX2 Inhibitors Failed - 06/16/2023  6:37 AM      Failed - Manual Review: Labs are only required if the patient has taken medication for more than 8 weeks.      Passed - HGB in normal range and within 360 days    Hemoglobin  Date Value Ref Range Status  11/15/2022 13.8 13.0 - 17.7 g/dL Final         Passed - Cr in normal range and within 360 days    Creatinine  Date Value Ref Range Status  12/04/2013 0.86 0.60 - 1.30 mg/dL Final   Creatinine, Ser  Date Value Ref Range Status  11/15/2022 0.96 0.76 - 1.27 mg/dL Final         Passed - HCT in normal range and within 360 days    Hematocrit  Date Value Ref Range Status  11/15/2022 41.4 37.5 - 51.0 % Final         Passed - AST in normal range and within 360 days    AST  Date Value Ref Range Status  11/15/2022 19 0 - 40 IU/L Final   SGOT(AST)  Date Value Ref Range Status  12/04/2013 17 15 - 37 Unit/L Final         Passed - ALT in normal range and within 360 days    ALT  Date Value Ref Range Status  11/15/2022 25 0 - 44 IU/L Final   SGPT (ALT)  Date Value Ref Range Status  12/04/2013 28 12 - 78 U/L Final         Passed - eGFR is 30 or above and within 360 days    EGFR (African American)  Date Value Ref Range Status  12/04/2013 >60  Final   GFR calc Af Amer  Date Value Ref Range Status  08/27/2020 109 >59 mL/min/1.73 Final    Comment:    **In accordance with recommendations from the NKF-ASN Task force,**   [facility] is in the process of updating its eGFR calculation to the   2021 CKD-EPI creatinine equation that estimates kidney function   without a race variable.    EGFR (Non-African Amer.)  Date Value Ref Range Status  12/04/2013 >60  Final    Comment:    eGFR values <7mL/min/1.73 m2 may be an  indication of chronic kidney disease (CKD). Calculated eGFR is useful in patients with stable renal function. The eGFR calculation will not be reliable in acutely ill patients when serum creatinine is changing rapidly. It is not useful in  patients on dialysis. The eGFR calculation may not be applicable to patients at the low and high extremes of body sizes, pregnant women, and vegetarians.    GFR calc non Af Amer  Date Value Ref Range Status  08/27/2020 94 >59 mL/min/1.73 Final   eGFR  Date Value Ref Range Status  11/15/2022 90 >59 mL/min/1.73 Final         Passed - Patient is not pregnant      Passed - Valid encounter within last 12 months    Recent Outpatient Visits           3 months ago Viral upper respiratory tract infection   [facility]   7 months ago  Essential hypertension   [facility] Malva Limes, [facility]   1 year ago Need for influenza vaccination   [facility] Mal[facility] Limes, [facility]   1 year ago Chronic pain of both hips   [facility] Malva Limes, MD   1 year ago Essential hypertension   [facility] Malva Limes, MD

## 2023-06-22 DIAGNOSIS — M9903 Segmental and somatic dysfunction of lumbar region: Secondary | ICD-10-CM | POA: Diagnosis not present

## 2023-06-22 DIAGNOSIS — M7918 Myalgia, other site: Secondary | ICD-10-CM | POA: Diagnosis not present

## 2023-06-22 DIAGNOSIS — M9904 Segmental and somatic dysfunction of sacral region: Secondary | ICD-10-CM | POA: Diagnosis not present

## 2023-06-22 DIAGNOSIS — M5441 Lumbago with sciatica, right side: Secondary | ICD-10-CM | POA: Diagnosis not present

## 2023-06-23 DIAGNOSIS — I639 Cerebral infarction, unspecified: Secondary | ICD-10-CM | POA: Diagnosis not present

## 2023-06-23 DIAGNOSIS — I10 Essential (primary) hypertension: Secondary | ICD-10-CM | POA: Diagnosis not present

## 2023-06-23 DIAGNOSIS — E782 Mixed hyperlipidemia: Secondary | ICD-10-CM | POA: Diagnosis not present

## 2023-06-23 DIAGNOSIS — Z23 Encounter for immunization: Secondary | ICD-10-CM | POA: Diagnosis not present

## 2023-07-13 DIAGNOSIS — M25551 Pain in right hip: Secondary | ICD-10-CM | POA: Diagnosis not present

## 2023-07-13 DIAGNOSIS — M1611 Unilateral primary osteoarthritis, right hip: Secondary | ICD-10-CM | POA: Diagnosis not present

## 2023-07-13 DIAGNOSIS — M7061 Trochanteric bursitis, right hip: Secondary | ICD-10-CM | POA: Diagnosis not present

## 2023-07-13 DIAGNOSIS — M47816 Spondylosis without myelopathy or radiculopathy, lumbar region: Secondary | ICD-10-CM | POA: Diagnosis not present

## 2023-07-16 ENCOUNTER — Other Ambulatory Visit: Payer: Self-pay | Admitting: Family Medicine

## 2023-07-16 DIAGNOSIS — R3911 Hesitancy of micturition: Secondary | ICD-10-CM

## 2023-07-22 ENCOUNTER — Other Ambulatory Visit: Payer: Self-pay | Admitting: Family Medicine

## 2023-07-22 DIAGNOSIS — I10 Essential (primary) hypertension: Secondary | ICD-10-CM

## 2023-07-22 NOTE — Telephone Encounter (Signed)
Requested Prescriptions  Pending Prescriptions Disp Refills   valsartan (DIOVAN) 80 MG tablet [Pharmacy Med Name: VALSARTAN 80MG  TABLETS] 90 tablet 0    Sig: TAKE 1 TABLET(80 MG) BY MOUTH DAILY     Cardiovascular:  Angiotensin Receptor Blockers Failed - 07/22/2023 11:16 AM      Failed - Cr in normal range and within 180 days    Creatinine  Date Value Ref Range Status  12/04/2013 0.86 0.60 - 1.30 mg/dL Final   Creatinine, Ser  Date Value Ref Range Status  11/15/2022 0.96 0.76 - 1.27 mg/dL Final         Failed - K in normal range and within 180 days    Potassium  Date Value Ref Range Status  11/15/2022 4.3 3.5 - 5.2 mmol/L Final  12/04/2013 3.6 3.5 - 5.1 mmol/L Final         Passed - Patient is not pregnant      Passed - Last BP in normal range    BP Readings from Last 1 Encounters:  03/11/23 133/70         Passed - Valid encounter within last 6 months    Recent Outpatient Visits           4 months ago Viral upper respiratory tract infection   [facility] Malva Limes, MD   8 months ago Essential hypertension   [facility] Mal[facility] Limes, [facility]   1 year ago Need for influenza vaccination   [facility] Malva Limes, [facility]   1 year ago Chronic pain of both hips   [facility] Mal[facility] Limes, [facility]   2 years ago Essential hypertension   [facility] Malva Limes, MD               hydrochlorothiazide (HYDRODIURIL) 12.5 MG tablet [Pharmacy Med Name: HYDROCHLOROTHIAZIDE 12.5MG  TABLETS] 90 tablet 0    Sig: TAKE 1 TABLET(12.5 MG) BY MOUTH DAILY     Cardiovascular: Diuretics - Thiazide Failed - 07/22/2023 11:16 AM      Failed - Cr in normal range and within 180 days    Creatinine  Date Value Ref Range Status  12/04/2013 0.86 0.60 - 1.30 mg/dL Final   Creatinine, Ser  Date Value Ref Range Status  11/15/2022 0.96 0.76 - 1.27  mg/dL Final         Failed - K in normal range and within 180 days    Potassium  Date Value Ref Range Status  11/15/2022 4.3 3.5 - 5.2 mmol/L Final  12/04/2013 3.6 3.5 - 5.1 mmol/L Final         Failed - Na in normal range and within 180 days    Sodium  Date Value Ref Range Status  11/15/2022 141 134 - 144 mmol/L Final  12/04/2013 141 136 - 145 mmol/L Final         Passed - Last BP in normal range    BP Readings from Last 1 Encounters:  03/11/23 133/70         Passed - Valid encounter within last 6 months    Recent Outpatient Visits           4 months ago Viral upper respiratory tract infection   [facility]imes, [facility]   8 months ago Essential hypertension   [facility] Mal[facility] Limes, MD   1  year ago Need for influenza vaccination   [facility] Mal[facility] Limes, MD   1 year ago Chronic pain of both hips   [facility] Mal[facility] Limes, [facility]   2 years ago Essential hypertension   [facility] Malva [facility]imes, MD

## 2023-08-09 DIAGNOSIS — I10 Essential (primary) hypertension: Secondary | ICD-10-CM | POA: Diagnosis not present

## 2023-08-09 DIAGNOSIS — I639 Cerebral infarction, unspecified: Secondary | ICD-10-CM | POA: Diagnosis not present

## 2023-08-09 DIAGNOSIS — E782 Mixed hyperlipidemia: Secondary | ICD-10-CM | POA: Diagnosis not present

## 2023-08-09 DIAGNOSIS — R0789 Other chest pain: Secondary | ICD-10-CM | POA: Diagnosis not present

## 2023-08-15 ENCOUNTER — Other Ambulatory Visit: Payer: Self-pay | Admitting: Family Medicine

## 2023-08-15 DIAGNOSIS — I10 Essential (primary) hypertension: Secondary | ICD-10-CM

## 2023-08-16 NOTE — Telephone Encounter (Signed)
 Requested Prescriptions  Pending Prescriptions Disp Refills   amLODipine  (NORVASC ) 5 MG tablet [Pharmacy Med Name: AMLODIPINE  BESYLATE 5MG  TABLETS] 90 tablet 0    Sig: Take 1 tablet (5 mg total) by mouth daily. OFFICE VISIT NEEDED FOR ADDITIONAL REFILLS     Cardiovascular: Calcium  Channel Blockers 2 Passed - 08/16/2023  8:12 PM      Passed - Last BP in normal range    BP Readings from Last 1 Encounters:  03/11/23 133/70         Passed - Last Heart Rate in normal range    Pulse Readings from Last 1 Encounters:  03/11/23 60         Passed - Valid encounter within last 6 months    Recent Outpatient Visits           5 months ago Viral upper respiratory tract infection   [facility] Gas[facility] Nancyann BRAVO, MD   9 months ago Essential hypertension    [facility] Gasper Nancyann BRAVO, MD   1 year ago Need for influenza vaccination   [facility] Gasper Nancyann BRAVO, MD   1 year ago Chronic pain of both hips   [facility] Gasper Nancyann BRAVO, MD   2 years ago Essential hypertension   [facility] Gasper Nancyann BRAVO, MD

## 2023-08-23 DIAGNOSIS — R002 Palpitations: Secondary | ICD-10-CM | POA: Diagnosis not present

## 2023-08-31 DIAGNOSIS — R079 Chest pain, unspecified: Secondary | ICD-10-CM | POA: Diagnosis not present

## 2023-09-02 DIAGNOSIS — R002 Palpitations: Secondary | ICD-10-CM | POA: Diagnosis not present

## 2023-09-02 DIAGNOSIS — I639 Cerebral infarction, unspecified: Secondary | ICD-10-CM | POA: Diagnosis not present

## 2023-09-02 DIAGNOSIS — Z23 Encounter for immunization: Secondary | ICD-10-CM | POA: Diagnosis not present

## 2023-09-02 DIAGNOSIS — E782 Mixed hyperlipidemia: Secondary | ICD-10-CM | POA: Diagnosis not present

## 2023-09-14 ENCOUNTER — Other Ambulatory Visit: Payer: Self-pay | Admitting: Family Medicine

## 2023-09-14 DIAGNOSIS — E782 Mixed hyperlipidemia: Secondary | ICD-10-CM

## 2023-10-04 DIAGNOSIS — M9904 Segmental and somatic dysfunction of sacral region: Secondary | ICD-10-CM | POA: Diagnosis not present

## 2023-10-04 DIAGNOSIS — M5441 Lumbago with sciatica, right side: Secondary | ICD-10-CM | POA: Diagnosis not present

## 2023-10-04 DIAGNOSIS — M7918 Myalgia, other site: Secondary | ICD-10-CM | POA: Diagnosis not present

## 2023-10-04 DIAGNOSIS — M9903 Segmental and somatic dysfunction of lumbar region: Secondary | ICD-10-CM | POA: Diagnosis not present

## 2023-10-05 DIAGNOSIS — M9904 Segmental and somatic dysfunction of sacral region: Secondary | ICD-10-CM | POA: Diagnosis not present

## 2023-10-05 DIAGNOSIS — M5441 Lumbago with sciatica, right side: Secondary | ICD-10-CM | POA: Diagnosis not present

## 2023-10-05 DIAGNOSIS — M7918 Myalgia, other site: Secondary | ICD-10-CM | POA: Diagnosis not present

## 2023-10-05 DIAGNOSIS — M9903 Segmental and somatic dysfunction of lumbar region: Secondary | ICD-10-CM | POA: Diagnosis not present

## 2023-10-06 DIAGNOSIS — M9903 Segmental and somatic dysfunction of lumbar region: Secondary | ICD-10-CM | POA: Diagnosis not present

## 2023-10-06 DIAGNOSIS — M5441 Lumbago with sciatica, right side: Secondary | ICD-10-CM | POA: Diagnosis not present

## 2023-10-06 DIAGNOSIS — M7918 Myalgia, other site: Secondary | ICD-10-CM | POA: Diagnosis not present

## 2023-10-06 DIAGNOSIS — M9904 Segmental and somatic dysfunction of sacral region: Secondary | ICD-10-CM | POA: Diagnosis not present

## 2023-10-07 DIAGNOSIS — M7918 Myalgia, other site: Secondary | ICD-10-CM | POA: Diagnosis not present

## 2023-10-07 DIAGNOSIS — M9904 Segmental and somatic dysfunction of sacral region: Secondary | ICD-10-CM | POA: Diagnosis not present

## 2023-10-07 DIAGNOSIS — M9903 Segmental and somatic dysfunction of lumbar region: Secondary | ICD-10-CM | POA: Diagnosis not present

## 2023-10-07 DIAGNOSIS — M5441 Lumbago with sciatica, right side: Secondary | ICD-10-CM | POA: Diagnosis not present

## 2023-10-11 DIAGNOSIS — M5441 Lumbago with sciatica, right side: Secondary | ICD-10-CM | POA: Diagnosis not present

## 2023-10-11 DIAGNOSIS — M7918 Myalgia, other site: Secondary | ICD-10-CM | POA: Diagnosis not present

## 2023-10-11 DIAGNOSIS — M9904 Segmental and somatic dysfunction of sacral region: Secondary | ICD-10-CM | POA: Diagnosis not present

## 2023-10-11 DIAGNOSIS — M9903 Segmental and somatic dysfunction of lumbar region: Secondary | ICD-10-CM | POA: Diagnosis not present

## 2023-10-15 ENCOUNTER — Other Ambulatory Visit: Payer: Self-pay | Admitting: Family Medicine

## 2023-10-15 DIAGNOSIS — I10 Essential (primary) hypertension: Secondary | ICD-10-CM

## 2023-11-17 DIAGNOSIS — H43813 Vitreous degeneration, bilateral: Secondary | ICD-10-CM | POA: Diagnosis not present

## 2023-11-17 DIAGNOSIS — Z961 Presence of intraocular lens: Secondary | ICD-10-CM | POA: Diagnosis not present

## 2023-11-20 ENCOUNTER — Other Ambulatory Visit: Payer: Self-pay | Admitting: Family Medicine

## 2023-11-20 DIAGNOSIS — R3911 Hesitancy of micturition: Secondary | ICD-10-CM

## 2023-11-22 DIAGNOSIS — M7061 Trochanteric bursitis, right hip: Secondary | ICD-10-CM | POA: Diagnosis not present

## 2023-11-29 ENCOUNTER — Other Ambulatory Visit: Payer: Self-pay | Admitting: Family Medicine

## 2023-11-29 DIAGNOSIS — I10 Essential (primary) hypertension: Secondary | ICD-10-CM

## 2023-12-05 DIAGNOSIS — M7061 Trochanteric bursitis, right hip: Secondary | ICD-10-CM | POA: Diagnosis not present

## 2023-12-17 ENCOUNTER — Other Ambulatory Visit: Payer: Self-pay | Admitting: Family Medicine

## 2023-12-17 DIAGNOSIS — I10 Essential (primary) hypertension: Secondary | ICD-10-CM

## 2024-01-02 ENCOUNTER — Other Ambulatory Visit: Payer: Self-pay | Admitting: Family Medicine

## 2024-01-02 DIAGNOSIS — R3911 Hesitancy of micturition: Secondary | ICD-10-CM

## 2024-01-10 ENCOUNTER — Other Ambulatory Visit: Payer: Self-pay | Admitting: Family Medicine

## 2024-01-10 DIAGNOSIS — I10 Essential (primary) hypertension: Secondary | ICD-10-CM

## 2024-01-11 NOTE — Telephone Encounter (Signed)
 Last RF has notes- no appointment scheduled Requested Prescriptions  Pending Prescriptions Disp Refills   hydrochlorothiazide  (HYDRODIURIL ) 12.5 MG tablet [Pharmacy Med Name: HYDROCHLOROTHIAZIDE  12.5MG  TABLETS] 90 tablet 0    Sig: TAKE 1 TABLET(12.5 MG) BY MOUTH DAILY     Cardiovascular: Diuretics - Thiazide Failed - 01/11/2024  9:58 AM      Failed - Cr in normal range and within 180 days    Creatinine  Date Value Ref Range Status  12/04/2013 0.86 0.60 - 1.30 mg/dL Final   Creatinine, Ser  Date Value Ref Range Status  11/15/2022 0.96 0.76 - 1.27 mg/dL Final         Failed - K in normal range and within 180 days    Potassium  Date Value Ref Range Status  11/15/2022 4.3 3.5 - 5.2 mmol/L Final  12/04/2013 3.6 3.5 - 5.1 mmol/L Final         Failed - Na in normal range and within 180 days    Sodium  Date Value Ref Range Status  11/15/2022 141 134 - 144 mmol/L Final  12/04/2013 141 136 - 145 mmol/L Final         Failed - Valid encounter within last 6 months    Recent Outpatient Visits   None            Passed - Last BP in normal range    BP Readings from Last 1 Encounters:  03/11/23 133/70          valsartan  (DIOVAN ) 80 MG tablet [Pharmacy Med Name: VALSARTAN  80MG  TABLETS] 90 tablet 0    Sig: TAKE 1 TABLET(80 MG) BY MOUTH DAILY     Cardiovascular:  Angiotensin Receptor Blockers Failed - 01/11/2024  9:58 AM      Failed - Cr in normal range and within 180 days    Creatinine  Date Value Ref Range Status  12/04/2013 0.86 0.60 - 1.30 mg/dL Final   Creatinine, Ser  Date Value Ref Range Status  11/15/2022 0.96 0.76 - 1.27 mg/dL Final         Failed - K in normal range and within 180 days    Potassium  Date Value Ref Range Status  11/15/2022 4.3 3.5 - 5.2 mmol/L Final  12/04/2013 3.6 3.5 - 5.1 mmol/L Final         Failed - Valid encounter within last 6 months    Recent Outpatient Visits   None            Passed - Patient is not pregnant      Passed - Last  BP in normal range    BP Readings from Last 1 Encounters:  03/11/23 133/70

## 2024-01-11 NOTE — Telephone Encounter (Signed)
 Needs appointment

## 2024-01-16 DIAGNOSIS — L538 Other specified erythematous conditions: Secondary | ICD-10-CM | POA: Diagnosis not present

## 2024-01-16 DIAGNOSIS — B078 Other viral warts: Secondary | ICD-10-CM | POA: Diagnosis not present

## 2024-01-16 DIAGNOSIS — D2262 Melanocytic nevi of left upper limb, including shoulder: Secondary | ICD-10-CM | POA: Diagnosis not present

## 2024-01-16 DIAGNOSIS — D2271 Melanocytic nevi of right lower limb, including hip: Secondary | ICD-10-CM | POA: Diagnosis not present

## 2024-01-16 DIAGNOSIS — D2272 Melanocytic nevi of left lower limb, including hip: Secondary | ICD-10-CM | POA: Diagnosis not present

## 2024-01-16 DIAGNOSIS — R238 Other skin changes: Secondary | ICD-10-CM | POA: Diagnosis not present

## 2024-01-16 DIAGNOSIS — D485 Neoplasm of uncertain behavior of skin: Secondary | ICD-10-CM | POA: Diagnosis not present

## 2024-01-16 DIAGNOSIS — L570 Actinic keratosis: Secondary | ICD-10-CM | POA: Diagnosis not present

## 2024-01-16 DIAGNOSIS — D2261 Melanocytic nevi of right upper limb, including shoulder: Secondary | ICD-10-CM | POA: Diagnosis not present

## 2024-01-16 DIAGNOSIS — D0461 Carcinoma in situ of skin of right upper limb, including shoulder: Secondary | ICD-10-CM | POA: Diagnosis not present

## 2024-01-25 ENCOUNTER — Encounter: Admitting: Family Medicine

## 2024-01-25 ENCOUNTER — Other Ambulatory Visit: Payer: Self-pay | Admitting: Family Medicine

## 2024-01-25 DIAGNOSIS — I10 Essential (primary) hypertension: Secondary | ICD-10-CM

## 2024-01-25 NOTE — Telephone Encounter (Signed)
 Copied from CRM [id number]. Topic: Clinical - Medication Refill >> Jan 25, 2024 10:08 AM Essie A wrote: Medication: valsartan  (DIOVAN ) 80 MG tablet, hydrochlorothiazide  (HYDRODIURIL ) 12.5 MG tablet, amLODipine  (NORVASC ) 5 MG tablet  Has the patient contacted their pharmacy? No, office said to call back to get refills (Agent: If no, request that the patient contact the pharmacy for the refill. If patient does not wish to contact the pharmacy document the reason why and proceed with request.) (Agent: If yes, when and what did the pharmacy advise?)  This is the patient's preferred pharmacy:  [id number] - [place], [place] - [address] AT [facility] [address] [facility] [address] [place] [address] Phone: [phone] Fax: [phone]  Is this the correct pharmacy for this prescription? Yes If no, delete pharmacy and type the correct one.   Has the prescription been filled recently? Yes  Is the patient out of the medication? No, has enough for this week only  Has the patient been seen for an appointment in the last year OR does the patient have an upcoming appointment? Yes  Can we respond through MyChart? Yes  Agent: Please be advised that Rx refills may take up to 3 business days. We ask that you follow-up with your pharmacy.

## 2024-01-26 ENCOUNTER — Other Ambulatory Visit: Payer: Self-pay | Admitting: Family Medicine

## 2024-01-26 DIAGNOSIS — I10 Essential (primary) hypertension: Secondary | ICD-10-CM

## 2024-01-27 ENCOUNTER — Other Ambulatory Visit: Payer: Self-pay | Admitting: Family Medicine

## 2024-01-27 DIAGNOSIS — I10 Essential (primary) hypertension: Secondary | ICD-10-CM

## 2024-01-27 NOTE — Telephone Encounter (Signed)
 Requested medications are due for refill today.  yes  Requested medications are on the active medications list.  yes  Last refill. 10/16/2023 for 2 and 11/12/2023  Future visit scheduled.   yes  Notes to clinic.  Labs are expired. Pt last seen in office 11/12/2022.    Requested Prescriptions  Pending Prescriptions Disp Refills   valsartan  (DIOVAN ) 80 MG tablet 90 tablet 0     Cardiovascular:  Angiotensin Receptor Blockers Failed - 01/27/2024 12:21 PM      Failed - Cr in normal range and within 180 days    Creatinine  Date Value Ref Range Status  12/04/2013 0.86 0.60 - 1.30 mg/dL Final   Creatinine, Ser  Date Value Ref Range Status  11/15/2022 0.96 0.76 - 1.27 mg/dL Final         Failed - K in normal range and within 180 days    Potassium  Date Value Ref Range Status  11/15/2022 4.3 3.5 - 5.2 mmol/L Final  12/04/2013 3.6 3.5 - 5.1 mmol/L Final         Failed - Valid encounter within last 6 months    Recent Outpatient Visits   None            Passed - Patient is not pregnant      Passed - Last BP in normal range    BP Readings from Last 1 Encounters:  03/11/23 133/70          hydrochlorothiazide  (HYDRODIURIL ) 12.5 MG tablet 90 tablet 0    Sig: TAKE 1 TABLET(12.5 MG) BY MOUTH DAILY     Cardiovascular: Diuretics - Thiazide Failed - 01/27/2024 12:21 PM      Failed - Cr in normal range and within 180 days    Creatinine  Date Value Ref Range Status  12/04/2013 0.86 0.60 - 1.30 mg/dL Final   Creatinine, Ser  Date Value Ref Range Status  11/15/2022 0.96 0.76 - 1.27 mg/dL Final         Failed - K in normal range and within 180 days    Potassium  Date Value Ref Range Status  11/15/2022 4.3 3.5 - 5.2 mmol/L Final  12/04/2013 3.6 3.5 - 5.1 mmol/L Final         Failed - Na in normal range and within 180 days    Sodium  Date Value Ref Range Status  11/15/2022 141 134 - 144 mmol/L Final  12/04/2013 141 136 - 145 mmol/L Final         Failed - Valid encounter  within last 6 months    Recent Outpatient Visits   None            Passed - Last BP in normal range    BP Readings from Last 1 Encounters:  03/11/23 133/70          amLODipine  (NORVASC ) 5 MG tablet 20 tablet 0    Sig: Take 1 tablet (5 mg total) by mouth daily.     Cardiovascular: Calcium  Channel Blockers 2 Failed - 01/27/2024 12:21 PM      Failed - Valid encounter within last 6 months    Recent Outpatient Visits   None            Passed - Last BP in normal range    BP Readings from Last 1 Encounters:  03/11/23 133/70         Passed - Last Heart Rate in normal range    Pulse Readings from Last 1  Encounters:  03/11/23 60

## 2024-02-07 DIAGNOSIS — M7061 Trochanteric bursitis, right hip: Secondary | ICD-10-CM | POA: Diagnosis not present

## 2024-02-07 DIAGNOSIS — M25551 Pain in right hip: Secondary | ICD-10-CM | POA: Diagnosis not present

## 2024-02-07 DIAGNOSIS — M1611 Unilateral primary osteoarthritis, right hip: Secondary | ICD-10-CM | POA: Diagnosis not present

## 2024-02-07 DIAGNOSIS — M47816 Spondylosis without myelopathy or radiculopathy, lumbar region: Secondary | ICD-10-CM | POA: Diagnosis not present

## 2024-02-15 ENCOUNTER — Encounter: Payer: Self-pay | Admitting: Family Medicine

## 2024-02-15 ENCOUNTER — Ambulatory Visit: Admitting: Family Medicine

## 2024-02-15 VITALS — BP 120/81 | HR 63 | Temp 97.7°F | Resp 16 | Ht 70.0 in | Wt 210.5 lb

## 2024-02-15 DIAGNOSIS — G250 Essential tremor: Secondary | ICD-10-CM | POA: Diagnosis not present

## 2024-02-15 DIAGNOSIS — E291 Testicular hypofunction: Secondary | ICD-10-CM | POA: Diagnosis not present

## 2024-02-15 DIAGNOSIS — Z0001 Encounter for general adult medical examination with abnormal findings: Secondary | ICD-10-CM | POA: Diagnosis not present

## 2024-02-15 DIAGNOSIS — Z125 Encounter for screening for malignant neoplasm of prostate: Secondary | ICD-10-CM

## 2024-02-15 DIAGNOSIS — E782 Mixed hyperlipidemia: Secondary | ICD-10-CM

## 2024-02-15 DIAGNOSIS — G720 Drug-induced myopathy: Secondary | ICD-10-CM

## 2024-02-15 DIAGNOSIS — Z0000 Encounter for general adult medical examination without abnormal findings: Secondary | ICD-10-CM

## 2024-02-15 DIAGNOSIS — Z23 Encounter for immunization: Secondary | ICD-10-CM

## 2024-02-15 DIAGNOSIS — Z8673 Personal history of transient ischemic attack (TIA), and cerebral infarction without residual deficits: Secondary | ICD-10-CM

## 2024-02-15 DIAGNOSIS — I10 Essential (primary) hypertension: Secondary | ICD-10-CM | POA: Diagnosis not present

## 2024-02-15 NOTE — Progress Notes (Signed)
 Complete physical exam   Patient: Rodney Rojas   DOB: [birth year]   63 y.o. Male  MRN: [id number] Visit Date: 02/15/2024  Today's healthcare provider: Nancyann Perry, MD   Chief Complaint  Patient presents with   Annual Exam    Patient reports feeling well. He is exercising, walks daily. He is sleeping fairly well Patient had a steroid injection 2 weeks ago, hip bursitis   Subjective    Discussed the use of AI scribe software for clinical note transcription with the patient, who gave verbal consent to proceed.  History of Present Illness   Rodney Rojas is a 63 year old male who presents for an annual physical exam.  He is concerned about his prostate health due to a family history of prostate issues and inquires about the need for a PSA test as he no longer has a urologist.  He has not been taking his testosterone  injections for the past two months, which he previously received every two weeks. He recalls feeling better while on the treatment and is considering resuming it.  He experiences ongoing issues with bursitis in his heel, which worsens after prolonged car rides. He recently received a steroid injection at the [facility] to manage the symptoms.  He is currently taking three medications for hypertension: valsartan , hydrochlorothiazide  (HCT), and amlodipine , and confirms adherence to his medication regimen.  He has a history of stroke and is monitored with a loop recorder to check for any heart issues, though none have been found. He undergoes regular EKGs during his visits to Dr. Carnella  He reports a weight loss from 223 to 210 pounds but admits to having a poor diet, particularly a fondness for sweets. He has a family history of diabetes, as his father was affected.  He experiences occasional tinnitus and has undergone cataract surgery. He maintains regular eye exams. No current issues with vision following cataract surgery.     Lab Results   Component Value Date   CHOL 149 11/15/2022   HDL 54 11/15/2022   LDLCALC 76 11/15/2022   TRIG 105 11/15/2022   CHOLHDL 2.8 11/15/2022   Lab Results  Component Value Date   NA 141 11/15/2022   K 4.3 11/15/2022   CREATININE 0.96 11/15/2022   EGFR 90 11/15/2022   GLUCOSE 99 11/15/2022    HPI     Annual Exam    Additional comments: Patient reports feeling well. He is exercising, walks daily. He is sleeping fairly well Patient had a steroid injection 2 weeks ago, hip bursitis      Last edited by Rosas, Joseline E, CMA on 02/15/2024  9:25 AM.       Past Medical History:  Diagnosis Date   Arthritis    Cataract    Complication of anesthesia    slow to wake up after colonoscopy   GERD (gastroesophageal reflux disease)    Headache    History of chicken pox    History of kidney stones    Hypertension    Prostate enlargement    Sleep apnea    Stroke [facility])    Tremors of nervous system    Past Surgical History:  Procedure Laterality Date   CARDIOVASCULAR STRESS TEST  12/28/2013   Normal; [facility]   CATARACT EXTRACTION W/PHACO Left 10/11/2017   Procedure: CATARACT EXTRACTION PHACO AND INTRAOCULAR LENS PLACEMENT (IOC);  Surgeon: Jaye Fallow, MD;  Location: [facility] ORS;  Service: Ophthalmology;  Laterality: Left;  US  00:23.0 AP%  13.1 CDE 3.02 Fluid Pack Lot # [id number] H   CATARACT EXTRACTION W/PHACO Right 04/21/2022   Procedure: CATARACT EXTRACTION PHACO AND INTRAOCULAR LENS PLACEMENT (IOC) RIGHT 5.84 00:53.1;  Surgeon: Mittie Gaskin, MD;  Location: [facility];  Service: Ophthalmology;  Laterality: Right;   COLONOSCOPY WITH PROPOFOL  N/A 06/01/2021   Procedure: COLONOSCOPY WITH PROPOFOL ;  Surgeon: Jinny Carmine, MD;  Location: [facility];  Service: Endoscopy;  Laterality: N/A;   CYSTOSCOPY  1994   EYE SURGERY     HERNIA REPAIR  03/12/2013   umbilical hernia repair; Dr. Lorrene   JOINT REPLACEMENT     KNEE ARTHROPLASTY Right 02/25/2016   Procedure:  COMPUTER ASSISTED TOTAL KNEE ARTHROPLASTY;  Surgeon: Lynwood SHAUNNA Hue, MD;  Location: [facility] ORS;  Service: Orthopedics;  Laterality: Right;   KNEE ARTHROPLASTY Left 06/05/2018   Procedure: COMPUTER ASSISTED TOTAL KNEE ARTHROPLASTY;  Surgeon: Hue Lynwood SHAUNNA, MD;  Location: [facility] ORS;  Service: Orthopedics;  Laterality: Left;   LOOP RECORDER INSERTION N/A 03/19/2020   Procedure: LOOP RECORDER INSERTION;  Surgeon: Ammon Blunt, MD;  Location: [facility] INVASIVE CV LAB;  Service: Cardiovascular;  Laterality: N/A;   POLYPECTOMY N/A 06/01/2021   Procedure: POLYPECTOMY;  Surgeon: Jinny Carmine, MD;  Location: [facility];  Service: Endoscopy;  Laterality: N/A;   REPLACEMENT TOTAL KNEE Right    VASECTOMY  2000   Social History   Socioeconomic History   Marital status: Married    Spouse name: Not on file   Number of children: 1   Years of education: 12   Highest education level: GED or equivalent  Occupational History   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  Tobacco Use   Smoking status: Never   Smokeless tobacco: Never   Tobacco comments:    None  Vaping Use   Vaping status: Never Used  Substance and Sexual Activity   Alcohol use: Yes    Alcohol/week: 3.0 standard drinks of alcohol    Types: 1 Glasses of wine, 2 Shots of liquor per week    Comment: occasional use   Drug use: Never   Sexual activity: Not Currently    Birth control/protection: None  Other Topics Concern   Not on file  Social History Narrative   Not on file   Social Drivers of Health   Financial Resource Strain: Low Risk  (02/07/2024)   Received from [facility]   Overall Financial Resource Strain (CARDIA)    Difficulty of Paying Living Expenses: Not very hard  Food Insecurity: No Food Insecurity (02/07/2024)   Received from [facility]   Hunger Vital Sign    Within the past 12 months, you worried that your food would run out before you got the money to buy more.: Never true    Within the  past 12 months, the food you bought just didn't last and you didn't have money to get more.: Never true  Transportation Needs: No Transportation Needs (02/07/2024)   Received from [facility] - Transportation    In the past 12 months, has lack of transportation kept you from medical appointments or from getting medications?: No    Lack of Transportation (Non-Medical): No  Physical Activity: Insufficiently Active (11/11/2022)   Exercise Vital Sign    Days of Exercise per Week: 4 days    Minutes of Exercise per Session: 30 min  Stress: No Stress Concern Present (11/11/2022)   [employer] of Occupational Health - Occupational Stress Questionnaire    Feeling  of Stress : Not at all  Social Connections: Moderately Integrated (11/11/2022)   Social Connection and Isolation Panel    Frequency of Communication with Friends and Family: More than three times a week    Frequency of Social Gatherings with Friends and Family: Three times a week    Attends Religious Services: More than 4 times per year    Active Member of Clubs or Organizations: No    Attends Engineer, structural: Not on file    Marital Status: Married  Catering manager Violence: Not on file   Family Status  Relation Name Status   Mother  Alive   Father [place] Deceased   Sister  Alive   Brother  Alive   Daughter  Alive  No partnership data on file   Family History  Problem Relation Age of Onset   Heart Problems Father        had 3V CABG in his 52's   Diabetes Father    Hypertension Father    Cirrhosis Father    Arthritis Father    Allergies  Allergen Reactions   Bee Venom Anaphylaxis, Itching and Other (See Comments)    Bee stings   Atorvastatin      Muscles pains   Rosuvastatin      Muscle pain    Patient Care Team: Gasper Nancyann BRAVO, MD as PCP - General (Family Medicine) Kassie Ozell SAUNDERS, MD (Urology) Ammon Blunt, MD as Consulting Physician (Cardiology) Lane Arthea BRAVO, MD as Referring Physician (Neurology) Mittie Gaskin, MD as Referring Physician (Ophthalmology)   Medications: Outpatient Medications Prior to Visit  Medication Sig   amLODipine  (NORVASC ) 5 MG tablet TAKE 1 TABLET BY MOUTH EVERY DAY   aspirin  81 MG chewable tablet Chew by mouth.   Cholecalciferol 25 MCG (1000 UT) tablet Take by mouth.   Coenzyme Q10 100 MG capsule Take by mouth.   EPINEPHRINE  0.3 mg/0.3 mL IJ SOAJ injection ADMINISTER 0.3 MG IN THE MUSCLE 1 TIME FOR 1 DOSE   ezetimibe  (ZETIA ) 10 MG tablet TAKE 1 TABLET(10 MG) BY MOUTH DAILY   hydrochlorothiazide  (HYDRODIURIL ) 12.5 MG tablet TAKE 1 TABLET(12.5 MG) BY MOUTH DAILY   meloxicam  (MOBIC ) 15 MG tablet Take 1 tablet (15 mg total) by mouth daily as needed for pain.   primidone  (MYSOLINE ) 50 MG tablet Take 1-2 tablets (50-100 mg total) by mouth at bedtime. TAKE 1 TO 2 TABLETS(50 TO 100 MG) BY MOUTH AT BEDTIME   Syringe/Needle, Disp, 25G X 1 1 ML MISC Use to inject 1 ml testosterone  every 2 weeks   tamsulosin  (FLOMAX ) 0.4 MG CAPS capsule TAKE 1 CAPSULE BY MOUTH EVERY DAY AS NEEDED (Patient taking differently: daily as needed.)   testosterone  cypionate (DEPOTESTOTERONE CYPIONATE) 100 MG/ML injection INJECT 1 ML EVERY 14 DAYS (not taking)   valsartan  (DIOVAN ) 80 MG tablet TAKE 1 TABLET(80 MG) BY MOUTH DAILY   No facility-administered medications prior to visit.    Review of Systems  Constitutional:  Negative for appetite change, chills and fever.  Respiratory:  Negative for chest tightness, shortness of breath and wheezing.   Cardiovascular:  Negative for chest pain and palpitations.  Gastrointestinal:  Negative for abdominal pain, nausea and vomiting.      Objective    BP 120/81 (BP Location: Left Arm, Patient Position: Sitting, Cuff Size: Normal)   Pulse 63   Temp 97.7 F (36.5 C) (Oral)   Resp 16   Ht 5' 10 (1.778 m)   Wt 210 lb 8 oz (95.5  kg)   SpO2 99%   BMI 30.20 kg/m    Physical Exam  General  Appearance:    Mildly obese male. Alert, cooperative, in no acute distress, appears stated age  Head:    Normocephalic, without obvious abnormality, atraumatic  Eyes:    PERRL, conjunctiva/corneas clear, EOM's intact, fundi    benign, both eyes       Ears:    Normal TM's and external ear canals, both ears  Nose:   Nares normal, septum midline, mucosa normal, no drainage   or sinus tenderness  Throat:   Lips, mucosa, and tongue normal; teeth and gums normal  Neck:   Supple, symmetrical, trachea midline, no adenopathy;       thyroid :  No enlargement/tenderness/nodules; no carotid   bruit or JVD  Back:     Symmetric, no curvature, ROM normal, no CVA tenderness  Lungs:     Clear to auscultation bilaterally, respirations unlabored  Chest wall:    No tenderness or deformity  Heart:    Normal heart rate. Normal rhythm. No murmurs, rubs, or gallops.  S1 and S2 normal  Abdomen:     Soft, non-tender, bowel sounds active all four quadrants,    no masses, no organomegaly  Genitalia:    deferred  Rectal:    deferred  Extremities:   All extremities are intact. No cyanosis or edema  Pulses:   2+ and symmetric all extremities  Skin:   Skin color, texture, turgor normal, no rashes or lesions  Lymph nodes:   Cervical, supraclavicular, and axillary nodes normal  Neurologic:   CNII-XII intact. Normal strength, sensation and reflexes      throughout       Last depression screening scores    02/15/2024    9:25 AM 11/12/2022    2:28 PM 05/26/2021    8:37 AM  PHQ 2/9 Scores  PHQ - 2 Score 0 0 0  PHQ- 9 Score 1 1 3    Last fall risk screening    11/12/2022    2:28 PM  Fall Risk   Falls in the past year? 0  Number falls in past yr: 0  Injury with Fall? 0  Risk for fall due to : No Fall Risks  Follow up Falls evaluation completed   Last Audit-C alcohol use screening    11/12/2022    2:28 PM  Alcohol Use Disorder Test (AUDIT)  1. How often do you have a drink containing alcohol? 2  2. How many  drinks containing alcohol do you have on a typical day when you are drinking? 0  3. How often do you have six or more drinks on one occasion? 0  AUDIT-C Score 2   A score of 3 or more in women, and 4 or more in men indicates increased risk for alcohol abuse, EXCEPT if all of the points are from question 1   No results found for any visits on 02/15/24.  Assessment & Plan    Routine Health Maintenance and Physical Exam  Exercise Activities and Dietary recommendations  Goals   None     Immunization History  Administered Date(s) Administered   Influenza,inj,Quad PF,6+ Mos 06/07/2018, 08/07/2019, 05/30/2020, 05/26/2021, 04/16/2022   Influenza-Unspecified 05/21/2017   Moderna Sars-Covid-2 Vaccination 10/26/2019, 11/24/2019   PNEUMOCOCCAL CONJUGATE-20 02/15/2024   Td 06/09/1998   Tdap 05/15/2014   Zoster Recombinant(Shingrix ) 11/12/2022, 02/15/2024    Health Maintenance  Topic Date Due   Hepatitis C Screening  Never done   COVID-19  Vaccine (3 - 2024-25 season) 04/10/2023   INFLUENZA VACCINE  03/09/2024   DTaP/Tdap/Td (3 - Td or Tdap) 05/15/2024   Colonoscopy  06/01/2028   Pneumococcal Vaccine 16-40 Years old  Completed   HIV Screening  Completed   Zoster Vaccines- Shingrix   Completed   Hepatitis B Vaccines  Aged Out   HPV VACCINES  Aged Out   Meningococcal B Vaccine  Aged Out    Discussed health benefits of physical activity, and encouraged him to engage in regular exercise appropriate for his age and condition.     Annual Physical Examination Concerned about prostate health due to family history and lack of urologist. - Order PSA test. - Perform routine blood work.  Hypogonadism Off testosterone  therapy for two months. Improvement noted with previous bi-weekly injections. Plan to assess testosterone  levels and resume therapy if low. - Check testosterone  levels. - Resume testosterone  therapy if levels are low.  Hypertension On valsartan , HCT, and amlodipine . Requires  regular monitoring due to stroke history and loop recorder use. - Continue current antihypertensive medications.  Bursitis Heel bursitis exacerbated by prolonged sitting during travel. Recently received steroid injection.  General Health Maintenance Due for pneumonia and second shingles vaccines. Discussed importance due to age and travel. Poor diet with family history of diabetes necessitates dietary improvements. - Administer pneumonia vaccine. - Administer second shingles vaccine. - Counsel on dietary improvements to reduce risk of diabetes.   History CVA -intolerant to multiple statins. On ezetimibe . Most recent LDL very well controlled. Continue regular cardiology follow up.            Nancyann Perry, MD  [facility] [phone] (phone) [phone] (fax)  [facility]

## 2024-02-16 ENCOUNTER — Other Ambulatory Visit: Payer: Self-pay | Admitting: Family Medicine

## 2024-02-16 DIAGNOSIS — I10 Essential (primary) hypertension: Secondary | ICD-10-CM

## 2024-02-16 LAB — COMPREHENSIVE METABOLIC PANEL WITH GFR
ALT: 19 IU/L (ref 0–44)
AST: 17 IU/L (ref 0–40)
Albumin: 4.7 g/dL (ref 3.9–4.9)
Alkaline Phosphatase: 49 IU/L (ref 44–121)
BUN/Creatinine Ratio: 24 (ref 10–24)
BUN: 22 mg/dL (ref 8–27)
Bilirubin Total: 0.5 mg/dL (ref 0.0–1.2)
CO2: 22 mmol/L (ref 20–29)
Calcium: 9.2 mg/dL (ref 8.6–10.2)
Chloride: 103 mmol/L (ref 96–106)
Creatinine, Ser: 0.9 mg/dL (ref 0.76–1.27)
Globulin, Total: 2.1 g/dL (ref 1.5–4.5)
Glucose: 94 mg/dL (ref 70–99)
Potassium: 4.8 mmol/L (ref 3.5–5.2)
Sodium: 142 mmol/L (ref 134–144)
Total Protein: 6.8 g/dL (ref 6.0–8.5)
eGFR: 96 mL/min/1.73 (ref >59)

## 2024-02-16 LAB — CBC
Hematocrit: 40.1 % (ref 37.5–51.0)
Hemoglobin: 13.4 g/dL (ref 13.0–17.7)
MCH: 31.2 pg (ref 26.6–33.0)
MCHC: 33.4 g/dL (ref 31.5–35.7)
MCV: 93 fL (ref 79–97)
Platelets: 192 x10E3/uL (ref 150–450)
RBC: 4.3 x10E6/uL (ref 4.14–5.80)
RDW: 13.2 % (ref 11.6–15.4)
WBC: 7.5 x10E3/uL (ref 3.4–10.8)

## 2024-02-16 LAB — LIPID PANEL
Chol/HDL Ratio: 2.5 ratio (ref 0.0–5.0)
Cholesterol, Total: 152 mg/dL (ref 100–199)
HDL: 61 mg/dL (ref >39)
LDL Chol Calc (NIH): 75 mg/dL (ref 0–99)
Triglycerides: 84 mg/dL (ref 0–149)
VLDL Cholesterol Cal: 16 mg/dL (ref 5–40)

## 2024-02-16 LAB — TESTOSTERONE,FREE AND TOTAL
Testosterone, Free: 5.8 pg/mL — ABNORMAL LOW (ref 6.6–18.1)
Testosterone: 274 ng/dL (ref 264–916)

## 2024-02-16 LAB — PSA TOTAL (REFLEX TO FREE): Prostate Specific Ag, Serum: 3.1 ng/mL (ref 0.0–4.0)

## 2024-02-20 ENCOUNTER — Ambulatory Visit: Payer: Self-pay | Admitting: Family Medicine

## 2024-02-20 DIAGNOSIS — E291 Testicular hypofunction: Secondary | ICD-10-CM

## 2024-02-21 MED ORDER — TESTOSTERONE CYPIONATE 100 MG/ML IM SOLN: 100.0000 mg | INTRAMUSCULAR | 1 refills | Status: DC

## 2024-02-22 ENCOUNTER — Encounter: Admitting: Family Medicine

## 2024-03-01 ENCOUNTER — Other Ambulatory Visit: Payer: Self-pay | Admitting: Family Medicine

## 2024-03-01 NOTE — Telephone Encounter (Unsigned)
 Copied from CRM [id number]. Topic: Clinical - Medication Refill >> Mar 01, 2024  1:11 PM Antwanette L wrote: Medication: EPINEPHRINE  0.3 mg/0.3 mL IJ SOAJ injection  Has the patient contacted their pharmacy? Yes   This is the patient's preferred pharmacy:  [id number] - [place], [place] - [address] AT [facility] [address] [place] [address] Phone: [phone] Fax: [phone]  Is this the correct pharmacy for this prescription? Yes   Has the prescription been filled recently? No. Last refill was on 03/23/21  Is the patient out of the medication? Yes. The medication is expired.  Has the patient been seen for an appointment in the last year OR does the patient have an upcoming appointment? Yes. Last ov with Dr. Gasper was on 02/15/24  Can we respond through MyChart? Yes and the patient can be contacted by phone at [phone]  Agent: Please be advised that Rx refills may take up to 3 business days. We ask that you follow-up with your pharmacy.

## 2024-03-02 MED ORDER — EPINEPHRINE 0.3 MG/0.3ML IJ SOAJ: 0.3000 mg | INTRAMUSCULAR | 1 refills | Status: AC | PRN

## 2024-03-02 NOTE — Telephone Encounter (Signed)
 Requested Prescriptions  Pending Prescriptions Disp Refills   EPINEPHrine  0.3 mg/0.3 mL IJ SOAJ injection 2 each 1    Sig: Inject 0.3 mg into the muscle as needed for anaphylaxis.     Immunology: Antidotes Passed - 03/02/2024  3:26 PM      Passed - Valid encounter within last 12 months    Recent Outpatient Visits           2 weeks ago Immunization due   [facility] Gasper, Nancyann BRAVO, MD       Future Appointments             In 11 months Fisher, Nancyann BRAVO, MD [facility]

## 2024-03-05 ENCOUNTER — Other Ambulatory Visit: Payer: Self-pay | Admitting: Family Medicine

## 2024-03-29 DIAGNOSIS — L820 Inflamed seborrheic keratosis: Secondary | ICD-10-CM | POA: Diagnosis not present

## 2024-03-29 DIAGNOSIS — L538 Other specified erythematous conditions: Secondary | ICD-10-CM | POA: Diagnosis not present

## 2024-03-29 DIAGNOSIS — D0461 Carcinoma in situ of skin of right upper limb, including shoulder: Secondary | ICD-10-CM | POA: Diagnosis not present

## 2024-04-04 ENCOUNTER — Other Ambulatory Visit: Payer: Self-pay | Admitting: Family Medicine

## 2024-04-04 DIAGNOSIS — I10 Essential (primary) hypertension: Secondary | ICD-10-CM

## 2024-04-04 NOTE — Telephone Encounter (Signed)
 Patient has requested too soon, should have almost 1 month supply left.

## 2024-04-29 ENCOUNTER — Other Ambulatory Visit: Payer: Self-pay | Admitting: Family Medicine

## 2024-04-29 DIAGNOSIS — I10 Essential (primary) hypertension: Secondary | ICD-10-CM

## 2024-05-18 ENCOUNTER — Other Ambulatory Visit: Payer: Self-pay | Admitting: Family Medicine

## 2024-05-18 DIAGNOSIS — I10 Essential (primary) hypertension: Secondary | ICD-10-CM

## 2024-05-19 ENCOUNTER — Other Ambulatory Visit: Payer: Self-pay | Admitting: Family Medicine

## 2024-05-19 DIAGNOSIS — I10 Essential (primary) hypertension: Secondary | ICD-10-CM

## 2024-06-12 DIAGNOSIS — I10 Essential (primary) hypertension: Secondary | ICD-10-CM | POA: Diagnosis not present

## 2024-06-12 DIAGNOSIS — R079 Chest pain, unspecified: Secondary | ICD-10-CM | POA: Diagnosis not present

## 2024-06-12 DIAGNOSIS — R002 Palpitations: Secondary | ICD-10-CM | POA: Diagnosis not present

## 2024-06-12 DIAGNOSIS — E782 Mixed hyperlipidemia: Secondary | ICD-10-CM | POA: Diagnosis not present

## 2024-06-14 DIAGNOSIS — Z96653 Presence of artificial knee joint, bilateral: Secondary | ICD-10-CM | POA: Diagnosis not present

## 2024-07-03 ENCOUNTER — Other Ambulatory Visit: Payer: Self-pay | Admitting: Family Medicine

## 2024-07-03 DIAGNOSIS — I10 Essential (primary) hypertension: Secondary | ICD-10-CM

## 2024-07-09 ENCOUNTER — Other Ambulatory Visit: Payer: Self-pay

## 2024-07-09 ENCOUNTER — Telehealth: Payer: Self-pay | Admitting: Family Medicine

## 2024-07-09 DIAGNOSIS — I10 Essential (primary) hypertension: Secondary | ICD-10-CM

## 2024-07-09 MED ORDER — VALSARTAN 80 MG PO TABS: 80.0000 mg | ORAL_TABLET | Freq: Every day | ORAL | 0 refills | Status: DC

## 2024-07-09 NOTE — Telephone Encounter (Signed)
 Converted into a refill request

## 2024-07-09 NOTE — Telephone Encounter (Signed)
[facility] faxed refill request for the following medications:  valsartan (DIOVAN) 80 MG tablet   Please advise.  

## 2024-08-10 ENCOUNTER — Ambulatory Visit: Admitting: Podiatry

## 2024-08-10 DIAGNOSIS — B351 Tinea unguium: Secondary | ICD-10-CM | POA: Diagnosis not present

## 2024-08-10 DIAGNOSIS — D2372 Other benign neoplasm of skin of left lower limb, including hip: Secondary | ICD-10-CM

## 2024-08-10 MED ORDER — TERBINAFINE HCL 250 MG PO TABS: 250.0000 mg | ORAL_TABLET | Freq: Every day | ORAL | 1 refills | Status: AC

## 2024-08-10 MED ORDER — TERBINAFINE HCL 250 MG PO TABS: 250.0000 mg | ORAL_TABLET | Freq: Every day | ORAL | 0 refills | Status: DC

## 2024-08-10 NOTE — Progress Notes (Signed)
 "  Chief Complaint  Patient presents with   Foot Pain    Np left foot pain. He states there is a knot under his 5 toe    Subjective: 65 y.o. male presenting today as a reestablish new patient for evaluation of pain and tenderness associated to the left plantar forefoot.  Ongoing for few months now.  Last visit patient was also treated for toenail fungus.  States that he completed the oral Lamisil  and he only reports minimal improvement . Past Medical History:  Diagnosis Date   Arthritis    Cataract    Complication of anesthesia    slow to wake up after colonoscopy   GERD (gastroesophageal reflux disease)    Headache    History of chicken pox    History of kidney stones    Hypertension    Prostate enlargement    Sleep apnea    Stroke [facility])    Tremors of nervous system     Past Surgical History:  Procedure Laterality Date   CARDIOVASCULAR STRESS TEST  12/28/2013   Normal; [facility]   CATARACT EXTRACTION W/PHACO Left 10/11/2017   Procedure: CATARACT EXTRACTION PHACO AND INTRAOCULAR LENS PLACEMENT (IOC);  Surgeon: Jaye Fallow, MD;  Location: [facility] ORS;  Service: Ophthalmology;  Laterality: Left;  US  00:23.0 AP% 13.1 CDE 3.02 Fluid Pack Lot # [id number] H   CATARACT EXTRACTION W/PHACO Right 04/21/2022   Procedure: CATARACT EXTRACTION PHACO AND INTRAOCULAR LENS PLACEMENT (IOC) RIGHT 5.84 00:53.1;  Surgeon: Mittie Gaskin, MD;  Location: [facility];  Service: Ophthalmology;  Laterality: Right;   COLONOSCOPY WITH PROPOFOL  N/A 06/01/2021   Procedure: COLONOSCOPY WITH PROPOFOL ;  Surgeon: Jinny Carmine, MD;  Location: [facility];  Service: Endoscopy;  Laterality: N/A;   CYSTOSCOPY  1994   EYE SURGERY     HERNIA REPAIR  03/12/2013   umbilical hernia repair; Dr. Lorrene   JOINT REPLACEMENT     KNEE ARTHROPLASTY Right 02/25/2016   Procedure: COMPUTER ASSISTED TOTAL KNEE ARTHROPLASTY;  Surgeon: Lynwood SHAUNNA Hue, MD;  Location: [facility] ORS;  Service: Orthopedics;   Laterality: Right;   KNEE ARTHROPLASTY Left 06/05/2018   Procedure: COMPUTER ASSISTED TOTAL KNEE ARTHROPLASTY;  Surgeon: Hue Lynwood SHAUNNA, MD;  Location: [facility] ORS;  Service: Orthopedics;  Laterality: Left;   LOOP RECORDER INSERTION N/A 03/19/2020   Procedure: LOOP RECORDER INSERTION;  Surgeon: Ammon Blunt, MD;  Location: [facility] INVASIVE CV LAB;  Service: Cardiovascular;  Laterality: N/A;   POLYPECTOMY N/A 06/01/2021   Procedure: POLYPECTOMY;  Surgeon: Jinny Carmine, MD;  Location: [facility];  Service: Endoscopy;  Laterality: N/A;   REPLACEMENT TOTAL KNEE Right    VASECTOMY  2000    Allergies[1]   B/L toenails 08/10/2024  Objective: Physical Exam General: The patient is alert and oriented x3 in no acute distress.  Dermatology: Hyperkeratotic, discolored, thickened, onychodystrophy noted. Skin is warm, dry and supple bilateral lower extremities. Negative for open lesions or macerations. Hyperkeratotic skin lesion noted with a central necrotic core to plantar aspect of the fifth MTP left foot with associated tenderness  Vascular: Palpable pedal pulses bilaterally. No edema or erythema noted. Capillary refill within normal limits.  Neurological: Grossly intact via light touch  Musculoskeletal Exam: No pedal deformity noted  Assessment: #1 Onychomycosis of toenails bilateral great toes #2 eccrine poroma left  Plan of Care:  -Patient was evaluated. -Today we discussed different treatment options including oral, topical, and laser antifungal treatment modalities.  We discussed their efficacies and side effects.  Patient opts for  oral antifungal treatment modality -Prescription for Lamisil  250 mg #90 daily. Pt denies a history of liver pathology or symptoms.   -Excisional debridement of the hyperkeratotic skin lesion was performed today using 312 scalpel.  Salicylic acid applied -Recommend OTC salicylic acid x 3 weeks then -Return to clinic 6 months for reevaluation of the  toenail infection   Thresa EMERSON Sar, DPM [facility]  Dr. Thresa EMERSON Sar, DPM    [address] [place], [place] [address]                Office [phone]  Fax [phone]        [1]  Allergies Allergen Reactions   Bee Venom Anaphylaxis, Itching and Other (See Comments)    Bee stings   Atorvastatin      Muscles pains   Rosuvastatin      Muscle pain   "

## 2024-08-29 ENCOUNTER — Encounter: Payer: Self-pay | Admitting: Podiatry

## 2024-08-30 ENCOUNTER — Other Ambulatory Visit: Payer: Self-pay | Admitting: Family Medicine

## 2024-08-30 DIAGNOSIS — I10 Essential (primary) hypertension: Secondary | ICD-10-CM

## 2024-09-07 ENCOUNTER — Other Ambulatory Visit: Payer: Self-pay | Admitting: Family Medicine

## 2024-09-07 DIAGNOSIS — I10 Essential (primary) hypertension: Secondary | ICD-10-CM

## 2024-09-11 ENCOUNTER — Encounter: Payer: Self-pay | Admitting: Family Medicine

## 2024-09-12 NOTE — Telephone Encounter (Signed)
 Needs appointment schedule please

## 2024-09-14 ENCOUNTER — Ambulatory Visit (INDEPENDENT_AMBULATORY_CARE_PROVIDER_SITE_OTHER): Admitting: Family Medicine

## 2024-09-14 ENCOUNTER — Ambulatory Visit: Payer: Self-pay

## 2024-09-14 VITALS — BP 132/78 | HR 92 | Resp 16 | Ht 70.0 in | Wt 216.0 lb

## 2024-09-14 DIAGNOSIS — R5383 Other fatigue: Secondary | ICD-10-CM

## 2024-09-14 DIAGNOSIS — K5900 Constipation, unspecified: Secondary | ICD-10-CM

## 2024-09-14 DIAGNOSIS — K219 Gastro-esophageal reflux disease without esophagitis: Secondary | ICD-10-CM

## 2024-09-14 LAB — COMPREHENSIVE METABOLIC PANEL WITH GFR
AG Ratio: 2.5 (calc) (ref 1.0–2.5)
ALT: 20 U/L (ref 9–46)
AST: 14 U/L (ref 10–35)
Albumin: 5 g/dL (ref 3.6–5.1)
Alkaline phosphatase (APISO): 52 U/L (ref 35–144)
BUN: 17 mg/dL (ref 7–25)
CO2: 28 mmol/L (ref 20–32)
Calcium: 9.6 mg/dL (ref 8.6–10.3)
Chloride: 101 mmol/L (ref 98–110)
Creat: 0.95 mg/dL (ref 0.70–1.35)
Globulin: 2 g/dL (ref 1.9–3.7)
Glucose, Bld: 124 mg/dL — ABNORMAL HIGH (ref 65–99)
Potassium: 3.8 mmol/L (ref 3.5–5.3)
Sodium: 140 mmol/L (ref 135–146)
Total Bilirubin: 0.4 mg/dL (ref 0.2–1.2)
Total Protein: 7 g/dL (ref 6.1–8.1)
eGFR: 90 mL/min/{1.73_m2}

## 2024-09-14 LAB — CBC WITH DIFFERENTIAL/PLATELET
Absolute Lymphocytes: 2142 {cells}/uL (ref 850–3900)
Absolute Monocytes: 417 {cells}/uL (ref 200–950)
Basophils Absolute: 34 {cells}/uL (ref 0–200)
Basophils Relative: 0.4 %
Eosinophils Absolute: 162 {cells}/uL (ref 15–500)
Eosinophils Relative: 1.9 %
HCT: 37.9 % — ABNORMAL LOW (ref 39.4–51.1)
Hemoglobin: 13.2 g/dL (ref 13.2–17.1)
MCH: 31.3 pg (ref 27.0–33.0)
MCHC: 34.8 g/dL (ref 31.6–35.4)
MCV: 89.8 fL (ref 81.4–101.7)
MPV: 10.3 fL (ref 7.5–12.5)
Monocytes Relative: 4.9 %
Neutro Abs: 5746 {cells}/uL (ref 1500–7800)
Neutrophils Relative %: 67.6 %
Platelets: 224 10*3/uL (ref 140–400)
RBC: 4.22 Million/uL (ref 4.20–5.80)
RDW: 12.7 % (ref 11.0–15.0)
Total Lymphocyte: 25.2 %
WBC: 8.5 10*3/uL (ref 3.8–10.8)

## 2024-09-14 MED ORDER — PANTOPRAZOLE SODIUM 20 MG PO TBEC: 20.0000 mg | DELAYED_RELEASE_TABLET | Freq: Every day | ORAL | 0 refills | Status: AC | PRN

## 2024-09-14 NOTE — Patient Instructions (Signed)
 Start over the counter fiber supplement. Metamucil is a good option.   Start over the counter Miralax once a day as needed for constipation. Goal is to have one soft, formed bowel movement once every other day.

## 2024-09-14 NOTE — Telephone Encounter (Signed)
 FYI Only or Action Required?: FYI only for provider: appointment scheduled on 09/14/24.  Patient was last seen in primary care on 02/15/2024 by Gasper Nancyann BRAVO, MD.  Called Nurse Triage reporting Heartburn.  Symptoms began several weeks ago.  Interventions attempted: OTC medications: TUMS.  Symptoms are: unchanged.  Triage Disposition: See Physician Within 24 Hours  Patient/caregiver understands and will follow disposition?:  Reason for Disposition  Nursing judgment or information in reference  Answer Assessment - Initial Assessment Questions Pt was advised by PCP to schedule OV d/t heartburn x 2 weeks and headaches x 1 1/2 weeks. Headaches resolved 2-3 days ago. Heartburn is intermittent, worse with spicy foods. Pt is taking TUMS with temporary improvement.  1. REASON FOR CALL: What is your main concern right now?     Heartburn 2. ONSET:      2 weeks 3. SEVERITY:      7-8/10 5. RELIEVING AND AGGRAVATING FACTORS: What makes it better or worse? (e.g., certain activities, rest)     TUMS temporarily  Protocols used: No Guideline Available-A-AH

## 2024-09-14 NOTE — Assessment & Plan Note (Addendum)
 Uncontrolled GERD, see HPI for additional details.   -Start Pantoprazole  20mg  once daily as needed for heartburn/ indigestion -Discussed that constipation can worsen GERD symptoms. Management of constipation as noted below. -Discussed lifestyle changes for management of GERD which includes the following: avoiding trigger foods and beverages (common trigger foods and beverages are tomato-based foods/products, spicy foods, chocolate, carbonated beverages, coffee, and alcohol), eating smaller meals and avoiding large meals especially prior to bedtime, staying upright during and after meals for at least one hour, head of bed elevation at night, avoiding high-fat meals, avoiding excessive or increased caffeine intake (coffee, tea, soda), refrain from smoking and tobacco usage  -Red flag symptoms/ ED precautions advised -Return for close follow up with PCP in one month for re-evaluation. He is agreeable. Orders:   pantoprazole  (PROTONIX ) 20 MG tablet; Take 1 tablet (20 mg total) by mouth daily as needed for heartburn or indigestion.

## 2024-09-14 NOTE — Progress Notes (Signed)
 "  Acute Office Visit  Subjective:     Patient ID: Rodney Rojas, male    DOB: [birth year], 64 y.o.   MRN: [id number]  Chief Complaint  Patient presents with   Heartburn    x2.5 weeks, worsening. No changes in diet     Patient is in today for concerns of heartburn and indigestion. He is a new patient to me. He voices symptoms started in the last 3 weeks. He voices he has had to take a lot of OTC Tums and sees relief in GERD, but requires multiple doses. He voices prior to 3 weeks ago he was not experiencing indigestion. He voices he experiences acid reflux and indigestion after every meal no matter what he eats. He had previously struggled with GERD in the past, however he was able to modify diet and control symptoms. Denies chest pain or shortness of breath. Denies nausea or vomiting. He also endorses new onset constipation starting in the last 2 to 3 weeks. He voices normally he is not constipated. He admits that he might not drink enough water  contributing to constipation. Last bowel movement was today and he states he was really constipated. He voices the stool was hard though moderate amount. He voices straining with bowel movement. Denies presence of blood in the stool. Denies fever. He denies associated nausea or vomiting.  Denies associated abdominal pain.  He does endorse new onset fatigue starting in the last couple of weeks. He does admit to increase in caffeine intake recently since he has been more tired. He denies other diet changes. Discussed that I suspect increase in coffee and caffeine intake has contributed to GERD symptoms as described.  Review of Systems  Constitutional:  Positive for malaise/fatigue. Negative for fever and weight loss.  Respiratory:  Negative for shortness of breath.   Cardiovascular:  Negative for chest pain.  Gastrointestinal:  Positive for constipation and heartburn. Negative for abdominal pain, blood in stool, nausea and vomiting.         Objective:    BP 132/78   Pulse 92   Resp 16   Ht 5' 10 (1.778 m)   Wt 216 lb (98 kg)   SpO2 98%   BMI 30.99 kg/m  BP Readings from Last 3 Encounters:  09/14/24 132/78  02/15/24 120/81  03/11/23 133/70   Wt Readings from Last 3 Encounters:  09/14/24 216 lb (98 kg)  02/15/24 210 lb 8 oz (95.5 kg)  11/12/22 220 lb (99.8 kg)        Physical Exam Constitutional:      Appearance: He is not ill-appearing or toxic-appearing.  Cardiovascular:     Rate and Rhythm: Normal rate and regular rhythm.     Heart sounds: Normal heart sounds.  Pulmonary:     Effort: Pulmonary effort is normal. No respiratory distress.     Breath sounds: Normal breath sounds.  Abdominal:     General: Bowel sounds are decreased.     Tenderness: There is abdominal tenderness in the right lower quadrant and left lower quadrant.     Comments: LUQ and RUQ non-tender to palpation  Skin:    General: Skin is warm and dry.  Neurological:     General: No focal deficit present.     Mental Status: He is alert.  Psychiatric:        Mood and Affect: Mood normal.        Behavior: Behavior normal.        Assessment & Plan:  Assessment & Plan Gastroesophageal reflux disease, unspecified whether esophagitis present Uncontrolled GERD, see HPI for additional details.   -Start Pantoprazole  20mg  once daily as needed for heartburn/ indigestion -Discussed that constipation can worsen GERD symptoms. Management of constipation as noted below. -Discussed lifestyle changes for management of GERD which includes the following: avoiding trigger foods and beverages (common trigger foods and beverages are tomato-based foods/products, spicy foods, chocolate, carbonated beverages, coffee, and alcohol), eating smaller meals and avoiding large meals especially prior to bedtime, staying upright during and after meals for at least one hour, head of bed elevation at night, avoiding high-fat meals, avoiding excessive or increased  caffeine intake (coffee, tea, soda), refrain from smoking and tobacco usage  -Red flag symptoms/ ED precautions advised -Return for close follow up with PCP in one month for re-evaluation. He is agreeable. Orders:   pantoprazole  (PROTONIX ) 20 MG tablet; Take 1 tablet (20 mg total) by mouth daily as needed for heartburn or indigestion.  Constipation, unspecified constipation type New onset constipation starting in the last 2 to 3 weeks. Denies associated nausea, vomiting, abdominal pain, blood in the stool, unintentional weight loss, or fever. He does endorse associated fatigue, however suspect that fatigue with multifactorial etiology.  He does admit to poor water  intake, increased caffeine intake.   Decreased bowel sounds at time of exam. RLQ and LLQ tenderness with palpation. RUQ and LUQ non-tender to palpation.   -Recommended decreasing caffeine intake and increasing water  intake -Recommended starting OTC fiber supplement daily -Recommended OTC Miralax once daily as needed for constipation. Advised goal for bowel movements is one soft, formed bowel movement every other day.  -Red flag symptoms/ ED precautions advised. -Advised close follow up with PCP in one month for re-evaluation of symptoms.  Orders:   TSH  Other fatigue Patient complains of fatigue for the last 2 to 3 weeks.  Suspect multifactorial etiology. Previously on testosterone  replacement but reports he has been off of this for the last 6 months.   -Update labs, see below. Rule out anemia, electrolyte imbalance or thyroid  disorder as causes of fatigue -Recommended 1 month f/u with PCP for ongoing evaluation and management of symptoms. He is agreeable.  Orders:   CBC w/Diff/Platelet   Comprehensive Metabolic Panel (CMET)   TSH     Return in about 1 month (around 10/12/2024) for with PCP for GERD and fatigue follow up .  LAYMON LOISE CORE, FNP  "

## 2024-09-18 ENCOUNTER — Ambulatory Visit: Admitting: Podiatry

## 2025-02-05 ENCOUNTER — Ambulatory Visit: Admitting: Podiatry

## 2025-02-15 ENCOUNTER — Encounter: Admitting: Family Medicine
